# Patient Record
Sex: Female | Born: 1950 | Race: White | Hispanic: No | Marital: Married | Smoking: Former smoker
Health system: Southern US, Community
[De-identification: ages and names within clinical notes are randomized; demographics above are authoritative.]

## PROBLEM LIST (undated history)

## (undated) DIAGNOSIS — I255 Ischemic cardiomyopathy: Secondary | ICD-10-CM

## (undated) DIAGNOSIS — E119 Type 2 diabetes mellitus without complications: Secondary | ICD-10-CM

## (undated) DIAGNOSIS — I10 Essential (primary) hypertension: Secondary | ICD-10-CM

## (undated) DIAGNOSIS — R011 Cardiac murmur, unspecified: Secondary | ICD-10-CM

## (undated) DIAGNOSIS — M2550 Pain in unspecified joint: Secondary | ICD-10-CM

## (undated) DIAGNOSIS — I2510 Atherosclerotic heart disease of native coronary artery without angina pectoris: Secondary | ICD-10-CM

## (undated) DIAGNOSIS — G459 Transient cerebral ischemic attack, unspecified: Secondary | ICD-10-CM

## (undated) DIAGNOSIS — E785 Hyperlipidemia, unspecified: Secondary | ICD-10-CM

## (undated) DIAGNOSIS — I639 Cerebral infarction, unspecified: Secondary | ICD-10-CM

## (undated) DIAGNOSIS — K219 Gastro-esophageal reflux disease without esophagitis: Secondary | ICD-10-CM

## (undated) HISTORY — DX: Essential (primary) hypertension: I10

## (undated) HISTORY — DX: Gastro-esophageal reflux disease without esophagitis: K21.9

## (undated) HISTORY — DX: Ischemic cardiomyopathy: I25.5

## (undated) HISTORY — PX: CARDIAC CATHETERIZATION: SHX172

## (undated) HISTORY — DX: Type 2 diabetes mellitus without complications: E11.9

## (undated) HISTORY — DX: Atherosclerotic heart disease of native coronary artery without angina pectoris: I25.10

## (undated) HISTORY — DX: Cerebral infarction, unspecified: I63.9

## (undated) HISTORY — DX: Pain in unspecified joint: M25.50

## (undated) HISTORY — DX: Hyperlipidemia, unspecified: E78.5

## (undated) HISTORY — DX: Cardiac murmur, unspecified: R01.1

---
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
## 1986-12-12 HISTORY — PX: BREAST BIOPSY: SHX20

## 2006-06-15 ENCOUNTER — Emergency Department: Payer: Self-pay | Admitting: General Practice

## 2006-06-16 ENCOUNTER — Other Ambulatory Visit: Payer: Self-pay

## 2006-12-29 ENCOUNTER — Ambulatory Visit: Payer: Self-pay | Admitting: Internal Medicine

## 2011-01-14 ENCOUNTER — Ambulatory Visit: Payer: Self-pay | Admitting: Gastroenterology

## 2011-01-17 LAB — PATHOLOGY REPORT

## 2014-07-04 ENCOUNTER — Ambulatory Visit: Payer: Self-pay | Admitting: Internal Medicine

## 2014-07-22 LAB — URINALYSIS, COMPLETE
BACTERIA: NONE SEEN
BILIRUBIN, UR: NEGATIVE
Blood: NEGATIVE
Glucose,UR: NEGATIVE mg/dL (ref 0–75)
Ketone: NEGATIVE
NITRITE: NEGATIVE
Ph: 6 (ref 4.5–8.0)
Protein: NEGATIVE
Specific Gravity: 1.004 (ref 1.003–1.030)
Squamous Epithelial: 1

## 2014-07-22 LAB — COMPREHENSIVE METABOLIC PANEL
ALK PHOS: 61 U/L
ALT: 26 U/L
ANION GAP: 10 (ref 7–16)
Albumin: 3.6 g/dL (ref 3.4–5.0)
BILIRUBIN TOTAL: 0.2 mg/dL (ref 0.2–1.0)
BUN: 10 mg/dL (ref 7–18)
CALCIUM: 9.1 mg/dL (ref 8.5–10.1)
CO2: 26 mmol/L (ref 21–32)
Chloride: 107 mmol/L (ref 98–107)
Creatinine: 0.73 mg/dL (ref 0.60–1.30)
EGFR (African American): >60
GLUCOSE: 149 mg/dL — AB (ref 65–99)
OSMOLALITY: 287 (ref 275–301)
Potassium: 4.5 mmol/L (ref 3.5–5.1)
SGOT(AST): 25 U/L (ref 15–37)
SODIUM: 143 mmol/L (ref 136–145)
TOTAL PROTEIN: 6.9 g/dL (ref 6.4–8.2)

## 2014-07-22 LAB — PROTIME-INR
INR: 1
PROTHROMBIN TIME: 12.6 s (ref 11.5–14.7)

## 2014-07-22 LAB — CBC
HCT: 37.3 % (ref 35.0–47.0)
HGB: 12.3 g/dL (ref 12.0–16.0)
MCH: 29.2 pg (ref 26.0–34.0)
MCHC: 33 g/dL (ref 32.0–36.0)
MCV: 89 fL (ref 80–100)
Platelet: 190 10*3/uL (ref 150–440)
RBC: 4.21 10*6/uL (ref 3.80–5.20)
RDW: 13.9 % (ref 11.5–14.5)
WBC: 6.2 10*3/uL (ref 3.6–11.0)

## 2014-07-22 LAB — TSH: Thyroid Stimulating Horm: 4.41 u[IU]/mL

## 2014-07-22 LAB — CK TOTAL AND CKMB (NOT AT ARMC)
CK, TOTAL: 73 U/L
CK-MB: <0.5 ng/mL — ABNORMAL LOW (ref 0.5–3.6)

## 2014-07-22 LAB — D-DIMER(ARMC): D-Dimer: 264 ng/ml

## 2014-07-22 LAB — TROPONIN I

## 2014-07-23 ENCOUNTER — Observation Stay: Payer: Self-pay | Admitting: Internal Medicine

## 2014-07-23 DIAGNOSIS — R55 Syncope and collapse: Secondary | ICD-10-CM

## 2014-07-23 LAB — LIPID PANEL
Cholesterol: 258 mg/dL — ABNORMAL HIGH (ref 0–200)
HDL Cholesterol: 33 mg/dL — ABNORMAL LOW (ref 40–60)
TRIGLYCERIDES: 640 mg/dL — AB (ref 0–200)

## 2014-07-23 LAB — TROPONIN I: Troponin-I: <0.02 ng/mL

## 2014-07-23 LAB — CK-MB
CK-MB: <0.5 ng/mL — ABNORMAL LOW (ref 0.5–3.6)
CK-MB: <0.5 ng/mL — ABNORMAL LOW (ref 0.5–3.6)

## 2014-08-12 DIAGNOSIS — I639 Cerebral infarction, unspecified: Secondary | ICD-10-CM

## 2014-08-12 DIAGNOSIS — G459 Transient cerebral ischemic attack, unspecified: Secondary | ICD-10-CM

## 2014-08-12 HISTORY — DX: Transient cerebral ischemic attack, unspecified: G45.9

## 2014-08-12 HISTORY — DX: Cerebral infarction, unspecified: I63.9

## 2014-10-29 ENCOUNTER — Encounter: Payer: Self-pay | Admitting: General Surgery

## 2014-11-04 ENCOUNTER — Ambulatory Visit (INDEPENDENT_AMBULATORY_CARE_PROVIDER_SITE_OTHER): Payer: PRIVATE HEALTH INSURANCE | Admitting: General Surgery

## 2014-11-04 ENCOUNTER — Encounter: Payer: Self-pay | Admitting: General Surgery

## 2014-11-04 VITALS — BP 144/78 | HR 80 | Resp 12 | Ht 64.0 in | Wt 155.0 lb

## 2014-11-04 DIAGNOSIS — D4861 Neoplasm of uncertain behavior of right breast: Secondary | ICD-10-CM

## 2014-11-04 DIAGNOSIS — R928 Other abnormal and inconclusive findings on diagnostic imaging of breast: Secondary | ICD-10-CM

## 2014-11-04 NOTE — Patient Instructions (Addendum)
Stereotactic Breast Biopsy A stereotactic breast biopsy is a procedure in which mammography is used in the collection of a sample of breast tissue. Mammography is a type of X-ray exam of the breasts that produces an image called a mammogram. The mammogram allows your health care provider to precisely locate the area of the breast from which a tissue sample will be taken. The tissue is then examined under a microscope to see if cancerous cells are present. A breast biopsy is done when:   A lump, abnormality, or mass is seen in the breast on a breast X-ray (mammogram).   Small calcium deposits (calcifications) are seen in the breast.   The shape or appearance of the breasts changes.   The shape or appearance of the nipples changes. You may have unusual or bloody discharge coming from the nipples, or you may have crusting, retraction, or dimpling of the nipples. A breast biopsy can indicate if you need surgery or other treatment.  LET YOUR HEALTH CARE PROVIDER KNOW ABOUT:  Any allergies you have.  All medicines you are taking, including vitamins, herbs, eye drops, creams, and over-the-counter medicines.  Previous problems you or members of your family have had with the use of anesthetics.  Any blood disorders you have.  Previous surgeries you have had.  Medical conditions you have. RISKS AND COMPLICATIONS Generally, stereotactic breast biopsy is a safe procedure. However, as with any procedure, complications can occur. Possible complications include:  Infection at the needle-insertion site.   Bleeding or bruising after surgery.  The breast may become altered or deformed as a result of the procedure.  The needle may go through the chest wall into the lung area.  BEFORE THE PROCEDURE  Wear a supportive bra to the procedure.  You will be asked to remove jewelry, dentures, eyeglasses, metal objects, or clothing that might interfere with the X-ray images. You may want to leave  some of these objects at home.  Arrange for someone to drive you home after the procedure if desired. PROCEDURE  A stereotactic breast biopsy is done while you are awake. During the procedure, relax as much as possible. Let your health care provider know if you are uncomfortable, anxious, or in pain. Usually, the only discomfort felt during the procedure is caused by staying in one position for the length of the procedure. This discomfort can be reduced by carefully placed cushions. Most of the time the biopsy is done using a table with openings on it. You will be asked to lie facedown on the table and place your breasts through the openings. Your breast is compressed between metal plates to get good X-ray images. Your skin will be cleaned, and a numbing medicine (local anesthetic) will be injected. A small cut (incision) will be made in your breast. The tip of the biopsy needle will be directed through the incision. Several small pieces of suspicious tissue will be taken. Then, a final set of X-ray images will be obtained. If they show that the suspicious tissue has been mostly or completely removed, a small clip will be left at the biopsy site. This is done so that the biopsy site can be easily located if the results of the biopsy show that the tissue is cancerous.  After the procedure, the incision will be stitched (sutured) or taped and covered with a bandage (dressing). Your health care provider may apply a pressure dressing and an ice pack to prevent bleeding and swelling in the breast.  A stereotactic   breast biopsy can take 30 minutes or more. AFTER THE PROCEDURE  If you are doing well and have no problems, you will be allowed to go home.  Document Released: 08/27/2003 Document Revised: 12/03/2013 Document Reviewed: 06/27/2013 [facility] Patient Information 2015 [place], [place]. This information is not intended to replace advice given to you by your health care provider. Make sure you discuss any  questions you have with your health care provider.  Patient has been scheduled for a right breast stereotactic biopsy at [facility] for 11/10/14 at 3 pm. She will check-in at the [facility] at 2:30 pm. This patient is aware of date, time, and instructions. Patient verbalizes understanding.

## 2014-11-04 NOTE — Progress Notes (Signed)
Patient ID: Donna Harper, female   DOB: Nov 04, 1951, 63 y.o.   MRN: [id number]  Chief Complaint  Patient presents with  . Breast Problem    abnormal mammogram    HPI Donna Harper is a 63 y.o. female.  who presents for a breast evaluation. The most recent mammogram was done last week at [facility].  Patient does perform regular self breast checks and gets regular mammograms done at [facility].  Denies family history of breast cancer. She does state that last year she injured the right breast/chest on a file cabinet. She does states the right breast has an occasional dull ache for about 2 weeks.  She can feel a little some different in the right breast as well for about 2 weeks.  HPI  Past Medical History  Diagnosis Date  . Stroke Sept 2015    TIA  . Diabetes mellitus without complication   . GERD (gastroesophageal reflux disease)   . Joint pain   . Murmur     Past Surgical History  Procedure Laterality Date  . Cesarean section  1986  . Breast biopsy Bilateral 1988    benign/Dr Evette Cristal    Family History  Problem Relation Age of Onset  . COPD Mother   . COPD Father     Social History History  Substance Use Topics  . Smoking status: Former Games developer  . Smokeless tobacco: Never Used  . Alcohol Use: No    Allergies  Allergen Reactions  . Erythromycin     Stomach Problems'   . Levaquin [Levofloxacin In D5w]   . Penicillins     Heart races  . Tequin [Gatifloxacin]   . Zoloft [Sertraline Hcl]   . Zithromax [Azithromycin] Palpitations    Heart race    Current Outpatient Prescriptions  Medication Sig Dispense Refill  . aspirin 81 MG tablet Take 81 mg by mouth daily.    . cholecalciferol (VITAMIN D) 1000 UNITS tablet Take 1,000 Units by mouth daily.    . colesevelam (WELCHOL) 625 MG tablet Take 312.5 mg by mouth daily with breakfast.    . diphenhydrAMINE (BENADRYL) 25 MG tablet Take 25 mg by mouth at bedtime.    [name] GLIPIZIDE XL 2.5 MG 24 hr tablet Take 2.5 mg by mouth  daily with breakfast.   0  . losartan (COZAAR) 50 MG tablet Take 50 mg by mouth daily.   0  . metFORMIN (GLUCOPHAGE) 1000 MG tablet Take 1,000 mg by mouth 2 (two) times daily with a meal.   0  . NASONEX 50 MCG/ACT nasal spray Place 2 sprays into the nose daily.   0  . nystatin (MYCOSTATIN) 100000 UNIT/ML suspension Take 5 mLs by mouth as needed.     No current facility-administered medications for this visit.    Review of Systems Review of Systems  Constitutional: Negative.   Respiratory: Negative.   Cardiovascular: Negative.     Blood pressure 144/78, pulse 80, resp. rate 12, height 5\' 4"  (1.626 m), weight 155 lb (70.308 kg).  Physical Exam Physical Exam  Constitutional: She is oriented to person, place, and time. She appears well-developed and well-nourished.  Neck: Neck supple.  Cardiovascular: Normal rate and regular rhythm.   Murmur heard.  Systolic murmur is present with a grade of 1/6  Pulmonary/Chest: Effort normal and breath sounds normal. Right breast exhibits no inverted nipple, no mass, no nipple discharge, no skin change and no tenderness. Left breast exhibits no inverted nipple, no mass, no nipple discharge,  no skin change and no tenderness.    Focal thickening at 1 o'clock 6 CFN right breast.  Lymphadenopathy:    She has no cervical adenopathy.    She has no axillary adenopathy.  Neurological: She is alert and oriented to person, place, and time.  Skin: Skin is warm and dry.    Data Reviewed Bilateral mammogram and right breast ultrasound dated 10/28/2014 completed at [facility] were reviewed. Breasts were described as almost entirely fatty replaced. Left breast was unremarkable. Right breast showed focal thickening with multiple oil cyst up to 1 cm in diameter, these were superficially located. There was a 1.3 cm focal asymmetry in the upper medial breast that was not identified sonographically. BI-RADS-4.  Assessment    Likely soft tissue trauma secondary to the  significant fall experienced by the patient, unable to exclude malignancy based on present imaging.    Plan    The patient's exceedingly anxious, and biopsy is far more appropriate than observation. With the Thanksgiving holiday approaching, it will be possible to have this biopsied until next week.    The stereotactic procedure was reviewed with the patient. The potential for bleeding, infection and pain was reviewed. At this time, the benefits outweigh the risk, and the patient is amenable to proceed.  Patient has been scheduled for a right breast stereotactic biopsy at [facility] for 11/10/14 at 3 pm. She will check-in at the [facility] at 2:30 pm. This patient is aware of date, time, and instructions. Patient verbalizes understanding.   PCP:  Corky Downs Ref: Dr. Janann Colonel, Merrily Pew 11/06/2014, 6:39 AM

## 2014-11-06 DIAGNOSIS — R922 Inconclusive mammogram: Secondary | ICD-10-CM | POA: Insufficient documentation

## 2014-11-06 DIAGNOSIS — R928 Other abnormal and inconclusive findings on diagnostic imaging of breast: Secondary | ICD-10-CM | POA: Insufficient documentation

## 2014-11-10 ENCOUNTER — Ambulatory Visit: Payer: Self-pay | Admitting: General Surgery

## 2014-11-11 ENCOUNTER — Telehealth: Payer: Self-pay | Admitting: General Surgery

## 2014-11-11 NOTE — Telephone Encounter (Signed)
Notified pathology benign. Adipose tissue with some stromal prominence. Post biopsy mammograms confirmed area of question sampled.  Will f/u next week with the nurse, Repeat mammogram in six months at [facility].

## 2014-11-12 ENCOUNTER — Encounter: Payer: Self-pay | Admitting: General Surgery

## 2014-11-17 ENCOUNTER — Ambulatory Visit (INDEPENDENT_AMBULATORY_CARE_PROVIDER_SITE_OTHER): Payer: Self-pay | Admitting: *Deleted

## 2014-11-17 DIAGNOSIS — R928 Other abnormal and inconclusive findings on diagnostic imaging of breast: Secondary | ICD-10-CM

## 2014-11-17 NOTE — Progress Notes (Signed)
Patient here today for follow up post right breast biopsy.  Steristrip in place and aware it may come off in one week.  Minimal bruising noted.  The patient is aware that a heating pad may be used for comfort as needed.  Aware of pathology. Follow up as scheduled. 

## 2015-04-04 NOTE — H&P (Signed)
PATIENT NAME:  Donna Harper, Donna Harper MR#:  [id number] DATE OF BIRTH:  January 24, 1951  DATE OF ADMISSION:  07/23/2014   PRIMARY CARE PHYSICIAN:  Corky Downs, MD  REFERRING PHYSICIAN:  Sheryl L. Mindi Junker, MD  CHIEF COMPLAINT: Dizziness, blurred vision.   HISTORY OF PRESENT ILLNESS: Ms. Donna Harper is a 64 year old female with a history of diabetes mellitus, hypertension, and hyperlipidemia, who comes to the Emergency Department after having 2 episodes of blurred vision. The patient was eating her dinner when she started to experience some blurred vision, associated with some nausea. The symptoms quickly resolved in 15 minutes. In the evening, the patient was sitting in the chair and all of a sudden she started to experience blurred vision all around, and was only able to see the things which were in front of her. Again, the episode lasted for some time and resolved by itself. Concerning these symptoms, she came to the Emergency Department. The patient denies having any slurred speech or difficulty swallowing, or any weakness in any part of the body.   Workup in the Emergency Department, CT head without contrast, showed no acute intracranial abnormality. CBC and CMP were completely within normal limits. Cardiac enzymes x 1 set were negative. EKG, 12-lead: Normal sinus rhythm, with no ST-T wave abnormalities.   The patient denied having any previous episodes.   PAST MEDICAL HISTORY:  1.  Hypertension.  2.  Diabetes mellitus.  3.  Hyperlipidemia.   PAST SURGICAL HISTORY:  1.  Appendectomy.  2.  C-sections.  3.  Adenoidectomy and tonsillectomy.   ALLERGIES:  1.  ZOLOFT.  2.  PENICILLIN.  3.  AVANDIA.  4.  LIPITOR.  5.  AZITHROMYCIN.   HOME MEDICATIONS:  1.  Metformin.  2.  Lescol.  3.  Fish oil.  4.   Diovan.  5.  Aspirin.   SOCIAL HISTORY: No history of smoking, drinking alcohol or using illicit drugs. Married; lives with her husband.   FAMILY HISTORY: No obvious health problems run in the  family.   PHYSICAL EXAMINATION:  GENERAL: This is a well-built, well-nourished, age-appropriate female lying down in the bed, not in distress.  VITAL SIGNS: Temperature 98.3, pulse 96, blood pressure 146/81, respiratory rate of 20, oxygen saturation 97% on room air.  HEENT: Head normocephalic, atraumatic. No scleral icterus. Conjunctivae normal. Pupils equal and react to light. Mucous membranes moist. No pharyngeal erythema.  NECK: Supple. No lymphadenopathy. No JVD. No carotid bruit.  CHEST: No focal tenderness.  LUNGS: Bilaterally clear to auscultation.  HEART: S1, S2 regular. No murmurs are heard.  ABDOMEN: Bowel sounds present. Soft, nontender, nondistended.  EXTREMITIES: No pedal edema. Pulses 2+.  NEUROLOGIC: The patient is alert, oriented to place, person, and time. Cranial nerves II through XII intact. Motor 5/5 in the upper and lower extremities.   ASSESSMENT AND PLAN: [name] is a 64 year old female who comes with tunnel vision and blurry vision.  1.  Transient ischemic attack. Will obtain MRA of the brain, echocardiogram, and carotid Dopplers. The patient's risk factors are diabetes mellitus, hypertension and age. Keep the patient on aspirin. Obtain lipid profile. If workup is negative, the patient could be discharged home.  2.  Diabetes mellitus. Hold metformin for now. Continue with sliding scale insulin.  3.  Hypertension. Continue the Diovan. 4.  Keep the patient on deep vein thrombosis prophylaxis with Lovenox.   TIME SPENT: 50 minutes    ____________________________ Susa Griffins, MD pv:MT D: 07/23/2014 02:15:10 ET T: 07/23/2014 05:20:24 ET JOB#: [id number]  cc: Susa Griffins, MD, <Dictator> Corky Downs, MD Susa Griffins MD ELECTRONICALLY SIGNED 07/30/2014 21:32

## 2015-04-04 NOTE — Discharge Summary (Signed)
PATIENT NAME:  Donna Harper, Donna Harper MR#:  [id number] DATE OF BIRTH:  [birth year]  DATE OF ADMISSION:  07/23/2014 DATE OF DISCHARGE:  07/23/2014  PRESENTING COMPLAINT: Blurred vision.   DISCHARGE DIAGNOSES: 1.  Blurred vision, resolved, suspected transient ischemic attack.  2.  Hypertension.  3.  Type 2 diabetes.   CODE STATUS:  Full code.  DISCHARGE MEDICATIONS:   1.  Metformin 1000 mg p.o. daily b.i.d.  2.  Nasonex 50 mcg/inhalations 2 sprays daily at bedtime.  3.  Glipizide XL 2.5 mg extended release daily.  4.  Losartan 50 mg p.o. daily,  5.  Welchol 625 mg 2 tablets 3 times a day.  6.  Aspirin 81 mg daily.   DISCHARGE DIET: Low-sodium, carbohydrate -controlled diet.   FOLLOWUP:   1.  Follow up with Dr. Lavera Guise in 1-2 weeks.  2.  The patient advised to follow up by doctor if her symptoms continue to recur.    LABORATORY AND RADIOLOGY DATA:  Ultrasound carotid Doppler showed mild plaque at both carotid bifurcation, right greater than left. No significant carotid stenosis identified with estimated bilateral ICA stenosis of less than 50%. Troponin x3 negative.   Echo Doppler showed EF of 55% to 60%. Normal left ventricular global systolic function, impaired, relaxation pattern of LV diastolic filling. Normal right ventricular size and systolic function.   Cholesterol is 258, triglycerides 640, LDL is unable to report due to elevated triglycerides.   CT of the head shows no acute intracranial process.   CBC and comprehensive metabolic panel within normal limits.   BRIEF SUMMARY OF HOSPITAL COURSE:  Donna Harper is a 64 year old Caucasian female with history of hypertension and diabetes came into the Emergency Room after she started having some blurred vision. She was admitted with:  1.  Blurred vision, which is likely suspected due to transient ischemic attack. She was started on aspirin.  She is unable to do MRI and MRA of the brain due to claustrophobia. Offered IV Ativan, however,  the patient is not wanting to do it. Echo Doppler and carotid Doppler results as above were noted.  No neuro deficits were noted. The patient advised to keep eye doctor appointment to see the eye doctor if her symptoms recur.  2.  Type 2 diabetes, resumed home medications.  3.  Hypertension. Continue Diovan.   CONDITION:  Hospital stay otherwise remained stable.   DISCHARGE PLAN:  Discussed with the patient and her family members.   TIME SPENT: 40 minutes.   ____________________________ Hart Rochester Posey Pronto, MD sap:DT D: 07/24/2014 13:39:40 ET T: 07/24/2014 15:13:50 ET JOB#: [id number]  cc: [name], MD, <Dictator> Cletis Athens, MD Ilda Basset MD ELECTRONICALLY SIGNED 08/01/2014 14:45

## 2015-04-06 LAB — SURGICAL PATHOLOGY

## 2016-06-29 DIAGNOSIS — E119 Type 2 diabetes mellitus without complications: Secondary | ICD-10-CM | POA: Diagnosis not present

## 2016-06-29 DIAGNOSIS — I10 Essential (primary) hypertension: Secondary | ICD-10-CM | POA: Diagnosis not present

## 2016-06-29 DIAGNOSIS — E784 Other hyperlipidemia: Secondary | ICD-10-CM | POA: Diagnosis not present

## 2016-06-29 DIAGNOSIS — J019 Acute sinusitis, unspecified: Secondary | ICD-10-CM | POA: Diagnosis not present

## 2016-07-14 DIAGNOSIS — H43811 Vitreous degeneration, right eye: Secondary | ICD-10-CM | POA: Diagnosis not present

## 2017-03-01 DIAGNOSIS — J209 Acute bronchitis, unspecified: Secondary | ICD-10-CM | POA: Diagnosis not present

## 2017-03-01 DIAGNOSIS — M720 Palmar fascial fibromatosis [Dupuytren]: Secondary | ICD-10-CM | POA: Diagnosis not present

## 2017-03-01 DIAGNOSIS — Z880 Allergy status to penicillin: Secondary | ICD-10-CM | POA: Diagnosis not present

## 2017-03-01 DIAGNOSIS — M65319 Trigger thumb, unspecified thumb: Secondary | ICD-10-CM | POA: Diagnosis not present

## 2017-03-07 DIAGNOSIS — M65311 Trigger thumb, right thumb: Secondary | ICD-10-CM | POA: Diagnosis not present

## 2017-03-07 DIAGNOSIS — M720 Palmar fascial fibromatosis [Dupuytren]: Secondary | ICD-10-CM | POA: Diagnosis not present

## 2017-03-27 DIAGNOSIS — M65311 Trigger thumb, right thumb: Secondary | ICD-10-CM | POA: Diagnosis not present

## 2017-05-11 DIAGNOSIS — M65311 Trigger thumb, right thumb: Secondary | ICD-10-CM | POA: Diagnosis not present

## 2017-08-21 DIAGNOSIS — M65342 Trigger finger, left ring finger: Secondary | ICD-10-CM | POA: Diagnosis not present

## 2017-09-11 DIAGNOSIS — J209 Acute bronchitis, unspecified: Secondary | ICD-10-CM | POA: Diagnosis not present

## 2017-09-11 DIAGNOSIS — J399 Disease of upper respiratory tract, unspecified: Secondary | ICD-10-CM | POA: Diagnosis not present

## 2017-09-11 DIAGNOSIS — E119 Type 2 diabetes mellitus without complications: Secondary | ICD-10-CM | POA: Diagnosis not present

## 2017-09-11 DIAGNOSIS — J00 Acute nasopharyngitis [common cold]: Secondary | ICD-10-CM | POA: Diagnosis not present

## 2017-10-03 DIAGNOSIS — Z23 Encounter for immunization: Secondary | ICD-10-CM | POA: Diagnosis not present

## 2017-11-21 DIAGNOSIS — M65342 Trigger finger, left ring finger: Secondary | ICD-10-CM | POA: Diagnosis not present

## 2018-01-06 DIAGNOSIS — J0100 Acute maxillary sinusitis, unspecified: Secondary | ICD-10-CM | POA: Diagnosis not present

## 2018-03-20 DIAGNOSIS — M654 Radial styloid tenosynovitis [de Quervain]: Secondary | ICD-10-CM | POA: Diagnosis not present

## 2018-03-20 DIAGNOSIS — M65341 Trigger finger, right ring finger: Secondary | ICD-10-CM | POA: Diagnosis not present

## 2018-05-01 DIAGNOSIS — M65342 Trigger finger, left ring finger: Secondary | ICD-10-CM | POA: Diagnosis not present

## 2018-05-01 DIAGNOSIS — M65341 Trigger finger, right ring finger: Secondary | ICD-10-CM | POA: Diagnosis not present

## 2018-05-14 DIAGNOSIS — H10509 Unspecified blepharoconjunctivitis, unspecified eye: Secondary | ICD-10-CM | POA: Diagnosis not present

## 2018-05-14 DIAGNOSIS — E119 Type 2 diabetes mellitus without complications: Secondary | ICD-10-CM | POA: Diagnosis not present

## 2018-05-14 DIAGNOSIS — J399 Disease of upper respiratory tract, unspecified: Secondary | ICD-10-CM | POA: Diagnosis not present

## 2018-05-14 DIAGNOSIS — J209 Acute bronchitis, unspecified: Secondary | ICD-10-CM | POA: Diagnosis not present

## 2018-06-21 DIAGNOSIS — M65341 Trigger finger, right ring finger: Secondary | ICD-10-CM | POA: Diagnosis not present

## 2018-07-05 DIAGNOSIS — M65342 Trigger finger, left ring finger: Secondary | ICD-10-CM | POA: Diagnosis not present

## 2018-07-24 DIAGNOSIS — M720 Palmar fascial fibromatosis [Dupuytren]: Secondary | ICD-10-CM | POA: Diagnosis not present

## 2018-07-24 DIAGNOSIS — Z0000 Encounter for general adult medical examination without abnormal findings: Secondary | ICD-10-CM | POA: Diagnosis not present

## 2018-07-24 DIAGNOSIS — E119 Type 2 diabetes mellitus without complications: Secondary | ICD-10-CM | POA: Diagnosis not present

## 2018-07-24 DIAGNOSIS — E785 Hyperlipidemia, unspecified: Secondary | ICD-10-CM | POA: Diagnosis not present

## 2018-08-07 DIAGNOSIS — E7849 Other hyperlipidemia: Secondary | ICD-10-CM | POA: Diagnosis not present

## 2018-08-07 DIAGNOSIS — I10 Essential (primary) hypertension: Secondary | ICD-10-CM | POA: Diagnosis not present

## 2018-08-07 DIAGNOSIS — R5381 Other malaise: Secondary | ICD-10-CM | POA: Diagnosis not present

## 2018-08-30 DIAGNOSIS — Z23 Encounter for immunization: Secondary | ICD-10-CM | POA: Diagnosis not present

## 2018-08-30 DIAGNOSIS — E785 Hyperlipidemia, unspecified: Secondary | ICD-10-CM | POA: Diagnosis not present

## 2018-08-30 DIAGNOSIS — I10 Essential (primary) hypertension: Secondary | ICD-10-CM | POA: Diagnosis not present

## 2018-08-30 DIAGNOSIS — E119 Type 2 diabetes mellitus without complications: Secondary | ICD-10-CM | POA: Diagnosis not present

## 2018-08-30 DIAGNOSIS — M720 Palmar fascial fibromatosis [Dupuytren]: Secondary | ICD-10-CM | POA: Diagnosis not present

## 2019-07-29 ENCOUNTER — Other Ambulatory Visit: Payer: Self-pay

## 2019-07-29 ENCOUNTER — Inpatient Hospital Stay
Admission: EM | Admit: 2019-07-29 | Discharge: 2019-07-31 | DRG: 247 | Disposition: A | Discharge status: Home / Self Care | Payer: Medicare Other | Attending: Internal Medicine | Admitting: Internal Medicine

## 2019-07-29 ENCOUNTER — Encounter: Payer: Self-pay | Admitting: Emergency Medicine

## 2019-07-29 ENCOUNTER — Emergency Department: Payer: Medicare Other

## 2019-07-29 DIAGNOSIS — I255 Ischemic cardiomyopathy: Secondary | ICD-10-CM | POA: Diagnosis present

## 2019-07-29 DIAGNOSIS — E785 Hyperlipidemia, unspecified: Secondary | ICD-10-CM | POA: Diagnosis present

## 2019-07-29 DIAGNOSIS — I25110 Atherosclerotic heart disease of native coronary artery with unstable angina pectoris: Secondary | ICD-10-CM | POA: Diagnosis present

## 2019-07-29 DIAGNOSIS — Z881 Allergy status to other antibiotic agents status: Secondary | ICD-10-CM

## 2019-07-29 DIAGNOSIS — Z8249 Family history of ischemic heart disease and other diseases of the circulatory system: Secondary | ICD-10-CM

## 2019-07-29 DIAGNOSIS — I10 Essential (primary) hypertension: Secondary | ICD-10-CM | POA: Diagnosis present

## 2019-07-29 DIAGNOSIS — Z23 Encounter for immunization: Secondary | ICD-10-CM

## 2019-07-29 DIAGNOSIS — Z888 Allergy status to other drugs, medicaments and biological substances status: Secondary | ICD-10-CM

## 2019-07-29 DIAGNOSIS — I214 Non-ST elevation (NSTEMI) myocardial infarction: Secondary | ICD-10-CM | POA: Diagnosis not present

## 2019-07-29 DIAGNOSIS — Z880 Allergy status to penicillin: Secondary | ICD-10-CM

## 2019-07-29 DIAGNOSIS — R079 Chest pain, unspecified: Secondary | ICD-10-CM | POA: Diagnosis present

## 2019-07-29 DIAGNOSIS — E119 Type 2 diabetes mellitus without complications: Secondary | ICD-10-CM | POA: Diagnosis present

## 2019-07-29 DIAGNOSIS — Z87891 Personal history of nicotine dependence: Secondary | ICD-10-CM

## 2019-07-29 DIAGNOSIS — Z7984 Long term (current) use of oral hypoglycemic drugs: Secondary | ICD-10-CM

## 2019-07-29 DIAGNOSIS — Z7982 Long term (current) use of aspirin: Secondary | ICD-10-CM

## 2019-07-29 DIAGNOSIS — I200 Unstable angina: Secondary | ICD-10-CM

## 2019-07-29 DIAGNOSIS — K219 Gastro-esophageal reflux disease without esophagitis: Secondary | ICD-10-CM | POA: Diagnosis present

## 2019-07-29 DIAGNOSIS — Z825 Family history of asthma and other chronic lower respiratory diseases: Secondary | ICD-10-CM

## 2019-07-29 DIAGNOSIS — Z8673 Personal history of transient ischemic attack (TIA), and cerebral infarction without residual deficits: Secondary | ICD-10-CM

## 2019-07-29 DIAGNOSIS — Z20828 Contact with and (suspected) exposure to other viral communicable diseases: Secondary | ICD-10-CM | POA: Diagnosis present

## 2019-07-29 LAB — LIPID PANEL
Cholesterol: 264 mg/dL — ABNORMAL HIGH (ref 0–200)
HDL: 47 mg/dL (ref >40)
LDL Cholesterol: 143 mg/dL — ABNORMAL HIGH (ref 0–99)
Total CHOL/HDL Ratio: 5.6 RATIO
Triglycerides: 372 mg/dL — ABNORMAL HIGH (ref <150)
VLDL: 74 mg/dL — ABNORMAL HIGH (ref 0–40)

## 2019-07-29 LAB — GLUCOSE, CAPILLARY: Glucose-Capillary: 99 mg/dL (ref 70–99)

## 2019-07-29 LAB — CBC
HCT: 35.8 % — ABNORMAL LOW (ref 36.0–46.0)
Hemoglobin: 12.2 g/dL (ref 12.0–15.0)
MCH: 29.9 pg (ref 26.0–34.0)
MCHC: 34.1 g/dL (ref 30.0–36.0)
MCV: 87.7 fL (ref 80.0–100.0)
Platelets: 182 10*3/uL (ref 150–400)
RBC: 4.08 MIL/uL (ref 3.87–5.11)
RDW: 13.2 % (ref 11.5–15.5)
WBC: 6.4 10*3/uL (ref 4.0–10.5)
nRBC: 0 % (ref 0.0–0.2)

## 2019-07-29 LAB — BASIC METABOLIC PANEL
Anion gap: 10 (ref 5–15)
BUN: 17 mg/dL (ref 8–23)
CO2: 21 mmol/L — ABNORMAL LOW (ref 22–32)
Calcium: 10.4 mg/dL — ABNORMAL HIGH (ref 8.9–10.3)
Chloride: 106 mmol/L (ref 98–111)
Creatinine, Ser: 0.73 mg/dL (ref 0.44–1.00)
GFR calc Af Amer: >60 mL/min (ref >60)
GFR calc non Af Amer: >60 mL/min (ref >60)
Glucose, Bld: 94 mg/dL (ref 70–99)
Potassium: 3.9 mmol/L (ref 3.5–5.1)
Sodium: 137 mmol/L (ref 135–145)

## 2019-07-29 LAB — TROPONIN I (HIGH SENSITIVITY)
Troponin I (High Sensitivity): 160 ng/L (ref <18)
Troponin I (High Sensitivity): 193 ng/L (ref <18)
Troponin I (High Sensitivity): 375 ng/L (ref <18)

## 2019-07-29 LAB — PROTIME-INR
INR: 1 (ref 0.8–1.2)
Prothrombin Time: 13.4 seconds (ref 11.4–15.2)

## 2019-07-29 LAB — APTT: aPTT: 26 seconds (ref 24–36)

## 2019-07-29 LAB — SARS CORONAVIRUS 2 BY RT PCR (HOSPITAL ORDER, PERFORMED IN ~~LOC~~ HOSPITAL LAB): SARS Coronavirus 2: NEGATIVE

## 2019-07-29 MED ORDER — INSULIN ASPART 100 UNIT/ML ~~LOC~~ SOLN
0.0000 [IU] | Freq: Three times a day (TID) | SUBCUTANEOUS | Status: DC
  Administered 2019-07-30 – 2019-07-31 (×2): 3 [IU] via SUBCUTANEOUS
  Administered 2019-07-31: 13:00:00 8 [IU] via SUBCUTANEOUS
  Filled 2019-07-29 (×3): qty 1

## 2019-07-29 MED ORDER — ASPIRIN 81 MG PO CHEW
324.0000 mg | CHEWABLE_TABLET | Freq: Once | ORAL | Status: AC
  Administered 2019-07-29: 19:00:00 324 mg via ORAL
  Filled 2019-07-29: qty 4

## 2019-07-29 MED ORDER — METOPROLOL TARTRATE 25 MG PO TABS
25.0000 mg | ORAL_TABLET | Freq: Two times a day (BID) | ORAL | Status: DC
  Administered 2019-07-30 – 2019-07-31 (×2): 25 mg via ORAL
  Filled 2019-07-29 (×2): qty 1

## 2019-07-29 MED ORDER — ONDANSETRON HCL 4 MG/2ML IJ SOLN: 4.0000 mg | Freq: Four times a day (QID) | INTRAMUSCULAR | Status: DC | PRN

## 2019-07-29 MED ORDER — LOSARTAN POTASSIUM 50 MG PO TABS
100.0000 mg | ORAL_TABLET | Freq: Every day | ORAL | Status: DC
  Administered 2019-07-31: 100 mg via ORAL
  Filled 2019-07-29: qty 2

## 2019-07-29 MED ORDER — ASPIRIN EC 81 MG PO TBEC
81.0000 mg | DELAYED_RELEASE_TABLET | Freq: Every day | ORAL | Status: DC
  Administered 2019-07-30 – 2019-07-31 (×2): 81 mg via ORAL
  Filled 2019-07-29 (×2): qty 1

## 2019-07-29 MED ORDER — INSULIN ASPART 100 UNIT/ML ~~LOC~~ SOLN
0.0000 [IU] | Freq: Every day | SUBCUTANEOUS | Status: DC
  Administered 2019-07-30: 2 [IU] via SUBCUTANEOUS
  Filled 2019-07-29: qty 1

## 2019-07-29 MED ORDER — HEPARIN (PORCINE) 25000 UT/250ML-% IV SOLN
950.0000 [IU]/h | INTRAVENOUS | Status: DC
  Administered 2019-07-29: 800 [IU]/h via INTRAVENOUS
  Filled 2019-07-29: qty 250

## 2019-07-29 MED ORDER — ACETAMINOPHEN 325 MG PO TABS: 650.0000 mg | ORAL_TABLET | ORAL | Status: DC | PRN

## 2019-07-29 MED ORDER — NITROGLYCERIN 2 % TD OINT
0.5000 [in_us] | TOPICAL_OINTMENT | Freq: Once | TRANSDERMAL | Status: AC
  Administered 2019-07-29: 0.5 [in_us] via TOPICAL
  Filled 2019-07-29: qty 1

## 2019-07-29 MED ORDER — COLESEVELAM HCL 625 MG PO TABS
312.5000 mg | ORAL_TABLET | Freq: Every day | ORAL | Status: DC
  Administered 2019-07-31: 312.5 mg via ORAL
  Filled 2019-07-29 (×2): qty 0.5

## 2019-07-29 MED ORDER — METFORMIN HCL 500 MG PO TABS: 1000.0000 mg | ORAL_TABLET | Freq: Two times a day (BID) | ORAL | Status: DC

## 2019-07-29 MED ORDER — HEPARIN BOLUS VIA INFUSION
4000.0000 [IU] | Freq: Once | INTRAVENOUS | Status: AC
  Administered 2019-07-29: 4000 [IU] via INTRAVENOUS
  Filled 2019-07-29: qty 4000

## 2019-07-29 MED ORDER — FLUTICASONE PROPIONATE 50 MCG/ACT NA SUSP
1.0000 | Freq: Every day | NASAL | Status: DC
  Filled 2019-07-29: qty 16

## 2019-07-29 MED ORDER — GLIPIZIDE ER 5 MG PO TB24
5.0000 mg | ORAL_TABLET | Freq: Every day | ORAL | Status: DC
  Filled 2019-07-29: qty 1

## 2019-07-29 NOTE — Progress Notes (Signed)
ANTICOAGULATION CONSULT NOTE - Initial Consult  Pharmacy Consult for Heparin  Indication: chest pain/ACS  Allergies  Allergen Reactions  . Erythromycin     Stomach Problems'   . Levaquin [Levofloxacin In D5w]   . Penicillins     Heart races  . Tequin [Gatifloxacin]   . Zoloft [Sertraline Hcl]   . Zithromax [Azithromycin] Palpitations    Heart race    Patient Measurements: Height: 5\' 3"  (160 cm) Weight: 150 lb (68 kg) IBW/kg (Calculated) : 52.4 Heparin Dosing Weight:  66.3 kg   Vital Signs: Temp: 98.1 F (36.7 C) (08/17 1706) Temp Source: Oral (08/17 1706) BP: 157/76 (08/17 1900) Pulse Rate: 93 (08/17 1900)  Labs: Recent Labs    07/29/19 1712 07/29/19 1857  HGB 12.2  --   HCT 35.8*  --   PLT 182  --   CREATININE 0.73  --   TROPONINIHS 160* 193*    Estimated Creatinine Clearance: 62.3 mL/min (by C-G formula based on SCr of 0.73 mg/dL).   Medical History: Past Medical History:  Diagnosis Date  . Diabetes mellitus without complication ([place])   . GERD (gastroesophageal reflux disease)   . Joint pain   . Murmur   . Stroke [facility]) Sept 2015   TIA    Medications:  (Not in a hospital admission)   Assessment: Pharmacy consulted to dose heparin in this 68 year old female admitted with ACS/NSTEMI.  No prior anticoag noted. CrCl = 62.3 ml/min  Goal of Therapy:  Heparin level 0.3-0.7 units/ml Monitor platelets by anticoagulation protocol: Yes   Plan:  Give 4000 units bolus x 1 Start heparin infusion at 800 units/hr Check anti-Xa level in 6 hours and daily while on heparin Continue to monitor H&H and platelets  Romeo Zielinski D 07/29/2019,7:47 PM

## 2019-07-29 NOTE — ED Triage Notes (Signed)
Pt c/o intermittent left side pain that radiates from left jaw to left chest.  Has been present for 3 days.  Also has had SHOB.  Denies nausea.  Has had some diaphoresis.  Pt ambulatory in triage. VSS.

## 2019-07-29 NOTE — Progress Notes (Signed)
Pnt refused metoprolol, educated on reason for medication but pnt is alert and oriented and asked for her vital signs which were given to pnt.   Pnt said if her HR increases she will take it. Will continue to monitor chest pain and HR. Pnt is on telemetry.

## 2019-07-29 NOTE — ED Provider Notes (Signed)
[facility] Emergency Department Provider Note    First MD Initiated Contact with Patient 07/29/19 1853     (approximate)  I have reviewed the triage vital signs and the nursing notes.   HISTORY  Chief Complaint Chest Pain    HPI Donna Harper is a 68 y.o. female presents the ER for evaluation of midsternal chest pain and pressure with burning radiation into her left jaw.  First noted the pain and discomfort when she was mowing the lawn on Thursday.  Since then has had multiple episodes of similar symptoms.  Has had some diaphoresis associated with that.  Did not come to the ER over the weekend because she felt it could wait but he was having persistent symptoms today.  Does have remote history of smoking.  Has a history of hypertension and diabetes.    Past Medical History:  Diagnosis Date   Diabetes mellitus without complication (HCC)    GERD (gastroesophageal reflux disease)    Joint pain    Murmur    Stroke [facility]) Sept 2015   TIA   Family History  Problem Relation Age of Onset   COPD Mother    COPD Father    Past Surgical History:  Procedure Laterality Date   BREAST BIOPSY Bilateral 1988   benign/Dr [place]   Patient Active Problem List   Diagnosis Date Noted   Breast density 11/06/2014   Abnormal mammogram of right breast 11/06/2014      Prior to Admission medications   Medication Sig Start Date End Date Taking? Authorizing Provider  aspirin 81 MG tablet Take 81 mg by mouth daily.    [provider]  cholecalciferol (VITAMIN D) 1000 UNITS tablet Take 1,000 Units by mouth daily.    [provider]  colesevelam (WELCHOL) 625 MG tablet Take 312.5 mg by mouth daily with breakfast.    [provider]  diphenhydrAMINE (BENADRYL) 25 MG tablet Take 25 mg by mouth at bedtime.    [provider]  GLIPIZIDE XL 2.5 MG 24 hr tablet Take 2.5 mg by mouth daily with breakfast.   11/02/14   [provider]  losartan (COZAAR) 50 MG tablet Take 50 mg by mouth daily.  09/25/14   [provider]  metFORMIN (GLUCOPHAGE) 1000 MG tablet Take 1,000 mg by mouth 2 (two) times daily with a meal.  11/02/14   [provider]  NASONEX 50 MCG/ACT nasal spray Place 2 sprays into the nose daily.  10/29/14   [provider]  nystatin (MYCOSTATIN) 100000 UNIT/ML suspension Take 5 mLs by mouth as needed.    [provider]    Allergies Erythromycin, Levaquin [levofloxacin in d5w], Penicillins, Tequin [gatifloxacin], Zoloft [sertraline hcl], and Zithromax [azithromycin]    Social History Social History   Tobacco Use   Smoking status: Former Smoker   Smokeless tobacco: Never Used  Substance Use Topics   Alcohol use: No    Alcohol/week: 0.0 standard drinks   Drug use: No    Review of Systems Patient denies headaches, rhinorrhea, blurry vision, numbness, shortness of breath, chest pain, edema, cough, abdominal pain, nausea, vomiting, diarrhea, dysuria, fevers, rashes or hallucinations unless otherwise stated above in HPI. ____________________________________________   PHYSICAL EXAM:  VITAL SIGNS: Vitals:   07/29/19 1706 07/29/19 1900  BP: (!) 163/81 (!) 157/76  Pulse: 90 93  Resp: 18 (!) 22  Temp: 98.1 F (36.7 C)   SpO2: 96% 95%  Constitutional: Alert and oriented.  Eyes: Conjunctivae are normal.  Head: Atraumatic. Nose: No congestion/rhinnorhea. Mouth/Throat: Mucous membranes are moist.   Neck: No stridor. Painless ROM.  Cardiovascular: Normal rate, regular rhythm. Grossly normal heart sounds.  Good peripheral circulation. Respiratory: Normal respiratory effort.  No retractions. Lungs CTAB. Gastrointestinal: Soft and nontender. No distention. No abdominal bruits. No CVA tenderness. Genitourinary:  Musculoskeletal: No lower extremity tenderness nor edema.  No joint effusions. Neurologic:  Normal speech and  language. No gross focal neurologic deficits are appreciated. No facial droop Skin:  Skin is warm, dry and intact. No rash noted. Psychiatric: Mood and affect are normal. Speech and behavior are normal.  ____________________________________________   LABS (all labs ordered are listed, but only abnormal results are displayed)  Results for orders placed or performed during the hospital encounter of 07/29/19 (from the past 24 hour(s))  Basic metabolic panel     Status: Abnormal   Collection Time: 07/29/19  5:12 PM  Result Value Ref Range   Sodium 137 135 - 145 mmol/L   Potassium 3.9 3.5 - 5.1 mmol/L   Chloride 106 98 - 111 mmol/L   CO2 21 (L) 22 - 32 mmol/L   Glucose, Bld 94 70 - 99 mg/dL   BUN 17 8 - 23 mg/dL   Creatinine, Ser 0.73 0.44 - 1.00 mg/dL   Calcium 10.4 (H) 8.9 - 10.3 mg/dL   GFR calc non Af Amer >60 >60 mL/min   GFR calc Af Amer >60 >60 mL/min   Anion gap 10 5 - 15  CBC     Status: Abnormal   Collection Time: 07/29/19  5:12 PM  Result Value Ref Range   WBC 6.4 4.0 - 10.5 K/uL   RBC 4.08 3.87 - 5.11 MIL/uL   Hemoglobin 12.2 12.0 - 15.0 g/dL   HCT 35.8 (L) 36.0 - 46.0 %   MCV 87.7 80.0 - 100.0 fL   MCH 29.9 26.0 - 34.0 pg   MCHC 34.1 30.0 - 36.0 g/dL   RDW 13.2 11.5 - 15.5 %   Platelets 182 150 - 400 K/uL   nRBC 0.0 0.0 - 0.2 %  Troponin I (High Sensitivity)     Status: Abnormal   Collection Time: 07/29/19  5:12 PM  Result Value Ref Range   Troponin I (High Sensitivity) 160 (HH) <18 ng/L  Troponin I (High Sensitivity)     Status: Abnormal   Collection Time: 07/29/19  6:57 PM  Result Value Ref Range   Troponin I (High Sensitivity) 193 (HH) <18 ng/L   ____________________________________________  EKG My review and personal interpretation at Time: 17:05   Indication: chest pain  Rate: 80  Rhythm: sinus Axis: normal Other: normal interals, no stemi ____________________________________________  RADIOLOGY  I personally reviewed all radiographic images ordered  to evaluate for the above acute complaints and reviewed radiology reports and findings.  These findings were personally discussed with the patient.  Please see medical record for radiology report.  ____________________________________________   PROCEDURES  Procedure(s) performed:  .Critical Care Performed by: Merlyn Lot, MD Authorized by: Merlyn Lot, MD   Critical care provider statement:    Critical care time (minutes):  35   Critical care time was exclusive of:  Separately billable procedures and treating other patients   Critical care was necessary to treat or prevent imminent or life-threatening deterioration of the following conditions:  Cardiac failure   Critical care was time spent personally by me on the following activities:  Development of treatment  plan with patient or surrogate, discussions with consultants, evaluation of patient's response to treatment, examination of patient, obtaining history from patient or surrogate, ordering and performing treatments and interventions, ordering and review of laboratory studies, ordering and review of radiographic studies, pulse oximetry, re-evaluation of patient's condition and review of old charts      Critical Care performed: yes ____________________________________________   INITIAL IMPRESSION / ASSESSMENT AND PLAN / ED COURSE  Pertinent labs & imaging results that were available during my care of the patient were reviewed by me and considered in my medical decision making (see chart for details).   DDX: ACS, pericarditis, esophagitis, boerhaaves, pe, dissection, pna, bronchitis, costochondritis   Anevay P Shiflet is a 68 y.o. who presents to the ED with symptoms as described above.  Patient with history concerning for unstable angina especially with troponin elevation.  Does not seem consistent with dissection or PE.  Abdominal exam soft and benign.  The patient will be placed on continuous pulse oximetry and  telemetry for monitoring.  Laboratory evaluation will be sent to evaluate for the above complaints.     Clinical Course as of Jul 28 2014  Mon Jul 29, 2019  2014 Discussed case with Dr. Curt Bears of cardiology who agrees with plan.  Patient be admitted to hospital for further medical management.   [PR]    Clinical Course User Index [PR] Merlyn Lot, MD    The patient was evaluated in Emergency Department today for the symptoms described in the history of present illness. He/she was evaluated in the context of the global COVID-19 pandemic, which necessitated consideration that the patient might be at risk for infection with the SARS-CoV-2 virus that causes COVID-19. Institutional protocols and algorithms that pertain to the evaluation of patients at risk for COVID-19 are in a state of rapid change based on information released by regulatory bodies including the CDC and federal and state organizations. These policies and algorithms were followed during the patient's care in the ED.  As part of my medical decision making, I reviewed the following data within the [facility] notes reviewed and incorporated, Labs reviewed, notes from prior ED visits and [place] State Controlled Substance Database   ____________________________________________   FINAL CLINICAL IMPRESSION(S) / ED DIAGNOSES  Final diagnoses:  Unstable angina ([place])      NEW MEDICATIONS STARTED DURING THIS VISIT:  New Prescriptions   No medications on file     Note:  This document was prepared using Dragon voice recognition software and may include unintentional dictation errors.    Merlyn Lot, MD 07/29/19 2015

## 2019-07-29 NOTE — H&P (Signed)
[facility] [place]   PATIENT NAME: Donna Harper    MR#:  [id number]  DATE OF BIRTH:  [birth year]  DATE OF ADMISSION:  07/29/2019  PRIMARY CARE PHYSICIAN: Corky Downs, MD   REQUESTING/REFERRING PHYSICIAN: Willy Eddy, MD  CHIEF COMPLAINT:   Chief Complaint  Patient presents with  . Chest Pain    HISTORY OF PRESENT ILLNESS:  Donna Harper  is a 68 y.o. female with a known history of diabetes mellitus, GERD, TIA.  She presented to the emergency room for evaluation of midsternal chest pain radiating to her left shoulder and upper arm as well as her left jaw.  Pain began while she was mowing her lawn on Thursday.  She has experienced multiple episodes since that time usually lasting less than 15 minutes with a pain score 6-8 out of 10.  She notes associated shortness of breath and nausea however no diaphoresis.  She has no known history of MI.  However she has a family history with her father having had coronary artery bypass surgery and valve replacement.  On arrival to the emergency room troponin is 193.  No significant EKG changes are noted.  Cardiology was consulted and patient placed on heparin infusion in the emergency room.  We have admitted her to the hospitalist service for further management.  PAST MEDICAL HISTORY:   Past Medical History:  Diagnosis Date  . Diabetes mellitus without complication (HCC)   . GERD (gastroesophageal reflux disease)   . Joint pain   . Murmur   . Stroke [facility]) Sept 2015   TIA    PAST SURGICAL HISTORY:   Past Surgical History:  Procedure Laterality Date  . BREAST BIOPSY Bilateral 1988   benign/Dr Evette Cristal  . CESAREAN SECTION  1986    SOCIAL HISTORY:   Social History   Tobacco Use  . Smoking status: Former Games developer  . Smokeless tobacco: Never Used  Substance Use Topics  . Alcohol use: No    Alcohol/week: 0.0 standard drinks    FAMILY HISTORY:   Family History  Problem Relation Age of Onset   . COPD Mother   . COPD Father     DRUG ALLERGIES:   Allergies  Allergen Reactions  . Erythromycin     Stomach Problems'   . Levaquin [Levofloxacin In D5w]   . Penicillins     Heart races  . Tequin [Gatifloxacin]   . Zoloft [Sertraline Hcl]   . Zithromax [Azithromycin] Palpitations    Heart race    REVIEW OF SYSTEMS:   Review of Systems  Constitutional: Negative for chills and fever.  HENT: Negative for congestion, sinus pain and sore throat.   Eyes: Negative for blurred vision and double vision.  Respiratory: Positive for shortness of breath. Negative for cough and wheezing.   Cardiovascular: Positive for chest pain. Negative for palpitations and leg swelling.  Gastrointestinal: Positive for nausea. Negative for abdominal pain, blood in stool, constipation, diarrhea, heartburn and vomiting.  Genitourinary: Negative for dysuria, flank pain and hematuria.  Musculoskeletal: Negative for falls and myalgias.  Skin: Negative for itching and rash.  Neurological: Negative for dizziness and headaches.  Psychiatric/Behavioral: Negative for depression.     MEDICATIONS AT HOME:   Prior to Admission medications   Medication Sig Start Date End Date Taking? Authorizing Provider  aspirin 81 MG tablet Take 81 mg by mouth daily.   Yes [provider]  cholecalciferol (VITAMIN D) 1000 UNITS tablet Take 1,000 Units by mouth daily.  Yes [provider]  diphenhydrAMINE (BENADRYL) 25 MG tablet Take 25 mg by mouth at bedtime.   Yes [provider]  glipiZIDE (GLUCOTROL XL) 5 MG 24 hr tablet Take 5 mg by mouth daily. 07/23/19  Yes [provider]  ibuprofen (ADVIL) 400 MG tablet Take 800 mg by mouth every 6 (six) hours as needed. Take two tablets in the morning and two tablets in the evening   Yes [provider]  losartan (COZAAR) 100 MG tablet Take 100 mg by mouth daily. 05/20/19  Yes [provider]  metFORMIN (GLUCOPHAGE) 1000 MG tablet  Take 1,000 mg by mouth 2 (two) times daily with a meal.  11/02/14  Yes [provider]  Multiple Vitamin (MULTIVITAMIN WITH MINERALS) TABS tablet Take 1 tablet by mouth daily.   Yes [provider]  vitamin E 400 UNIT capsule Take 400 Units by mouth daily.   Yes [provider]  colesevelam (WELCHOL) 625 MG tablet Take 312.5 mg by mouth daily with breakfast.    [provider]  NASONEX 50 MCG/ACT nasal spray Place 2 sprays into the nose daily.  10/29/14   [provider]  nystatin (MYCOSTATIN) 100000 UNIT/ML suspension Take 5 mLs by mouth as needed.    [provider]      VITAL SIGNS:  Blood pressure 135/70, pulse 73, temperature 99 F (37.2 C), temperature source Oral, resp. rate 20, height 5\' 3"  (1.6 m), weight 69.4 kg, SpO2 96 %.  PHYSICAL EXAMINATION:  Physical Exam  GENERAL:  68 y.o.-year-old patient lying in the bed with no acute distress.  EYES: Pupils equal, round, reactive to light and accommodation. No scleral icterus. Extraocular muscles intact.  HEENT: Head atraumatic, normocephalic. Oropharynx and nasopharynx clear.  NECK:  Supple, no jugular venous distention. No thyroid enlargement, no tenderness.  LUNGS: Normal breath sounds bilaterally, no wheezing, rales,rhonchi or crepitation. No use of accessory muscles of respiration.  CARDIOVASCULAR: Regular rate and rhythm, S1, S2 normal. No murmurs, rubs, or gallops.  ABDOMEN: Soft, nondistended, nontender. Bowel sounds present. No organomegaly or mass.  EXTREMITIES: No pedal edema, cyanosis, or clubbing.  NEUROLOGIC: Cranial nerves II through XII are intact. Muscle strength 5/5 in all extremities. Sensation intact. Gait not checked.  PSYCHIATRIC: The patient is alert and oriented x 3.  Normal affect and good eye contact. SKIN: No obvious rash, lesion, or ulcer.   LABORATORY PANEL:   CBC Recent Labs  Lab 07/29/19 1712  WBC 6.4  HGB 12.2  HCT 35.8*  PLT 182    ------------------------------------------------------------------------------------------------------------------  Chemistries  Recent Labs  Lab 07/29/19 1712  NA 137  K 3.9  CL 106  CO2 21*  GLUCOSE 94  BUN 17  CREATININE 0.73  CALCIUM 10.4*   ------------------------------------------------------------------------------------------------------------------  Cardiac Enzymes No results for input(s): TROPONINI in the last 168 hours. ------------------------------------------------------------------------------------------------------------------  RADIOLOGY:  Dg Chest 2 View  Result Date: 07/29/2019 CLINICAL DATA:  Intermittent left chest pain radiating to the mandible for the past 3 days. Shortness of breath. Ex-smoker. EXAM: CHEST - 2 VIEW COMPARISON:  07/23/2014. FINDINGS: Normal sized heart. Stable linear scarring at the left lung base. Otherwise, clear lungs with normal vascularity. Minimal peribronchial thickening without significant change. Mild thoracic spine degenerative changes. IMPRESSION: No acute abnormality. Stable minimal chronic bronchitic changes. Electronically Signed   By: Beckie Salts M.D.   On: 07/29/2019 17:26      IMPRESSION AND PLAN:   1.  Chest pain - Continue to trend troponin levels -Cardiology,  Dr. Elberta Fortis consulted for further evaluation recommendations - Echocardiogram -EKG repeated in the a.m. -Lipid panel pending -Aspirin 81 mg -Heparin infusion initiated -Beta-blocker initiated - Statin therapy continued  2.  Elevated troponin-193 high-sensitivity - Orders as previously stated -Telemetry monitoring -Repeat troponin levels every 2 hours x3 -Repeat EKG in the a.m.  3.  Diabetes mellitus - Metformin continued -Moderate sliding scale insulin -Hemoglobin A1c  4.  Hypertension -Cozaar continued  DVT and PPI prophylaxis    All the records are reviewed and case discussed with ED provider. The plan of care was discussed in details  with the patient (and family). I answered all questions. The patient agreed to proceed with the above mentioned plan. Further management will depend upon hospital course.   CODE STATUS: Full code  TOTAL TIME TAKING CARE OF THIS PATIENT: 45 minutes.    Milas Kocher Vallory Oetken CRNP on 07/29/2019 at 11:30 PM  Pager - [phone]  After 6pm go to [email or web address] - Social research officer, government  [facility]  Office  [phone]  CC: Primary care physician; Corky Downs, MD   Note: This dictation was prepared with Dragon dictation along with smaller phrase technology. Any transcriptional errors that result from this process are unintentional.

## 2019-07-29 NOTE — ED Notes (Signed)
Unable to give report due to RN passing medications.

## 2019-07-29 NOTE — ED Notes (Signed)
ED TO INPATIENT HANDOFF REPORT  ED Nurse Name and Phone #: Joelene Millin [phone]  S Name/Age/Gender Donna Harper 68 y.o. female Room/Bed: ED01A/ED01A  Code Status   Code Status: Full Code  Home/SNF/Other Home Patient oriented to: self, place, time and situation Is this baseline? Yes   Triage Complete: Triage complete  Chief Complaint chest pain, sent by provider  Triage Note Pt c/o intermittent left side pain that radiates from left jaw to left chest.  Has been present for 3 days.  Also has had SHOB.  Denies nausea.  Has had some diaphoresis.  Pt ambulatory in triage. VSS.     Allergies Allergies  Allergen Reactions  . Erythromycin     Stomach Problems'   . Levaquin [Levofloxacin In D5w]   . Penicillins     Heart races  . Tequin [Gatifloxacin]   . Zoloft [Sertraline Hcl]   . Zithromax [Azithromycin] Palpitations    Heart race    Level of Care/Admitting Diagnosis ED Disposition    ED Disposition Condition [facility]: [place] [100120]  Level of Care: Med-Surg [16]  Covid Evaluation: Asymptomatic Screening Protocol (No Symptoms)  Diagnosis: Chest pain [976734]  Admitting Physician: Mayer Camel [[id number]]  Attending Physician: Mayer Camel [[id number]]  PT Class (Do Not Modify): Observation [104]  PT Acc Code (Do Not Modify): Observation [10022]       B Medical/Surgery History Past Medical History:  Diagnosis Date  . Diabetes mellitus without complication ([place])   . GERD (gastroesophageal reflux disease)   . Joint pain   . Murmur   . Stroke [facility]) Sept 2015   TIA   Past Surgical History:  Procedure Laterality Date  . BREAST BIOPSY Bilateral 1988   benign/Dr Jamal Collin  . [place]     A IV Location/Drains/Wounds Patient Lines/Drains/Airways Status   Active Line/Drains/Airways    Name:   Placement date:   Placement time:   Site:   Days:   Peripheral IV 07/29/19 Right Antecubital   07/29/19     1858    Antecubital   less than 1          Intake/Output Last 24 hours No intake or output data in the 24 hours ending 07/29/19 2051  Labs/Imaging Results for orders placed or performed during the hospital encounter of 07/29/19 (from the past 48 hour(s))  Basic metabolic panel     Status: Abnormal   Collection Time: 07/29/19  5:12 PM  Result Value Ref Range   Sodium 137 135 - 145 mmol/L   Potassium 3.9 3.5 - 5.1 mmol/L   Chloride 106 98 - 111 mmol/L   CO2 21 (L) 22 - 32 mmol/L   Glucose, Bld 94 70 - 99 mg/dL   BUN 17 8 - 23 mg/dL   Creatinine, Ser 0.73 0.44 - 1.00 mg/dL   Calcium 10.4 (H) 8.9 - 10.3 mg/dL   GFR calc non Af Amer >60 >60 mL/min   GFR calc Af Amer >60 >60 mL/min   Anion gap 10 5 - 15    Comment: Performed at [facility], [address] [place], [place] [address]  CBC     Status: Abnormal   Collection Time: 07/29/19  5:12 PM  Result Value Ref Range   WBC 6.4 4.0 - 10.5 K/uL   RBC 4.08 3.87 - 5.11 MIL/uL   Hemoglobin 12.2 12.0 - 15.0 g/dL   HCT 35.8 (L) 36.0 - 46.0 %  MCV 87.7 80.0 - 100.0 fL   MCH 29.9 26.0 - 34.0 pg   MCHC 34.1 30.0 - 36.0 g/dL   RDW 13.2 11.5 - 15.5 %   Platelets 182 150 - 400 K/uL   nRBC 0.0 0.0 - 0.2 %    Comment: Performed at [facility], [address] [place], [place] [address]  Troponin I (High Sensitivity)     Status: Abnormal   Collection Time: 07/29/19  5:12 PM  Result Value Ref Range   Troponin I (High Sensitivity) 160 (HH) <18 ng/L    Comment: CRITICAL RESULT CALLED TO, READ BACK BY AND VERIFIED WITH STEPHANIE RUDD AT 1804 07/29/2019  TFK (NOTE) Elevated high sensitivity troponin I (hsTnI) values and significant  changes across serial measurements may suggest ACS but many other  chronic and acute conditions are known to elevate hsTnI results.  Refer to the "Links" section for chest pain algorithms and additional  guidance. Performed at [facility], [place], [place]  [address]   Troponin I (High Sensitivity)     Status: Abnormal   Collection Time: 07/29/19  6:57 PM  Result Value Ref Range   Troponin I (High Sensitivity) 193 (HH) <18 ng/L    Comment: READ BACK AND VERIFIED WITH [facility] [place]iding AT 1932 07/29/2019  TFK (NOTE) Elevated high sensitivity troponin I (hsTnI) values and significant  changes across serial measurements may suggest ACS but many other  chronic and acute conditions are known to elevate hsTnI results.  Refer to the "Links" section for chest pain algorithms and additional  guidance. Performed at [facility], [address] [place], [place] [address]   SARS Coronavirus 2 [facility] order, Performed in [facility] hospital lab) Nasopharyngeal Nasopharyngeal Swab     Status: None   Collection Time: 07/29/19  6:57 PM   Specimen: Nasopharyngeal Swab  Result Value Ref Range   SARS Coronavirus 2 NEGATIVE NEGATIVE    Comment: (NOTE) If result is NEGATIVE SARS-CoV-2 target nucleic acids are NOT DETECTED. The SARS-CoV-2 RNA is generally detectable in upper and lower  respiratory specimens during the acute phase of infection. The lowest  concentration of SARS-CoV-2 viral copies this assay can detect is 250  copies / mL. A negative result does not preclude SARS-CoV-2 infection  and should not be used as the sole basis for treatment or other  patient management decisions.  A negative result may occur with  improper specimen collection / handling, submission of specimen other  than nasopharyngeal swab, presence of viral mutation(s) within the  areas targeted by this assay, and inadequate number of viral copies  (<250 copies / mL). A negative result must be combined with clinical  observations, patient history, and epidemiological information. If result is POSITIVE SARS-CoV-2 target nucleic acids are DETECTED. The SARS-CoV-2 RNA is generally detectable in upper and lower  respiratory specimens dur ing the acute phase of  infection.  Positive  results are indicative of active infection with SARS-CoV-2.  Clinical  correlation with patient history and other diagnostic information is  necessary to determine patient infection status.  Positive results do  not rule out bacterial infection or co-infection with other viruses. If result is PRESUMPTIVE POSTIVE SARS-CoV-2 nucleic acids MAY BE PRESENT.   A presumptive positive result was obtained on the submitted specimen  and confirmed on repeat testing.  While 2019 novel coronavirus  (SARS-CoV-2) nucleic acids may be present in the submitted sample  additional confirmatory testing may be necessary for epidemiological  and / or clinical management purposes  to differentiate between  SARS-CoV-2 and other Sarbecovirus currently known to infect humans.  If clinically indicated additional testing with an alternate test  methodology [phone]) is advised. The SARS-CoV-2 RNA is generally  detectable in upper and lower respiratory sp ecimens during the acute  phase of infection. The expected result is Negative. Fact Sheet for Patients:  StrictlyIdeas.no Fact Sheet for Healthcare Providers: BankingDealers.co.za This test is not yet approved or cleared by the [place] FDA and has been authorized for detection and/or diagnosis of SARS-CoV-2 by FDA under an Emergency Use Authorization (EUA).  This EUA will remain in effect (meaning this test can be used) for the duration of the COVID-19 declaration under Section 564(b)(1) of the Act, 21 U.S.C. section 360bbb-3(b)(1), unless the authorization is terminated or revoked sooner. Performed at [facility], [address] [place], [place] [address]   Protime-INR     Status: None   Collection Time: 07/29/19  8:00 PM  Result Value Ref Range   Prothrombin Time 13.4 11.4 - 15.2 seconds   INR 1.0 0.8 - 1.2    Comment: (NOTE) INR goal varies based on device and disease  states. Performed at [facility], [address] [place], [place] [address]   APTT     Status: None   Collection Time: 07/29/19  8:00 PM  Result Value Ref Range   aPTT 26 24 - 36 seconds    Comment: Performed at [facility], [address] [place],  [address]  Glucose, capillary     Status: None   Collection Time: 07/29/19  8:15 PM  Result Value Ref Range   Glucose-Capillary 99 70 - 99 mg/dL   Dg Chest 2 View  Result Date: 07/29/2019 CLINICAL DATA:  Intermittent left chest pain radiating to the mandible for the past 3 days. Shortness of breath. Ex-smoker. EXAM: CHEST - 2 VIEW COMPARISON:  07/23/2014. FINDINGS: Normal sized heart. Stable linear scarring at the left lung base. Otherwise, clear lungs with normal vascularity. Minimal peribronchial thickening without significant change. Mild thoracic spine degenerative changes. IMPRESSION: No acute abnormality. Stable minimal chronic bronchitic changes. Electronically Signed   By: Claudie Revering M.D.   On: 07/29/2019 17:26    Pending Labs Unresulted Labs (From admission, onward)    Start     Ordered   07/30/19 0200  Heparin level (unfractionated)  Once-Timed,   STAT     07/29/19 2005   07/29/19 2042  HIV antibody (Routine Testing)  Once,   STAT     07/29/19 2041   07/29/19 2042  Lipid panel  Once,   STAT     07/29/19 2041          Vitals/Pain Today's Vitals   07/29/19 1704 07/29/19 1705 07/29/19 1706 07/29/19 1900  BP:   (!) 163/81 (!) 157/76  Pulse:   90 93  Resp:   18 (!) 22  Temp:   98.1 F (36.7 C)   TempSrc:   Oral   SpO2:   96% 95%  Weight:  68 kg    Height:  5\' 3"  (1.6 m)    PainSc: 5        Isolation Precautions No active isolations  Medications Medications  heparin ADULT infusion 100 units/mL (25000 units/247mL sodium chloride 0.45%) (800 Units/hr Intravenous New Bag/Given 07/29/19 2004)  aspirin tablet 81 mg (has no administration in time range)  colesevelam [facility]) tablet  312.5 mg (has no administration in time  range)  glipiZIDE (GLUCOTROL XL) 24 hr tablet 5 mg (has no administration in time range)  losartan (COZAAR) tablet 100 mg (has no administration in time range)  metFORMIN (GLUCOPHAGE) tablet 1,000 mg (has no administration in time range)  fluticasone (FLONASE) 50 MCG/ACT nasal spray 1 spray (has no administration in time range)  acetaminophen (TYLENOL) tablet 650 mg (has no administration in time range)  ondansetron (ZOFRAN) injection 4 mg (has no administration in time range)  metoprolol tartrate (LOPRESSOR) tablet 25 mg (has no administration in time range)  aspirin chewable tablet 324 mg (324 mg Oral Given 07/29/19 1917)  nitroGLYCERIN (NITROGLYN) 2 % ointment 0.5 inch (0.5 inches Topical Given 07/29/19 1920)  heparin bolus via infusion 4,000 Units (4,000 Units Intravenous Bolus from Bag 07/29/19 2005)    Mobility walks Low fall risk   Focused Assessments Cardiac Assessment Handoff:  Cardiac Rhythm: Normal sinus rhythm Lab Results  Component Value Date   CKTOTAL 73 07/22/2014   CKMB < 0.5 (L) 07/23/2014   TROPONINI < 0.02 07/23/2014   No results found for: DDIMER Does the Patient currently have chest pain? No     R Recommendations: See Admitting Provider Note  Report given to:   Additional Notes: Heparin running 800 units/hour

## 2019-07-29 NOTE — ED Notes (Signed)
ED Provider at bedside. 

## 2019-07-30 ENCOUNTER — Encounter: Payer: Self-pay | Admitting: *Deleted

## 2019-07-30 ENCOUNTER — Encounter
Admission: EM | Disposition: A | Discharge status: Home / Self Care | Payer: Self-pay | Source: Home / Self Care | Attending: Internal Medicine

## 2019-07-30 ENCOUNTER — Other Ambulatory Visit: Payer: Self-pay

## 2019-07-30 ENCOUNTER — Inpatient Hospital Stay: Admit: 2019-07-30 | Payer: Medicare Other

## 2019-07-30 DIAGNOSIS — R0789 Other chest pain: Secondary | ICD-10-CM | POA: Diagnosis not present

## 2019-07-30 DIAGNOSIS — Z888 Allergy status to other drugs, medicaments and biological substances status: Secondary | ICD-10-CM | POA: Diagnosis not present

## 2019-07-30 DIAGNOSIS — Z7984 Long term (current) use of oral hypoglycemic drugs: Secondary | ICD-10-CM | POA: Diagnosis not present

## 2019-07-30 DIAGNOSIS — I214 Non-ST elevation (NSTEMI) myocardial infarction: Principal | ICD-10-CM | POA: Diagnosis present

## 2019-07-30 DIAGNOSIS — Z8673 Personal history of transient ischemic attack (TIA), and cerebral infarction without residual deficits: Secondary | ICD-10-CM | POA: Diagnosis not present

## 2019-07-30 DIAGNOSIS — Z8249 Family history of ischemic heart disease and other diseases of the circulatory system: Secondary | ICD-10-CM | POA: Diagnosis not present

## 2019-07-30 DIAGNOSIS — R079 Chest pain, unspecified: Secondary | ICD-10-CM | POA: Diagnosis present

## 2019-07-30 DIAGNOSIS — E785 Hyperlipidemia, unspecified: Secondary | ICD-10-CM | POA: Diagnosis present

## 2019-07-30 DIAGNOSIS — Z880 Allergy status to penicillin: Secondary | ICD-10-CM | POA: Diagnosis not present

## 2019-07-30 DIAGNOSIS — Z20828 Contact with and (suspected) exposure to other viral communicable diseases: Secondary | ICD-10-CM | POA: Diagnosis present

## 2019-07-30 DIAGNOSIS — Z825 Family history of asthma and other chronic lower respiratory diseases: Secondary | ICD-10-CM | POA: Diagnosis not present

## 2019-07-30 DIAGNOSIS — K219 Gastro-esophageal reflux disease without esophagitis: Secondary | ICD-10-CM | POA: Diagnosis present

## 2019-07-30 DIAGNOSIS — I10 Essential (primary) hypertension: Secondary | ICD-10-CM | POA: Diagnosis present

## 2019-07-30 DIAGNOSIS — Z7982 Long term (current) use of aspirin: Secondary | ICD-10-CM | POA: Diagnosis not present

## 2019-07-30 DIAGNOSIS — E119 Type 2 diabetes mellitus without complications: Secondary | ICD-10-CM | POA: Diagnosis present

## 2019-07-30 DIAGNOSIS — Z23 Encounter for immunization: Secondary | ICD-10-CM | POA: Diagnosis present

## 2019-07-30 DIAGNOSIS — I255 Ischemic cardiomyopathy: Secondary | ICD-10-CM | POA: Diagnosis present

## 2019-07-30 DIAGNOSIS — Z881 Allergy status to other antibiotic agents status: Secondary | ICD-10-CM | POA: Diagnosis not present

## 2019-07-30 DIAGNOSIS — I2510 Atherosclerotic heart disease of native coronary artery without angina pectoris: Secondary | ICD-10-CM

## 2019-07-30 DIAGNOSIS — Z87891 Personal history of nicotine dependence: Secondary | ICD-10-CM | POA: Diagnosis not present

## 2019-07-30 DIAGNOSIS — I25110 Atherosclerotic heart disease of native coronary artery with unstable angina pectoris: Secondary | ICD-10-CM | POA: Diagnosis present

## 2019-07-30 HISTORY — PX: CORONARY STENT INTERVENTION: CATH118234

## 2019-07-30 HISTORY — PX: LEFT HEART CATH AND CORONARY ANGIOGRAPHY: CATH118249

## 2019-07-30 LAB — GLUCOSE, CAPILLARY
Glucose-Capillary: 168 mg/dL — ABNORMAL HIGH (ref 70–99)
Glucose-Capillary: 149 mg/dL — ABNORMAL HIGH (ref 70–99)
Glucose-Capillary: 140 mg/dL — ABNORMAL HIGH (ref 70–99)
Glucose-Capillary: 221 mg/dL — ABNORMAL HIGH (ref 70–99)

## 2019-07-30 LAB — CBC
HCT: 33.2 % — ABNORMAL LOW (ref 36.0–46.0)
Hemoglobin: 11.4 g/dL — ABNORMAL LOW (ref 12.0–15.0)
MCH: 29.8 pg (ref 26.0–34.0)
MCHC: 34.3 g/dL (ref 30.0–36.0)
MCV: 86.7 fL (ref 80.0–100.0)
Platelets: 175 10*3/uL (ref 150–400)
RBC: 3.83 MIL/uL — ABNORMAL LOW (ref 3.87–5.11)
RDW: 13.1 % (ref 11.5–15.5)
WBC: 6.6 10*3/uL (ref 4.0–10.5)
nRBC: 0 % (ref 0.0–0.2)

## 2019-07-30 LAB — POCT ACTIVATED CLOTTING TIME
Activated Clotting Time: 224 seconds
Activated Clotting Time: 257 seconds

## 2019-07-30 LAB — HEMOGLOBIN A1C
Hgb A1c MFr Bld: 6.5 % — ABNORMAL HIGH (ref 4.8–5.6)
Mean Plasma Glucose: 139.85 mg/dL

## 2019-07-30 LAB — TROPONIN I (HIGH SENSITIVITY): Troponin I (High Sensitivity): 593 ng/L (ref <18)

## 2019-07-30 LAB — HEPARIN LEVEL (UNFRACTIONATED): Heparin Unfractionated: 0.23 IU/mL — ABNORMAL LOW (ref 0.30–0.70)

## 2019-07-30 SURGERY — LEFT HEART CATH AND CORONARY ANGIOGRAPHY
Anesthesia: Moderate Sedation

## 2019-07-30 MED ORDER — SODIUM CHLORIDE 0.9 % WEIGHT BASED INFUSION
3.0000 mL/kg/h | INTRAVENOUS | Status: DC
  Administered 2019-07-30: 3 mL/kg/h via INTRAVENOUS

## 2019-07-30 MED ORDER — HEPARIN SODIUM (PORCINE) 1000 UNIT/ML IJ SOLN
INTRAMUSCULAR | Status: AC
  Filled 2019-07-30: qty 1

## 2019-07-30 MED ORDER — HEPARIN (PORCINE) IN NACL 1000-0.9 UT/500ML-% IV SOLN
INTRAVENOUS | Status: AC
  Filled 2019-07-30: qty 1000

## 2019-07-30 MED ORDER — SODIUM CHLORIDE 0.9% FLUSH: 3.0000 mL | INTRAVENOUS | Status: DC | PRN

## 2019-07-30 MED ORDER — CANGRELOR BOLUS VIA INFUSION
INTRAVENOUS | Status: DC | PRN
  Administered 2019-07-30: 12:00:00 2058 ug via INTRAVENOUS

## 2019-07-30 MED ORDER — TICAGRELOR 90 MG PO TABS
ORAL_TABLET | ORAL | Status: AC
  Filled 2019-07-30: qty 2

## 2019-07-30 MED ORDER — PNEUMOCOCCAL VAC POLYVALENT 25 MCG/0.5ML IJ INJ
0.5000 mL | INJECTION | INTRAMUSCULAR | Status: AC
  Administered 2019-07-31: 12:00:00 0.5 mL via INTRAMUSCULAR
  Filled 2019-07-30: qty 0.5

## 2019-07-30 MED ORDER — TRAMADOL HCL 50 MG PO TABS: 50.0000 mg | ORAL_TABLET | Freq: Four times a day (QID) | ORAL | Status: DC | PRN

## 2019-07-30 MED ORDER — CANGRELOR TETRASODIUM 50 MG IV SOLR
INTRAVENOUS | Status: AC
  Filled 2019-07-30: qty 50

## 2019-07-30 MED ORDER — HYDRALAZINE HCL 20 MG/ML IJ SOLN: 10.0000 mg | INTRAMUSCULAR | Status: AC | PRN

## 2019-07-30 MED ORDER — NITROGLYCERIN 1 MG/10 ML FOR IR/CATH LAB
INTRA_ARTERIAL | Status: DC | PRN
  Administered 2019-07-30 (×2): 100 ug via INTRACORONARY

## 2019-07-30 MED ORDER — NITROGLYCERIN 0.4 MG SL SUBL: 0.4000 mg | SUBLINGUAL_TABLET | SUBLINGUAL | Status: DC | PRN

## 2019-07-30 MED ORDER — ENOXAPARIN SODIUM 40 MG/0.4ML ~~LOC~~ SOLN
40.0000 mg | SUBCUTANEOUS | Status: DC
  Administered 2019-07-31: 40 mg via SUBCUTANEOUS
  Filled 2019-07-30: qty 0.4

## 2019-07-30 MED ORDER — SODIUM CHLORIDE 0.9 % IV SOLN
INTRAVENOUS | Status: AC
  Administered 2019-07-30: 18:00:00 via INTRAVENOUS

## 2019-07-30 MED ORDER — TICAGRELOR 90 MG PO TABS
90.0000 mg | ORAL_TABLET | Freq: Two times a day (BID) | ORAL | Status: DC
  Administered 2019-07-30 – 2019-07-31 (×2): 90 mg via ORAL
  Filled 2019-07-30 (×2): qty 1

## 2019-07-30 MED ORDER — HEPARIN BOLUS VIA INFUSION
1000.0000 [IU] | Freq: Once | INTRAVENOUS | Status: AC
  Administered 2019-07-30: 1000 [IU] via INTRAVENOUS
  Filled 2019-07-30: qty 1000

## 2019-07-30 MED ORDER — LABETALOL HCL 5 MG/ML IV SOLN: 10.0000 mg | INTRAVENOUS | Status: AC | PRN

## 2019-07-30 MED ORDER — HEPARIN SODIUM (PORCINE) 1000 UNIT/ML IJ SOLN
INTRAMUSCULAR | Status: DC | PRN
  Administered 2019-07-30: 2000 [IU] via INTRAVENOUS
  Administered 2019-07-30 (×2): 3500 [IU] via INTRAVENOUS
  Administered 2019-07-30: 2000 [IU] via INTRAVENOUS

## 2019-07-30 MED ORDER — SODIUM CHLORIDE 0.9 % IV SOLN
INTRAVENOUS | Status: AC | PRN
  Administered 2019-07-30: 12:00:00 4 ug/kg/min via INTRAVENOUS

## 2019-07-30 MED ORDER — MIDAZOLAM HCL 2 MG/2ML IJ SOLN
INTRAMUSCULAR | Status: AC
  Filled 2019-07-30: qty 2

## 2019-07-30 MED ORDER — DIPHENHYDRAMINE HCL 25 MG PO CAPS
25.0000 mg | ORAL_CAPSULE | Freq: Every evening | ORAL | Status: DC | PRN
  Administered 2019-07-30: 22:00:00 25 mg via ORAL
  Filled 2019-07-30: qty 1

## 2019-07-30 MED ORDER — SODIUM CHLORIDE 0.9 % WEIGHT BASED INFUSION: 1.0000 mL/kg/h | INTRAVENOUS | Status: DC

## 2019-07-30 MED ORDER — FENTANYL CITRATE (PF) 100 MCG/2ML IJ SOLN
INTRAMUSCULAR | Status: DC | PRN
  Administered 2019-07-30: 25 ug via INTRAVENOUS
  Administered 2019-07-30: 50 ug via INTRAVENOUS
  Administered 2019-07-30 (×2): 25 ug via INTRAVENOUS

## 2019-07-30 MED ORDER — IOHEXOL 300 MG/ML  SOLN
INTRAMUSCULAR | Status: DC | PRN
  Administered 2019-07-30: 13:00:00 145 mL via INTRA_ARTERIAL

## 2019-07-30 MED ORDER — VERAPAMIL HCL 2.5 MG/ML IV SOLN
INTRAVENOUS | Status: AC
  Filled 2019-07-30: qty 2

## 2019-07-30 MED ORDER — SODIUM CHLORIDE 0.9 % IV SOLN: 250.0000 mL | INTRAVENOUS | Status: DC | PRN

## 2019-07-30 MED ORDER — SODIUM CHLORIDE 0.9% FLUSH: 3.0000 mL | Freq: Two times a day (BID) | INTRAVENOUS | Status: DC

## 2019-07-30 MED ORDER — DIPHENHYDRAMINE HCL 25 MG PO CAPS
25.0000 mg | ORAL_CAPSULE | ORAL | Status: AC
  Administered 2019-07-30: 01:00:00 25 mg via ORAL
  Filled 2019-07-30: qty 1

## 2019-07-30 MED ORDER — MORPHINE SULFATE (PF) 2 MG/ML IV SOLN
1.0000 mg | INTRAVENOUS | Status: AC
  Filled 2019-07-30: qty 1

## 2019-07-30 MED ORDER — FENTANYL CITRATE (PF) 100 MCG/2ML IJ SOLN
INTRAMUSCULAR | Status: AC
  Filled 2019-07-30: qty 2

## 2019-07-30 MED ORDER — ASPIRIN 81 MG PO CHEW: 81.0000 mg | CHEWABLE_TABLET | ORAL | Status: DC

## 2019-07-30 MED ORDER — MIDAZOLAM HCL 2 MG/2ML IJ SOLN
INTRAMUSCULAR | Status: DC | PRN
  Administered 2019-07-30 (×3): 1 mg via INTRAVENOUS

## 2019-07-30 MED ORDER — SODIUM CHLORIDE 0.9 % IV SOLN
4.0000 ug/kg/min | INTRAVENOUS | Status: AC
  Filled 2019-07-30: qty 50

## 2019-07-30 MED ORDER — VERAPAMIL HCL 2.5 MG/ML IV SOLN
INTRAVENOUS | Status: DC | PRN
  Administered 2019-07-30 (×2): 2.5 mg via INTRA_ARTERIAL

## 2019-07-30 MED ORDER — TICAGRELOR 90 MG PO TABS
ORAL_TABLET | ORAL | Status: DC | PRN
  Administered 2019-07-30: 180 mg via ORAL

## 2019-07-30 MED ORDER — HEPARIN (PORCINE) IN NACL 1000-0.9 UT/500ML-% IV SOLN
INTRAVENOUS | Status: DC | PRN
  Administered 2019-07-30 (×2): 500 mL

## 2019-07-30 SURGICAL SUPPLY — 17 items
BALLN TREK RX 2.25X8 (BALLOONS) ×3
BALLN ~~LOC~~ TREK RX 3.5X8 (BALLOONS) ×3
BALLOON TREK RX 2.25X8 (BALLOONS) ×1 IMPLANT
BALLOON ~~LOC~~ TREK RX 3.5X8 (BALLOONS) ×1 IMPLANT
CATH 5F 110X4 TIG (CATHETERS) ×3 IMPLANT
CATH GUIDE ADROIT 6FR AL.75 (CATHETERS) ×6 IMPLANT
CATH INFINITI 5FR ANG PIGTAIL (CATHETERS) ×3 IMPLANT
DEVICE INFLAT 30 PLUS (MISCELLANEOUS) ×3 IMPLANT
DEVICE RAD COMP TR BAND LRG (VASCULAR PRODUCTS) ×3 IMPLANT
DEVICE RAD TR BAND REGULAR (VASCULAR PRODUCTS) ×3 IMPLANT
GLIDESHEATH SLEND SS 6F .021 (SHEATH) ×3 IMPLANT
KIT MANI 3VAL PERCEP (MISCELLANEOUS) ×3 IMPLANT
PACK CARDIAC CATH (CUSTOM PROCEDURE TRAY) ×3 IMPLANT
STENT RESOLUTE ONYX 3.0X12 (Permanent Stent) ×3 IMPLANT
WIRE HITORQ VERSACORE ST 145CM (WIRE) ×3 IMPLANT
WIRE ROSEN-J .035X260CM (WIRE) ×3 IMPLANT
WIRE RUNTHROUGH .014X180CM (WIRE) ×3 IMPLANT

## 2019-07-30 NOTE — Progress Notes (Signed)
2 cc air removed from Pt right wrist at 1615. Pt assisted to bedside commode for bowel movement. Once back in bed site assessed. Small ooze inside TR band. 2 cc air placed in TR band. Small bruised resolved hematoma from Cath Lab appears darker and is more tender. Pressure held for about 5 minutes and site softened. Dr. Saunders Revel paged to bedside. MD assessed site and held slight pressure to hematoma. TR band deflated by MD, only 5 cc air found in TR band. TR band repositioned by MD covering part of bruising in upper wrist. 7 cc air placed per MD. No bleeding at this time. Will start deflating again at 1720 per Dr. Saunders Revel.

## 2019-07-30 NOTE — Interval H&P Note (Signed)
History and Physical Interval Note:  07/30/2019 11:37 AM  Donna Harper  has presented today for cardiac catheterization, with the diagnosis of NSTEMI.  The various methods of treatment have been discussed with the patient and family. After consideration of risks, benefits and other options for treatment, the patient has consented to  Procedure(s): LEFT HEART CATH AND CORONARY ANGIOGRAPHY (N/A) as a surgical intervention.  The patient's history has been reviewed, patient examined, no change in status, stable for surgery.  I have reviewed the patient's chart and labs.  Questions were answered to the patient's satisfaction.    Cath Lab Visit (complete for each Cath Lab visit)  Clinical Evaluation Leading to the Procedure:   ACS: Yes.    Non-ACS:  N/A  Jon[place]an Heberle

## 2019-07-30 NOTE — Progress Notes (Addendum)
Pt with bleeding found at 1450. 65ml air replaced per Caryl Pina RN from cath Lab, bleeding stopped. Pt states breathlessness has stopped since cangrelor  gtt turned off. VSS. Dr. Saunders Revel at beside updated on Patient status. Will monitor closely.

## 2019-07-30 NOTE — Brief Op Note (Signed)
BRIEF CARDIAC CATHETERIZATION NOTE  07/30/2019  1:03 PM  PATIENT:  Donna Harper  68 y.o. female  PRE-OPERATIVE DIAGNOSIS:  NSTEMI  POST-OPERATIVE DIAGNOSIS:  NSTEMI  PROCEDURE:  Procedure(s) with comments: LEFT HEART CATH AND CORONARY ANGIOGRAPHY (N/A) CORONARY STENT INTERVENTION (N/A) - RCA  SURGEON:  Surgeon(s) and Role:    * Relena Ivancic, Harrell Gave, MD - Primary  FINDINGS: 1. Significant 2-vessel CAD with multifocal mid LAD disease of up to 60-70% and 95% mid RCA stenosis. 2. Basal inferior hypokinesis with otherwise preserved LVEF. 3. Normal LVEDP. 4. Successful PCI to mid RCA using Resolute Onyx 3.0 x 12 mm DES with 0% residual stenosis and TIMI-3 flow.  RECOMMENDATIONS: 1. Complete 2 hours of cangrelor infusion. 2. DAPT with ASA and ticagrelor for at least 12 months. 3. Aggressive secondary prevention.  Nelva Bush, MD [facility] Pager: [phone]

## 2019-07-30 NOTE — Progress Notes (Signed)
Pt denies pain but complains of breathlessness. Pt states she has had this feeling before usually accompanied with chest pain. Dr. Saunders Revel notified via text page. Per MD to give NTG SL. Pt refusing this for now, states NTG causes severe head ache. VSS at this time, not in respiratory distress.  Md updated. Will monitor closely.

## 2019-07-30 NOTE — Progress Notes (Signed)
ANTICOAGULATION CONSULT NOTE - follow up  Pharmacy Consult for Heparin  Indication: chest pain/ACS  Allergies  Allergen Reactions  . Erythromycin     Stomach Problems'   . Levaquin [Levofloxacin In D5w]   . Penicillins     Heart races  . Tequin [Gatifloxacin]   . Zoloft [Sertraline Hcl]   . Zithromax [Azithromycin] Palpitations    Heart race   Patient Measurements: Height: 5\' 3"  (160 cm) Weight: 151 lb 3.2 oz (68.6 kg) IBW/kg (Calculated) : 52.4 Heparin Dosing Weight:  66.3 kg   Vital Signs: Temp: 99 F (37.2 C) (08/17 2218) Temp Source: Oral (08/17 2218) BP: 135/70 (08/17 2218) Pulse Rate: 73 (08/17 2218)  Labs: Recent Labs    07/29/19 1712 07/29/19 1857 07/29/19 2000 07/29/19 2248 07/30/19 0033 07/30/19 0222  HGB 12.2  --   --   --   --   --   HCT 35.8*  --   --   --   --   --   PLT 182  --   --   --   --   --   APTT  --   --  26  --   --   --   LABPROT  --   --  13.4  --   --   --   INR  --   --  1.0  --   --   --   HEPARINUNFRC  --   --   --   --   --  0.23*  CREATININE 0.73  --   --   --   --   --   TROPONINIHS 160* 193*  --  375* 593*  --    Estimated Creatinine Clearance: 62.6 mL/min (by C-G formula based on SCr of 0.73 mg/dL).  Medical History: Past Medical History:  Diagnosis Date  . Diabetes mellitus without complication ([place])   . GERD (gastroesophageal reflux disease)   . Joint pain   . Murmur   . Stroke [facility]) Sept 2015   TIA   Medications:  Medications Prior to Admission  Medication Sig Dispense Refill Last Dose  . aspirin 81 MG tablet Take 81 mg by mouth daily.   07/29/2019 at 1300  . cholecalciferol (VITAMIN D) 1000 UNITS tablet Take 1,000 Units by mouth daily.   07/29/2019 at 1300  . diphenhydrAMINE (BENADRYL) 25 MG tablet Take 25 mg by mouth at bedtime.   07/28/2019 at 2100  . glipiZIDE (GLUCOTROL XL) 5 MG 24 hr tablet Take 5 mg by mouth daily.   07/29/2019 at 0830  . ibuprofen (ADVIL) 400 MG tablet Take 800 mg by mouth every 6 (six)  hours as needed. Take two tablets in the morning and two tablets in the evening   07/29/2019 at 0830  . losartan (COZAAR) 100 MG tablet Take 100 mg by mouth daily.   07/29/2019 at 0830  . metFORMIN (GLUCOPHAGE) 1000 MG tablet Take 1,000 mg by mouth 2 (two) times daily with a meal.   0 07/29/2019 at 0830  . Multiple Vitamin (MULTIVITAMIN WITH MINERALS) TABS tablet Take 1 tablet by mouth daily.   07/29/2019 at 1300  . vitamin E 400 UNIT capsule Take 400 Units by mouth daily.   prn at prn  . colesevelam (WELCHOL) 625 MG tablet Take 312.5 mg by mouth daily with breakfast.   Not Taking at Unknown time  . NASONEX 50 MCG/ACT nasal spray Place 2 sprays into the nose daily.   0 prn at  prn  . nystatin (MYCOSTATIN) 100000 UNIT/ML suspension Take 5 mLs by mouth as needed.   prn at prn   Assessment: Pharmacy consulted to dose heparin in this 68 year old female admitted with ACS/NSTEMI.  No prior anticoag noted.  CrCl = 62.3 ml/min  0818 @ 0222 HL = 0.23, subtherapeutic  Goal of Therapy:  Heparin level 0.3-0.7 units/ml Monitor platelets by anticoagulation protocol: Yes   Plan:  Rebolus with Heparin 1000 units x 1 Increase Heparin infusion to 950 units/hr Recheck HL 6 hours after rate change Monitor CBC, HL, s/sx of bleeding complications  Hart Robinsons A 07/30/2019,3:12 AM

## 2019-07-30 NOTE — H&P (View-Only) (Signed)
Cardiology Consultation:   Patient ID: Donna Harper MRN: [id number]; DOB: [birth year]  Admit date: 07/29/2019 Date of Consult: 07/30/2019  Primary Care Provider: Corky Downs, MD Primary Cardiologist:[facility], Dr. Okey Harper rounding Primary Electrophysiologist:  None    Patient Profile:   Donna Harper is a 68 y.o. female with a hx of HLD, HTN, DM2 (A1C 6.5), GERD, TIA, remote history of tobacco abuse, and who is being seen today for the evaluation of chest pain at the request of Donna Merl, NP.  History of Present Illness:   Donna Harper is a 68 year old female with PMH as above and no current history of tobacco, alcohol, or drug abuse.  Per review of EMR, she does have a remote history of smoking.  She is followed by Donna Harper, PCP.  Family history includes a father with CABG and valve replacement and a sister, deceased at approximately age 16 of aortic dissection.  She reportedly lives at home with her husband, who suffers from muscular dystrophy; therefore, she does the majority of the household work.  She denies any past history of cardiac disease other than HTN, HLD, and the rare palpitation while watching television or sitting quietly.  Recently, she has noticed that she becomes short of breath when ambulating short distances.  She recently moved into a smaller and more handicapped friendly home for her husband, and reported that the distance from the great room to kitchen is quite short; however, she notices that she becomes short of breath when even going to the kitchen.    Last Wednesday, 07/24/2019, she noticed that she was more fatigued than usual.  She thought that it was likely due to the heat and humidity, however.  On Thursdays, she normally mows the lawn.  This past Thursday, 07/25/2019 started with the riding mower and transition to the push mower at which time she felt chest pain and shortness of breath.  The chest pain was mainly located in her anterior/substernal chest at that  point, and it was described as a chest pressure and burning sensation.  She reported that she had never experienced chest pain like this before, including the previous week when she had mowed the lawn.  She also stated that she felt as if she was going to pass out; therefore, she came in to have a glass of water.  She reported activity/ambulation made the chest pain worse and rest made the chest pain better.  From that point forward, she noted chest pain that waxed and waned over the next few days.  Each episode of chest pain lasted approximately 15 minutes and was rated 6/10 in severity.  She denied any associated nausea or emesis.  Associated symptoms included severe fatigue and shortness of breath (worse with ambulation).  On Friday, 07/26/2019, her chest pain moved into her left jaw and down her left arm, stopping at the level of the elbow.  She decided not to come into the emergency department, however, as it was the weekend.  Then, on Monday, 07/29/2019, she reported to [facility] emergency department.  In the emergency department, vitals were significant for BP 168/81, HR 90 bpm, T98.56F, SpO2 96%.  Initial COVID-19.  Labs significant for sodium 137, potassium 3.9, creatinine 0.73, calcium 10.4, WBC 6.4, Hgb 12.2, RBC 4.08, HS Tn 160  193  375  593, HDL 47, LDL 143, Tg 372, total cholesterol 264, Hgb A1C 6.5. CXR showed no acute abnormality with stable minimal bronchitic changes. EKG showed normal sinus rhythm, 82 bpm, poor  conduction through lead III and aVL, nonspecific ST abnormality noted in the precordial leads (V3-V6).  During evaluation today, she denies current CP, SOB, palpitations, feeling of racing HR. She continues to note fatigue, which she stated may be due to a poor night's sleep. She is scheduled for LHC later today 8/18. The procedure with Risks/Benefits/Alternatives and Indications was reviewed with the patient.  All questions were answered.  Risks / Complications include, but not limited to:  Death, MI, CVA/TIA, VF/VT (with defibrillation), Bradycardia (need for temporary pacer placement), contrast induced nephropathy, bleeding / bruising / hematoma / pseudoaneurysm, radiation-related injury in the case of prolonged fluoroscopy use, vascular or coronary injury (with possible emergent CT or Vascular Surgery), adverse medication reactions, infection.  The patient understands the risks of serious complication is 1-2 in 1000 with diagnostic cardiac cath and 1-2% or less with angioplasty/stenting. The patient voices understanding and agree to proceed.     Heart Pathway Score:     Past Medical History:  Diagnosis Date   Diabetes mellitus without complication (HCC)    GERD (gastroesophageal reflux disease)    Joint pain    Murmur    Stroke [facility]) Sept 2015   TIA    Past Surgical History:  Procedure Laterality Date   BREAST BIOPSY Bilateral 1988   benign/Dr Sankar   CESAREAN SECTION  1986     Home Medications:  Prior to Admission medications   Medication Sig Start Date End Date Taking? Authorizing Provider  aspirin 81 MG tablet Take 81 mg by mouth daily.   Yes [provider]  cholecalciferol (VITAMIN D) 1000 UNITS tablet Take 1,000 Units by mouth daily.   Yes [provider]  diphenhydrAMINE (BENADRYL) 25 MG tablet Take 25 mg by mouth at bedtime.   Yes [provider]  glipiZIDE (GLUCOTROL XL) 5 MG 24 hr tablet Take 5 mg by mouth daily. 07/23/19  Yes [provider]  ibuprofen (ADVIL) 400 MG tablet Take 800 mg by mouth every 6 (six) hours as needed. Take two tablets in the morning and two tablets in the evening   Yes [provider]  losartan (COZAAR) 100 MG tablet Take 100 mg by mouth daily. 05/20/19  Yes [provider]  metFORMIN (GLUCOPHAGE) 1000 MG tablet Take 1,000 mg by mouth 2 (two) times daily with a meal.  11/02/14  Yes [provider]  Multiple Vitamin (MULTIVITAMIN WITH MINERALS) TABS tablet Take 1  tablet by mouth daily.   Yes [provider]  vitamin E 400 UNIT capsule Take 400 Units by mouth daily.   Yes [provider]  colesevelam (WELCHOL) 625 MG tablet Take 312.5 mg by mouth daily with breakfast.    [provider]  NASONEX 50 MCG/ACT nasal spray Place 2 sprays into the nose daily.  10/29/14   [provider]  nystatin (MYCOSTATIN) 100000 UNIT/ML suspension Take 5 mLs by mouth as needed.    [provider]    Inpatient Medications: Scheduled Meds:  aspirin EC  81 mg Oral Daily   colesevelam  312.5 mg Oral Q breakfast   fluticasone  1 spray Each Nare Daily   glipiZIDE  5 mg Oral Daily   insulin aspart  0-15 Units Subcutaneous TID WC   insulin aspart  0-5 Units Subcutaneous QHS   losartan  100 mg Oral Daily   metFORMIN  1,000 mg Oral BID WC   metoprolol tartrate  25 mg Oral BID    morphine injection  1 mg  Intravenous NOW   [START ON 07/31/2019] pneumococcal 23 valent vaccine  0.5 mL Intramuscular Tomorrow-1000   Continuous Infusions:  heparin 950 Units/hr (07/30/19 0323)   PRN Meds: acetaminophen, ondansetron (ZOFRAN) IV, traMADol  Allergies:    Allergies  Allergen Reactions   Erythromycin     Stomach Problems'    Levaquin [Levofloxacin In D5w]    Penicillins     Heart races   Tequin [Gatifloxacin]    Zoloft [Sertraline Hcl]    Zithromax [Azithromycin] Palpitations    Heart race    Social History:   Social History   Socioeconomic History   Marital status: Married    Spouse name: Not on file   Number of children: Not on file   Years of education: Not on file   Highest education level: Not on file  Occupational History   Not on file  Social Needs   Financial resource strain: Not on file   Food insecurity    Worry: Not on file    Inability: Not on file   Transportation needs    Medical: Not on file    Non-medical: Not on file  Tobacco Use   Smoking status: Former Smoker    Smokeless tobacco: Never Used  Substance and Sexual Activity   Alcohol use: No    Alcohol/week: 0.0 standard drinks   Drug use: No   Sexual activity: Not on file  Lifestyle   Physical activity    Days per week: Not on file    Minutes per session: Not on file   Stress: Not on file  Relationships   Social connections    Talks on phone: Not on file    Gets together: Not on file    Attends religious service: Not on file    Active member of club or organization: Not on file    Attends meetings of clubs or organizations: Not on file    Relationship status: Not on file   Intimate partner violence    Fear of current or ex partner: Not on file    Emotionally abused: Not on file    Physically abused: Not on file    Forced sexual activity: Not on file  Other Topics Concern   Not on file  Social History Narrative   Not on file    Family History:   Sister with aortic dissection ~58yo  Family History  Problem Relation Age of Onset   COPD Mother    COPD Father      ROS:  Please see the history of present illness.  Review of Systems  Constitutional: Positive for malaise/fatigue. Negative for chills, diaphoresis and fever.  Respiratory: Positive for shortness of breath.        No current SOB  Cardiovascular: Positive for chest pain and palpitations. Negative for leg swelling.       No current CP. Started as substernal anterior chest pain 8/13 then radiated to the left jaw and down the left arm to the level of the elbow. No current palpitations (rare).  Gastrointestinal: Negative for nausea and vomiting.  Musculoskeletal: Negative for falls and myalgias.  Neurological: Positive for loss of consciousness and weakness. Negative for dizziness.  All other systems reviewed and are negative.   All other ROS reviewed and negative.     Physical Exam/Data:   Vitals:   07/29/19 2155 07/29/19 2218 07/30/19 0253 07/30/19 0313  BP:  135/70  (!) 104/54  Pulse: 83 73  79  Resp:  (!) 24 20  20  Temp:  99 F (37.2 C)  98.4 F (36.9 C)  TempSrc:  Oral  Oral  SpO2: 94% 96%  94%  Weight:  69.4 kg 68.6 kg   Height:  5\' 3"  (1.6 m)      Intake/Output Summary (Last 24 hours) at 07/30/2019 0826 Last data filed at 07/30/2019 0208 Gross per 24 hour  Intake 67.97 ml  Output 301 ml  Net -233.03 ml   Last 3 Weights 07/30/2019 07/29/2019 07/29/2019  Weight (lbs) 151 lb 3.2 oz 152 lb 14.4 oz 150 lb  Weight (kg) 68.584 kg 69.355 kg 68.04 kg     Body mass index is 26.78 kg/m.  General:  Well nourished, well developed, in no acute distress HEENT: normal Neck: no JVD Vascular: No carotid bruits; radial pulses 2+ bilaterally Cardiac:  normal S1, S2; RRR; no murmur Lungs:  clear to auscultation bilaterally, no wheezing, rhonchi or rales  Abd: soft, nontender, no hepatomegaly  Ext: no b/l lower extremity edema Musculoskeletal:  No deformities, BUE and BLE strength normal and equal Skin: warm and dry  Neuro:  No focal abnormalities noted Psych:  Normal affect   EKG:  The EKG was personally reviewed and demonstrates:  EKG showed normal sinus rhythm, 82 bpm, poor conduction through lead III and aVL, nonspecific ST abnormality noted in the precordial leads (V3-V6) Telemetry:  Telemetry was personally reviewed and demonstrates:  NSR, 70s  Relevant CV Studies: Echo ordered yesterday 8/17 Pending cath today 8/18  Previous carotid artery [place]  07/2014 Mild plaque at both carotid bifurcations, right greater than left.  No significant carotid stenosis is identified with estimated  bilateral ICA stenoses of less than 50%.   Laboratory Data:  High Sensitivity Troponin:   Recent Labs  Lab 07/29/19 1712 07/29/19 1857 07/29/19 2248 07/30/19 0033  TROPONINIHS 160* 193* 375* 593*     Cardiac EnzymesNo results for input(s): TROPONINI in the last 168 hours. No results for input(s): TROPIPOC in the last 168 hours.  Chemistry Recent Labs  Lab 07/29/19 1712  NA 137  K 3.9  CL  106  CO2 21*  GLUCOSE 94  BUN 17  CREATININE 0.73  CALCIUM 10.4*  GFRNONAA >60  GFRAA >60  ANIONGAP 10    No results for input(s): PROT, ALBUMIN, AST, ALT, ALKPHOS, BILITOT in the last 168 hours. Hematology Recent Labs  Lab 07/29/19 1712  WBC 6.4  RBC 4.08  HGB 12.2  HCT 35.8*  MCV 87.7  MCH 29.9  MCHC 34.1  RDW 13.2  PLT 182   BNPNo results for input(s): BNP, PROBNP in the last 168 hours.  DDimer No results for input(s): DDIMER in the last 168 hours.   Radiology/Studies:  Dg Chest 2 View  Result Date: 07/29/2019 CLINICAL DATA:  Intermittent left chest pain radiating to the mandible for the past 3 days. Shortness of breath. Ex-smoker. EXAM: CHEST - 2 VIEW COMPARISON:  07/23/2014. FINDINGS: Normal sized heart. Stable linear scarring at the left lung base. Otherwise, clear lungs with normal vascularity. Minimal peribronchial thickening without significant change. Mild thoracic spine degenerative changes. IMPRESSION: No acute abnormality. Stable minimal chronic bronchitic changes. Electronically Signed   By: Beckie Salts M.D.   On: 07/29/2019 17:26    Assessment and Plan:   NSTEMI --No current CP. Risk factors for cardiac etiology include HTN, HLD, DM2, remote history of smoking. CP story as in HPI typical for that of cardiac etiology. --HS Tn 160  193  375  593. Continue to  cycle until peaked and down-trending. EKG with non-specific changes: normal sinus rhythm, 82 bpm, poor conduction through lead III and aVL, nonspecific ST abnormality noted in the precordial leads (V3-V6).  --Renal function stable with Cr 0.73. Hgb 12.2. ---Plan for cardiac catheterization today 8/18 given the above. The procedure with Risks/ Benefits/ Alternatives and Indications was reviewed with the patient.  All questions were answered. The patient voices understanding and agree to proceed.   --NPO in preparation for cardiac catheterization. Continue heparin. Continue PTA ASA 81mg , lopressor 25mg  BID,  and PTA losartan 100mg  daily. Recommend initiation of statin medication before discharge as below and after check of LFTs. Continue SL nitro / nitro patch as needed for CP. Further recommendations s/p LHC today.  HTN --Continue losartan and BB and titrate as needed for BP control. --Recommend consolidation of BB before discharge.   HLD --LDL elevated to 143.  --On PTA Welchol 312.5mg  daily (bile acid binding resin).   --Recommend instead and for more optimal control of LDL the initiation of a statin before discharge with follow-up labs. Will plan to discuss with patient (no allergy to statin listed) and also will need to check LFTs before start of statin. Recommend recheck LFTs and lipids in 6-8 weeks after start of statin.  DM2 --Hgb A1C sub-optimal at 6.5.  --On PTA metformin. Hold for 48h s/p LHC to prevent damage to renal function s/p contrast of cath.  --SSI, Per IM.  For questions or updates, please contact [facility] Please consult [email or web address] for contact info under     Signed, Lennon Alstrom, PA-C  07/30/2019 8:26 AM

## 2019-07-30 NOTE — Progress Notes (Signed)
[facility] [place] at [facility]   PATIENT NAME: Donna Harper    MR#:  [id number]  DATE OF BIRTH:  02/01/1951  SUBJECTIVE: Status post cardiac cath, patient had significant two-vessel disease status post 2 stents.  Seen in the cardiac Cath Lab.  Currently chest pain-free.  CHIEF COMPLAINT:   Chief Complaint  Patient presents with  . Chest Pain    REVIEW OF SYSTEMS:   ROS CONSTITUTIONAL: No fever, fatigue or weakness.  EYES: No blurred or double vision.  EARS, NOSE, AND THROAT: No tinnitus or ear pain.  RESPIRATORY: No cough, shortness of breath, wheezing or hemoptysis.  CARDIOVASCULAR: No chest pain, orthopnea, edema.  GASTROINTESTINAL: No nausea, vomiting, diar normal bowel purpura, yellow rhea or abdominal pain.  GENITOURINARY: No dysuria, hematuria.  ENDOCRINE: No polyuria, nocturia,  HEMATOLOGY: No anemia, easy bruising or bleeding SKIN: No rash or lesion. MUSCULOSKELETAL: No joint pain or arthritis.   NEUROLOGIC: No tingling, numbness, weakness.  PSYCHIATRY: No anxiety or depression.    DRUG ALLERGIES:   Allergies  Allergen Reactions  . Erythromycin     Stomach Problems'   . Levaquin [Levofloxacin In D5w]   . Penicillins     Heart races  . Tequin [Gatifloxacin]   . Zoloft [Sertraline Hcl]   . Zithromax [Azithromycin] Palpitations    Heart race    VITALS:  Blood pressure 135/79, pulse 76, temperature 98.3 F (36.8 C), temperature source Oral, resp. rate 20, height 5\' 3"  (1.6 m), weight 68.6 kg, SpO2 96 %.  PHYSICAL EXAMINATION:  GENERAL:  68 y.o.-year-old patient lying in the bed with no acute distress.  EYES: Pupils equal, round, reactive to light and accommodation. No scleral icterus. Extraocular muscles intact.  HEENT: Head atraumatic, normocephalic. Oropharynx and nasopharynx clear.  NECK:  Supple, no jugular venous distention. No thyroid enlargement, no tenderness.  LUNGS: Normal breath sounds bilaterally, no wheezing,  rales,rhonchi or crepitation. No use of accessory muscles of respiration.  CARDIOVASCULAR: S1, S2 normal. No murmurs, rubs, or gallops.  ABDOMEN: Soft, nontender, nondistended. Bowel sounds present. No organomegaly or mass.  EXTREMITIES: No pedal edema, cyanosis, or clubbing.  NEUROLOGIC: Cranial nerves II through XII are intact. Muscle strength 5/5 in all extremities. Sensation intact. Gait not checked.  PSYCHIATRIC: The patient is alert and oriented x 3.  SKIN: No obvious rash, lesion, or ulcer.    LABORATORY PANEL:   CBC Recent Labs  Lab 07/29/19 1712  WBC 6.4  HGB 12.2  HCT 35.8*  PLT 182   ------------------------------------------------------------------------------------------------------------------  Chemistries  Recent Labs  Lab 07/29/19 1712  NA 137  K 3.9  CL 106  CO2 21*  GLUCOSE 94  BUN 17  CREATININE 0.73  CALCIUM 10.4*   ------------------------------------------------------------------------------------------------------------------  Cardiac Enzymes No results for input(s): TROPONINI in the last 168 hours. ------------------------------------------------------------------------------------------------------------------  RADIOLOGY:  Dg Chest 2 View  Result Date: 07/29/2019 CLINICAL DATA:  Intermittent left chest pain radiating to the mandible for the past 3 days. Shortness of breath. Ex-smoker. EXAM: CHEST - 2 VIEW COMPARISON:  07/23/2014. FINDINGS: Normal sized heart. Stable linear scarring at the left lung base. Otherwise, clear lungs with normal vascularity. Minimal peribronchial thickening without significant change. Mild thoracic spine degenerative changes. IMPRESSION: No acute abnormality. Stable minimal chronic bronchitic changes. Electronically Signed   By: Beckie Salts M.D.   On: 07/29/2019 17:26    EKG:   Orders placed or performed during the hospital encounter of 07/29/19  . EKG 12-Lead  . EKG 12-Lead  .  ED EKG  . ED EKG  . EKG 12-Lead  (recurrent chest pain)  . EKG 12-Lead (at 6am)  . EKG 12-Lead (at 6am)    ASSESSMENT AND PLAN:   #1 acute chest pain secondary to non-ST elevation MI status post cardiac cath showing significant two-vessel disease with multifocal mid LAD disease up to 60 to 70%, 95% mid RCA stenosis.  Patient had successful PCI to mid RCA with drug-eluting stent.  Continue dual antiplatelet therapy with aspirin, Brilinta for at least 12 months. #2 .hyperlipidemia, on high intensity statins, #3. diabetes mellitus type 2, continue sliding scale insulin, hold metformin for 48 hours due to contrast received for cardiac cath.  Restart glipizide tomorrow.   All the records are reviewed and case discussed with Care Management/Social Workerr. Management plans discussed with the patient, family and they are in agreement.  CODE STATUS: full code  TOTAL TIME TAKING CARE OF THIS PATIENT: 35 minutes.   POSSIBLE D/C IN 1-2 DAYS, DEPENDING ON CLINICAL CONDITION.   Katha Hamming M.D on 07/30/2019 at 1:31 PM  Between 7am to 6pm - Pager - [phone]  After 6pm go to [email or web address] - password [email or web address] [facility]  [place] [facility]  Office  [phone]  CC: Primary care physician; Corky Downs, MD   Note: This dictation was prepared with Dragon dictation along with smaller phrase technology. Any transcriptional errors that result from this process are unintentional.

## 2019-07-30 NOTE — Consult Note (Signed)
Cardiology Consultation:   Patient ID: Donna Harper MRN: [id number]; DOB: [birth year]  Admit date: 07/29/2019 Date of Consult: 07/30/2019  Primary Care Provider: Corky Downs, MD Primary Cardiologist:[facility], Dr. Okey Dupre rounding Primary Electrophysiologist:  None    Patient Profile:   Donna Harper is a 68 y.o. female with a hx of HLD, HTN, DM2 (A1C 6.5), GERD, TIA, remote history of tobacco abuse, and who is being seen today for the evaluation of chest pain at the request of Janeann Merl, NP.  History of Present Illness:   Donna Harper is a 68 year old female with PMH as above and no current history of tobacco, alcohol, or drug abuse.  Per review of EMR, she does have a remote history of smoking.  She is followed by Dr. Harl Bowie, PCP.  Family history includes a father with CABG and valve replacement and a sister, deceased at approximately age 16 of aortic dissection.  She reportedly lives at home with her husband, who suffers from muscular dystrophy; therefore, she does the majority of the household work.  She denies any past history of cardiac disease other than HTN, HLD, and the rare palpitation while watching television or sitting quietly.  Recently, she has noticed that she becomes short of breath when ambulating short distances.  She recently moved into a smaller and more handicapped friendly home for her husband, and reported that the distance from the great room to kitchen is quite short; however, she notices that she becomes short of breath when even going to the kitchen.    Last Wednesday, 07/24/2019, she noticed that she was more fatigued than usual.  She thought that it was likely due to the heat and humidity, however.  On Thursdays, she normally mows the lawn.  This past Thursday, 07/25/2019 started with the riding mower and transition to the push mower at which time she felt chest pain and shortness of breath.  The chest pain was mainly located in her anterior/substernal chest at that  point, and it was described as a chest pressure and burning sensation.  She reported that she had never experienced chest pain like this before, including the previous week when she had mowed the lawn.  She also stated that she felt as if she was going to pass out; therefore, she came in to have a glass of water.  She reported activity/ambulation made the chest pain worse and rest made the chest pain better.  From that point forward, she noted chest pain that waxed and waned over the next few days.  Each episode of chest pain lasted approximately 15 minutes and was rated 6/10 in severity.  She denied any associated nausea or emesis.  Associated symptoms included severe fatigue and shortness of breath (worse with ambulation).  On Friday, 07/26/2019, her chest pain moved into her left jaw and down her left arm, stopping at the level of the elbow.  She decided not to come into the emergency department, however, as it was the weekend.  Then, on Monday, 07/29/2019, she reported to [facility] emergency department.  In the emergency department, vitals were significant for BP 168/81, HR 90 bpm, T98.56F, SpO2 96%.  Initial COVID-19.  Labs significant for sodium 137, potassium 3.9, creatinine 0.73, calcium 10.4, WBC 6.4, Hgb 12.2, RBC 4.08, HS Tn 160  193  375  593, HDL 47, LDL 143, Tg 372, total cholesterol 264, Hgb A1C 6.5. CXR showed no acute abnormality with stable minimal bronchitic changes. EKG showed normal sinus rhythm, 82 bpm, poor  conduction through lead III and aVL, nonspecific ST abnormality noted in the precordial leads (V3-V6).  During evaluation today, she denies current CP, SOB, palpitations, feeling of racing HR. She continues to note fatigue, which she stated may be due to a poor night's sleep. She is scheduled for LHC later today 8/18. The procedure with Risks/Benefits/Alternatives and Indications was reviewed with the patient.  All questions were answered.  Risks / Complications include, but not limited to:  Death, MI, CVA/TIA, VF/VT (with defibrillation), Bradycardia (need for temporary pacer placement), contrast induced nephropathy, bleeding / bruising / hematoma / pseudoaneurysm, radiation-related injury in the case of prolonged fluoroscopy use, vascular or coronary injury (with possible emergent CT or Vascular Surgery), adverse medication reactions, infection.  The patient understands the risks of serious complication is 1-2 in 1000 with diagnostic cardiac cath and 1-2% or less with angioplasty/stenting. The patient voices understanding and agree to proceed.     Heart Pathway Score:     Past Medical History:  Diagnosis Date   Diabetes mellitus without complication (HCC)    GERD (gastroesophageal reflux disease)    Joint pain    Murmur    Stroke [facility]) Sept 2015   TIA    Past Surgical History:  Procedure Laterality Date   BREAST BIOPSY Bilateral 1988   benign/Dr Sankar   CESAREAN SECTION  1986     Home Medications:  Prior to Admission medications   Medication Sig Start Date End Date Taking? Authorizing Provider  aspirin 81 MG tablet Take 81 mg by mouth daily.   Yes [provider]  cholecalciferol (VITAMIN D) 1000 UNITS tablet Take 1,000 Units by mouth daily.   Yes [provider]  diphenhydrAMINE (BENADRYL) 25 MG tablet Take 25 mg by mouth at bedtime.   Yes [provider]  glipiZIDE (GLUCOTROL XL) 5 MG 24 hr tablet Take 5 mg by mouth daily. 07/23/19  Yes [provider]  ibuprofen (ADVIL) 400 MG tablet Take 800 mg by mouth every 6 (six) hours as needed. Take two tablets in the morning and two tablets in the evening   Yes [provider]  losartan (COZAAR) 100 MG tablet Take 100 mg by mouth daily. 05/20/19  Yes [provider]  metFORMIN (GLUCOPHAGE) 1000 MG tablet Take 1,000 mg by mouth 2 (two) times daily with a meal.  11/02/14  Yes [provider]  Multiple Vitamin (MULTIVITAMIN WITH MINERALS) TABS tablet Take 1  tablet by mouth daily.   Yes [provider]  vitamin E 400 UNIT capsule Take 400 Units by mouth daily.   Yes [provider]  colesevelam (WELCHOL) 625 MG tablet Take 312.5 mg by mouth daily with breakfast.    [provider]  NASONEX 50 MCG/ACT nasal spray Place 2 sprays into the nose daily.  10/29/14   [provider]  nystatin (MYCOSTATIN) 100000 UNIT/ML suspension Take 5 mLs by mouth as needed.    [provider]    Inpatient Medications: Scheduled Meds:  aspirin EC  81 mg Oral Daily   colesevelam  312.5 mg Oral Q breakfast   fluticasone  1 spray Each Nare Daily   glipiZIDE  5 mg Oral Daily   insulin aspart  0-15 Units Subcutaneous TID WC   insulin aspart  0-5 Units Subcutaneous QHS   losartan  100 mg Oral Daily   metFORMIN  1,000 mg Oral BID WC   metoprolol tartrate  25 mg Oral BID    morphine injection  1 mg  Intravenous NOW   [START ON 07/31/2019] pneumococcal 23 valent vaccine  0.5 mL Intramuscular Tomorrow-1000   Continuous Infusions:  heparin 950 Units/hr (07/30/19 0323)   PRN Meds: acetaminophen, ondansetron (ZOFRAN) IV, traMADol  Allergies:    Allergies  Allergen Reactions   Erythromycin     Stomach Problems'    Levaquin [Levofloxacin In D5w]    Penicillins     Heart races   Tequin [Gatifloxacin]    Zoloft [Sertraline Hcl]    Zithromax [Azithromycin] Palpitations    Heart race    Social History:   Social History   Socioeconomic History   Marital status: Married    Spouse name: Not on file   Number of children: Not on file   Years of education: Not on file   Highest education level: Not on file  Occupational History   Not on file  Social Needs   Financial resource strain: Not on file   Food insecurity    Worry: Not on file    Inability: Not on file   Transportation needs    Medical: Not on file    Non-medical: Not on file  Tobacco Use   Smoking status: Former Smoker    Smokeless tobacco: Never Used  Substance and Sexual Activity   Alcohol use: No    Alcohol/week: 0.0 standard drinks   Drug use: No   Sexual activity: Not on file  Lifestyle   Physical activity    Days per week: Not on file    Minutes per session: Not on file   Stress: Not on file  Relationships   Social connections    Talks on phone: Not on file    Gets together: Not on file    Attends religious service: Not on file    Active member of club or organization: Not on file    Attends meetings of clubs or organizations: Not on file    Relationship status: Not on file   Intimate partner violence    Fear of current or ex partner: Not on file    Emotionally abused: Not on file    Physically abused: Not on file    Forced sexual activity: Not on file  Other Topics Concern   Not on file  Social History Narrative   Not on file    Family History:   Sister with aortic dissection ~58yo  Family History  Problem Relation Age of Onset   COPD Mother    COPD Father      ROS:  Please see the history of present illness.  Review of Systems  Constitutional: Positive for malaise/fatigue. Negative for chills, diaphoresis and fever.  Respiratory: Positive for shortness of breath.        No current SOB  Cardiovascular: Positive for chest pain and palpitations. Negative for leg swelling.       No current CP. Started as substernal anterior chest pain 8/13 then radiated to the left jaw and down the left arm to the level of the elbow. No current palpitations (rare).  Gastrointestinal: Negative for nausea and vomiting.  Musculoskeletal: Negative for falls and myalgias.  Neurological: Positive for loss of consciousness and weakness. Negative for dizziness.  All other systems reviewed and are negative.   All other ROS reviewed and negative.     Physical Exam/Data:   Vitals:   07/29/19 2155 07/29/19 2218 07/30/19 0253 07/30/19 0313  BP:  135/70  (!) 104/54  Pulse: 83 73  79  Resp:  (!) 24 20  20  Temp:  99 F (37.2 C)  98.4 F (36.9 C)  TempSrc:  Oral  Oral  SpO2: 94% 96%  94%  Weight:  69.4 kg 68.6 kg   Height:  5\' 3"  (1.6 m)      Intake/Output Summary (Last 24 hours) at 07/30/2019 0826 Last data filed at 07/30/2019 0208 Gross per 24 hour  Intake 67.97 ml  Output 301 ml  Net -233.03 ml   Last 3 Weights 07/30/2019 07/29/2019 07/29/2019  Weight (lbs) 151 lb 3.2 oz 152 lb 14.4 oz 150 lb  Weight (kg) 68.584 kg 69.355 kg 68.04 kg     Body mass index is 26.78 kg/m.  General:  Well nourished, well developed, in no acute distress HEENT: normal Neck: no JVD Vascular: No carotid bruits; radial pulses 2+ bilaterally Cardiac:  normal S1, S2; RRR; no murmur Lungs:  clear to auscultation bilaterally, no wheezing, rhonchi or rales  Abd: soft, nontender, no hepatomegaly  Ext: no b/l lower extremity edema Musculoskeletal:  No deformities, BUE and BLE strength normal and equal Skin: warm and dry  Neuro:  No focal abnormalities noted Psych:  Normal affect   EKG:  The EKG was personally reviewed and demonstrates:  EKG showed normal sinus rhythm, 82 bpm, poor conduction through lead III and aVL, nonspecific ST abnormality noted in the precordial leads (V3-V6) Telemetry:  Telemetry was personally reviewed and demonstrates:  NSR, 70s  Relevant CV Studies: Echo ordered yesterday 8/17 Pending cath today 8/18  Previous carotid artery [place]  07/2014 Mild plaque at both carotid bifurcations, right greater than left.  No significant carotid stenosis is identified with estimated  bilateral ICA stenoses of less than 50%.   Laboratory Data:  High Sensitivity Troponin:   Recent Labs  Lab 07/29/19 1712 07/29/19 1857 07/29/19 2248 07/30/19 0033  TROPONINIHS 160* 193* 375* 593*     Cardiac EnzymesNo results for input(s): TROPONINI in the last 168 hours. No results for input(s): TROPIPOC in the last 168 hours.  Chemistry Recent Labs  Lab 07/29/19 1712  NA 137  K 3.9  CL  106  CO2 21*  GLUCOSE 94  BUN 17  CREATININE 0.73  CALCIUM 10.4*  GFRNONAA >60  GFRAA >60  ANIONGAP 10    No results for input(s): PROT, ALBUMIN, AST, ALT, ALKPHOS, BILITOT in the last 168 hours. Hematology Recent Labs  Lab 07/29/19 1712  WBC 6.4  RBC 4.08  HGB 12.2  HCT 35.8*  MCV 87.7  MCH 29.9  MCHC 34.1  RDW 13.2  PLT 182   BNPNo results for input(s): BNP, PROBNP in the last 168 hours.  DDimer No results for input(s): DDIMER in the last 168 hours.   Radiology/Studies:  Dg Chest 2 View  Result Date: 07/29/2019 CLINICAL DATA:  Intermittent left chest pain radiating to the mandible for the past 3 days. Shortness of breath. Ex-smoker. EXAM: CHEST - 2 VIEW COMPARISON:  07/23/2014. FINDINGS: Normal sized heart. Stable linear scarring at the left lung base. Otherwise, clear lungs with normal vascularity. Minimal peribronchial thickening without significant change. Mild thoracic spine degenerative changes. IMPRESSION: No acute abnormality. Stable minimal chronic bronchitic changes. Electronically Signed   By: Beckie Salts M.D.   On: 07/29/2019 17:26    Assessment and Plan:   NSTEMI --No current CP. Risk factors for cardiac etiology include HTN, HLD, DM2, remote history of smoking. CP story as in HPI typical for that of cardiac etiology. --HS Tn 160  193  375  593. Continue to  cycle until peaked and down-trending. EKG with non-specific changes: normal sinus rhythm, 82 bpm, poor conduction through lead III and aVL, nonspecific ST abnormality noted in the precordial leads (V3-V6).  --Renal function stable with Cr 0.73. Hgb 12.2. ---Plan for cardiac catheterization today 8/18 given the above. The procedure with Risks/ Benefits/ Alternatives and Indications was reviewed with the patient.  All questions were answered. The patient voices understanding and agree to proceed.   --NPO in preparation for cardiac catheterization. Continue heparin. Continue PTA ASA 81mg , lopressor 25mg  BID,  and PTA losartan 100mg  daily. Recommend initiation of statin medication before discharge as below and after check of LFTs. Continue SL nitro / nitro patch as needed for CP. Further recommendations s/p LHC today.  HTN --Continue losartan and BB and titrate as needed for BP control. --Recommend consolidation of BB before discharge.   HLD --LDL elevated to 143.  --On PTA Welchol 312.5mg  daily (bile acid binding resin).   --Recommend instead and for more optimal control of LDL the initiation of a statin before discharge with follow-up labs. Will plan to discuss with patient (no allergy to statin listed) and also will need to check LFTs before start of statin. Recommend recheck LFTs and lipids in 6-8 weeks after start of statin.  DM2 --Hgb A1C sub-optimal at 6.5.  --On PTA metformin. Hold for 48h s/p LHC to prevent damage to renal function s/p contrast of cath.  --SSI, Per IM.  For questions or updates, please contact [facility] Please consult [email or web address] for contact info under     Signed, Lennon Alstrom, PA-C  07/30/2019 8:26 AM

## 2019-07-30 NOTE — Progress Notes (Signed)
Pressure held to site for about 25 minutes per Dr. Saunders Revel. Gauze tegaderm dressing placed. Per MD to monitor for 20 minutes and send to telemetry. Report called to [facility] on 2A

## 2019-07-30 NOTE — Progress Notes (Signed)
Patient returned from cath lab via bed.  Report given to this RN by Nigel Mormon. Patient with R radial cath site.  Bruising noted around site, area soft.  +CMST noted. Patient verbalizes understanding to call if bleeding noted.

## 2019-07-30 NOTE — Progress Notes (Signed)
Pt remains with oozing at right wrist. Dr. Saunders Revel paged and updated at 1800. MD to bedside. TR band removed and MD holding manual pressure to site at this time. Will monitor.

## 2019-07-30 NOTE — Plan of Care (Signed)

## 2019-07-31 ENCOUNTER — Telehealth: Payer: Self-pay | Admitting: Internal Medicine

## 2019-07-31 ENCOUNTER — Encounter: Payer: Self-pay | Admitting: Internal Medicine

## 2019-07-31 ENCOUNTER — Inpatient Hospital Stay (HOSPITAL_COMMUNITY)
Admit: 2019-07-31 | Discharge: 2019-07-31 | Disposition: A | Discharge status: Home / Self Care | Payer: Medicare Other | Attending: Internal Medicine | Admitting: Internal Medicine

## 2019-07-31 DIAGNOSIS — I255 Ischemic cardiomyopathy: Secondary | ICD-10-CM

## 2019-07-31 DIAGNOSIS — E785 Hyperlipidemia, unspecified: Secondary | ICD-10-CM

## 2019-07-31 DIAGNOSIS — R0789 Other chest pain: Secondary | ICD-10-CM

## 2019-07-31 DIAGNOSIS — I10 Essential (primary) hypertension: Secondary | ICD-10-CM

## 2019-07-31 LAB — ECHOCARDIOGRAM COMPLETE
Height: 63 in
Weight: 2401.6 oz

## 2019-07-31 LAB — CBC
HCT: 32.5 % — ABNORMAL LOW (ref 36.0–46.0)
Hemoglobin: 11.2 g/dL — ABNORMAL LOW (ref 12.0–15.0)
MCH: 30.2 pg (ref 26.0–34.0)
MCHC: 34.5 g/dL (ref 30.0–36.0)
MCV: 87.6 fL (ref 80.0–100.0)
Platelets: 166 10*3/uL (ref 150–400)
RBC: 3.71 MIL/uL — ABNORMAL LOW (ref 3.87–5.11)
RDW: 13.2 % (ref 11.5–15.5)
WBC: 5.9 10*3/uL (ref 4.0–10.5)
nRBC: 0 % (ref 0.0–0.2)

## 2019-07-31 LAB — HIV ANTIBODY (ROUTINE TESTING W REFLEX): HIV Screen 4th Generation wRfx: NONREACTIVE

## 2019-07-31 LAB — BASIC METABOLIC PANEL
Anion gap: 8 (ref 5–15)
BUN: 15 mg/dL (ref 8–23)
CO2: 26 mmol/L (ref 22–32)
Calcium: 9.5 mg/dL (ref 8.9–10.3)
Chloride: 106 mmol/L (ref 98–111)
Creatinine, Ser: 0.71 mg/dL (ref 0.44–1.00)
GFR calc Af Amer: >60 mL/min (ref >60)
GFR calc non Af Amer: >60 mL/min (ref >60)
Glucose, Bld: 159 mg/dL — ABNORMAL HIGH (ref 70–99)
Potassium: 3.4 mmol/L — ABNORMAL LOW (ref 3.5–5.1)
Sodium: 140 mmol/L (ref 135–145)

## 2019-07-31 LAB — GLUCOSE, CAPILLARY
Glucose-Capillary: 173 mg/dL — ABNORMAL HIGH (ref 70–99)
Glucose-Capillary: 262 mg/dL — ABNORMAL HIGH (ref 70–99)

## 2019-07-31 MED ORDER — ROSUVASTATIN CALCIUM 5 MG PO TABS
5.0000 mg | ORAL_TABLET | ORAL | Status: DC
  Administered 2019-07-31: 5 mg via ORAL
  Filled 2019-07-31: qty 1

## 2019-07-31 MED ORDER — GLIPIZIDE ER 5 MG PO TB24
5.0000 mg | ORAL_TABLET | Freq: Every day | ORAL | Status: DC
  Administered 2019-07-31: 5 mg via ORAL
  Filled 2019-07-31: qty 1

## 2019-07-31 MED ORDER — METOPROLOL TARTRATE 25 MG PO TABS: 25.0000 mg | ORAL_TABLET | Freq: Two times a day (BID) | ORAL | 0 refills | Status: DC

## 2019-07-31 MED ORDER — TICAGRELOR 90 MG PO TABS: 90.0000 mg | ORAL_TABLET | Freq: Two times a day (BID) | ORAL | 0 refills | Status: DC

## 2019-07-31 MED ORDER — ROSUVASTATIN CALCIUM 5 MG PO TABS: 5.0000 mg | ORAL_TABLET | ORAL | 0 refills | Status: DC

## 2019-07-31 NOTE — Progress Notes (Signed)
Cardiovascular and Pulmonary Nurse Navigator Note:   68 year old female with hx of GERD, TIA, and DM, who presented to the ER with chest pain.  Troponin peaked at 593.  Patient underwent Cardiac Cath on Tuesday, July 30, 2019.    End, Harrell Gave, MD (Primary)  End, Harrell Gave, MD  Procedures  CORONARY STENT INTERVENTION  LEFT HEART CATH AND CORONARY ANGIOGRAPHY   Conclusions: 1. Significant two-vessel coronary artery disease with multifocal mid LAD disease of up to 60-70% and 95% mid RCA stenosis. 2. Basal inferior hypokinesis with otherwise preserved left ventricular systolic function; LVEF 56-43%. 3. Normal left ventricular filling pressure. 4. Successful PCI to mid RCA using Resolute Onyx 3.0 x 12 mm DES with 0% residual stenosis and TIMI-3 flow.  Recommendations: 1. Complete 2 hours of cangrelor infusion. 2. Dual antiplatelet therapy with aspirin and ticagrelor for at least 12 months. 3. Aggressive secondary prevention.  Consider trial of PCSK9 inhibitor as an outpatient, given history of statin intolerance.  Nelva Bush, MD [facility] Pager: [phone]   EDUCATION:   Rounded on patient.  Patient sitting up on side of bed.   "Heart Attack Bouncing Back" booklet given and reviewed with patient. Discussed the definition of CAD. Reviewed the location of CAD and where his / her stent was placed. Informed patient she will be given a stent card. Explained the purpose of the stent card. Instructed patient to keep stent card in her wallet.  Discussed modifiable risk factors including controlling blood pressure, cholesterol, and blood sugar; following heart healthy diet; maintaining healthy weight; exercise; and smoking cessation, if applicable.  Discussed cardiac medications including rationale for taking, mechanisms of action, and side effects. Stressed the importance of taking medications as prescribed.  Patient currently on the following cardiac medications:  ASA,  Brilinta, Losartan, Metoprolol, and Crestor.  Patient has hx of statin intolerance. Cardiologist started patient on Crestor low dose.    Discussed emergency plan for heart attack symptoms. Patient verbalized understanding of need to call 911 and not to drive herself to ER if having chest pain.    Diet of low sodium, low fat, low cholesterol heart healthy / carb modified diet discussed. Information on diet provided.   Smoking Cessation - Patient is a FORMER smoker.     Exercise - Benefits of exercised discussed. 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  Informed patient her cardiologist has referred her to outpatient Cardiac Rehab. An overview of the program was provided. Brochure, informational letter, and CPT billing codes given to patient. Patient is interested in participating. Patient plans to check with her insurance company to see what her out-of-pocket expenses will be. Best phone number to call is her cell:  [phone].  Patient agreeable to being called by phone by the Cardiac Rehab dept within one to two weeks of discharge to schedule her first appointment.  Patient understands she will need to wear a mask due to the COVID-19 pandemic.  Patient asked if she would need to be re-tested for COVID prior to starting Cardiac Rehab.  This RN informed her that she would not need to be tested.  Patient to follow-up with Cardiologist next week.    Patient appreciative of the above information.   Roanna Epley, RN, BSN, [facility]  Cardiovascular & Pulmonary Nurse Navigator  Direct Line: [phone]  Department Phone #:  [phone] Fax: [phone]  Email Address: [email or web address]

## 2019-07-31 NOTE — Progress Notes (Signed)
Progress Note  Patient Name: Donna Harper Date of Encounter: 07/31/2019  Primary Cardiologist: New - Kal Chait  Subjective   Patient feels well this morning.  No chest pain, shortness of breath, or palpitations.  No pain at right radial arteriotomy site, the right forearm is still somewhat tender to palpation.  Inpatient Medications    Scheduled Meds: . aspirin EC  81 mg Oral Daily  . colesevelam  312.5 mg Oral Q breakfast  . enoxaparin (LOVENOX) injection  40 mg Subcutaneous Q24H  . fluticasone  1 spray Each Nare Daily  . insulin aspart  0-15 Units Subcutaneous TID WC  . insulin aspart  0-5 Units Subcutaneous QHS  . losartan  100 mg Oral Daily  . metoprolol tartrate  25 mg Oral BID  . pneumococcal 23 valent vaccine  0.5 mL Intramuscular Tomorrow-1000  . rosuvastatin  5 mg Oral Q M,W,F  . sodium chloride flush  3 mL Intravenous Q12H  . ticagrelor  90 mg Oral BID   Continuous Infusions: . sodium chloride     PRN Meds: sodium chloride, acetaminophen, diphenhydrAMINE, nitroGLYCERIN, ondansetron (ZOFRAN) IV, sodium chloride flush, traMADol   Vital Signs    Vitals:   07/30/19 1830 07/30/19 1912 07/31/19 0326 07/31/19 0753  BP: 134/72 138/72 132/70 129/79  Pulse: 84 90 78 78  Resp: 14 17    Temp:  99 F (37.2 C) 98.5 F (36.9 C) 98.2 F (36.8 C)  TempSrc:  Oral Oral Oral  SpO2: 95% 97% 93% 93%  Weight:   68.1 kg   Height:       No intake or output data in the 24 hours ending 07/31/19 1050 Last 3 Weights 07/31/2019 07/30/2019 07/30/2019  Weight (lbs) 150 lb 1.6 oz 151 lb 3.8 oz 151 lb 3.2 oz  Weight (kg) 68.085 kg 68.6 kg 68.584 kg      Telemetry    Sinus rhythm. - Personally Reviewed  ECG    Normal sinus rhythm with nonspecific ST/T changes. - Personally Reviewed  Physical Exam   GEN: No acute distress.   Neck: No JVD Cardiac: RRR, no murmurs, rubs, or gallops.  Respiratory: Clear to auscultation bilaterally. GI: Soft, nontender, non-distended  MS: No  edema; No deformity.  Radial arteriotomy site covered with clean dressing.  Scant bruising noted.  No hematoma.  2+ right radial pulse. Neuro:  Nonfocal  Psych: Normal affect   Labs    High Sensitivity Troponin:   Recent Labs  Lab 07/29/19 1712 07/29/19 1857 07/29/19 2248 07/30/19 0033  TROPONINIHS 160* 193* 375* 593*      Cardiac EnzymesNo results for input(s): TROPONINI in the last 168 hours. No results for input(s): TROPIPOC in the last 168 hours.   Chemistry Recent Labs  Lab 07/29/19 1712 07/31/19 0526  NA 137 140  K 3.9 3.4*  CL 106 106  CO2 21* 26  GLUCOSE 94 159*  BUN 17 15  CREATININE 0.73 0.71  CALCIUM 10.4* 9.5  GFRNONAA >60 >60  GFRAA >60 >60  ANIONGAP 10 8     Hematology Recent Labs  Lab 07/29/19 1712 07/30/19 1910 07/31/19 0526  WBC 6.4 6.6 5.9  RBC 4.08 3.83* 3.71*  HGB 12.2 11.4* 11.2*  HCT 35.8* 33.2* 32.5*  MCV 87.7 86.7 87.6  MCH 29.9 29.8 30.2  MCHC 34.1 34.3 34.5  RDW 13.2 13.1 13.2  PLT 182 175 166    BNPNo results for input(s): BNP, PROBNP in the last 168 hours.   DDimer No results  for input(s): DDIMER in the last 168 hours.   Radiology    Dg Chest 2 View  Result Date: 07/29/2019 CLINICAL DATA:  Intermittent left chest pain radiating to the mandible for the past 3 days. Shortness of breath. Ex-smoker. EXAM: CHEST - 2 VIEW COMPARISON:  07/23/2014. FINDINGS: Normal sized heart. Stable linear scarring at the left lung base. Otherwise, clear lungs with normal vascularity. Minimal peribronchial thickening without significant change. Mild thoracic spine degenerative changes. IMPRESSION: No acute abnormality. Stable minimal chronic bronchitic changes. Electronically Signed   By: Claudie Revering M.D.   On: 07/29/2019 17:26    Cardiac Studies   Echo (07/31/2019):  1. Severe hypokinesis of the left ventricular, basal inferior segment.  2. The left ventricle has normal systolic function, with an ejection fraction of 55-60%. The cavity size  was normal. There is mildly increased left ventricular wall thickness. Left ventricular diastolic Doppler parameters are consistent with impaired  relaxation. Elevated mean left atrial pressure.  3. The right ventricle has normal systolic function. The cavity was normal. There is no increase in right ventricular wall thickness. Right ventricular systolic pressure could not be assessed.  4. The aortic valve has an indeterminate number of cusps. Mild thickening of the aortic valve.  5. The aorta is normal unless otherwise noted.  6. The interatrial septum was not well visualized.  LHC/PCI (07/30/2019): 1. Significant two-vessel coronary artery disease with multifocal mid LAD disease of up to 60-70% and 95% mid RCA stenosis. 2. Basal inferior hypokinesis with otherwise preserved left ventricular systolic function; LVEF 31-54%. 3. Normal left ventricular filling pressure. 4. Successful PCI to mid RCA using Resolute Onyx 3.0 x 12 mm DES with 0% residual stenosis and TIMI-3 flow.  Patient Profile     68 y.o. female with history of lipidemia, type 2 diabetes mellitus, and TIA, admitted with NSTEMI.  Assessment & Plan    NSTEMI: Patient chest pain-free following PCI to 95% mid RCA stenosis.  Moderate, noncritical LAD disease also noted but not intervened upon at the time of catheterization.  Continue dual antiplatelet therapy with aspirin and ticagrelor for 12 months.  If patient has recurrent significant dyspnea while on ticagrelor, transition to clopidogrel could be considered in the future.  Aggressive secondary prevention.  Favor medical management of LAD disease unless patient has refractory angina and objective evidence of ischemia.  Outpatient cardiac rehab.  Ischemic cardiomyopathy: Patient appears euvolemic and well compensated.  LVEF normal by echo this morning with basal inferior hypokinesis.  Continue current doses of metoprolol tartrate and losartan.  Hyperlipidemia: LDL poorly  controlled with history of statin intolerance.  We will start rosuvastatin 5 mg on Mondays, Wednesdays, and Fridays.  If patient is intolerant of this or does not achieve adequate lipid control (goal LDL less than 70), we will need to consider initiation of a PCSK9 inhibitor as an outpatient.  Hypertension: Blood pressure adequately controlled.  Continue current doses of losartan and metoprolol.  Disposition: Patient is stable for discharge home today.  She should follow-up with [place] in 1 to 2 weeks in the office.  For questions or updates, please contact [place] Please consult [email or web address] for contact info under [facility].     Signed, Nelva Bush, MD  07/31/2019, 10:50 AM

## 2019-07-31 NOTE — Telephone Encounter (Signed)
TCM....  Patient is being discharged 07/31/19   They saw End  They are scheduled to see Murray Hodgkins on 9/2 at 10 am   They were seen for NSTEMI  They need to be seen within 1-2 weeks  Pt not placed on wait list   Please call

## 2019-07-31 NOTE — Progress Notes (Signed)
*  PRELIMINARY RESULTS* Echocardiogram 2D Echocardiogram has been performed.  Donna Harper 07/31/2019, 9:01 AM

## 2019-07-31 NOTE — Telephone Encounter (Signed)
-----   Message from Nelva Bush, MD sent at 07/31/2019 10:56 AM EDT ----- Regarding: TCM Hello,  Ms. Wallis was admitted with NSTEMI and will be discharged later today.  Can you help arrange for a TCM follow-up with me or an APP in 1-2 weeks?  Thanks.  Gerald Stabs

## 2019-07-31 NOTE — Clinical Social Work Note (Signed)
CSW was informed that patient will need a Brilenta coupon.  CSW provided patient with Brilenta coupon, and informed her to talk to her cardiologist about other coupons or assistance at her follow up appointment.  CSW signing off please reconsult if social work needs arise.  Jones Broom. Takeesha Isley, MSW, [place]  07/31/2019 4:11 PM

## 2019-07-31 NOTE — Progress Notes (Signed)
Patient is stable for discharge, patient is to continue on dual antiplatelet therapy for drug-eluting stent that was placed in mid RCA, aggressive secondary prevention, outpatient cardiac rehab, can continue metoprolol, losartan, Crestor on alternate day, can start metformin after 2 days due to contrast today for cardiac cath, continue glipizide.  Discussed with patient.  Stable for discharge.

## 2019-08-01 NOTE — Telephone Encounter (Signed)
TCM- 1st attempt  Attempted to call the patient at her home #. No answer & no voice mail. Attempted to call her cell #. No answer- I left a message to please call back.

## 2019-08-02 NOTE — Telephone Encounter (Signed)
Patient contacted regarding discharge from [facility] on 07/31/19.  Patient understands to follow up with provider Sharolyn Douglas on 08/14/19 at 10 am at [facility]. Patient understands discharge instructions? yes Patient understands medications and regiment?    No, patient had questions about metformin and welchol. Found in Dr Governor Specking progress note on 07/31/19 that ok for patient to resume metformin in 2 days after the cath which would be today. Regarding Welchol, it is on her current med list; however, she did not receive a prescription for it. She has the Crestor prescription. Advised I will route to Dr End for further advice on the [facility]. She was appreciative.    Patient understands to bring all medications to this visit? yes

## 2019-08-02 NOTE — Telephone Encounter (Signed)
No need to take Welchol.  She should remain on rosuvastatin; we will f/u her lipids in a couple of months.  Thanks.  Nelva Bush, MD [facility] Pager: [phone]

## 2019-08-02 NOTE — Telephone Encounter (Signed)
Patient returning call Needs to clarify medications Please call to discuss

## 2019-08-02 NOTE — Discharge Summary (Signed)
Donna Harper, is a 68 y.o. female  DOB [birth year]  MRN [id number].  Admission date:  07/29/2019  Admitting Physician  Jude Enid Baas, MD  Discharge Date:  07/31/2019   Primary MD  Corky Downs, MD  Recommendations for primary care physician for things to follow:   Follow-up with PCP in 1 week Follow-up with Dr. Okey Dupre in 1 week.   Admission Diagnosis  Unstable angina (HCC) [I20.0]   Discharge Diagnosis  Unstable angina (HCC) [I20.0]    Active Problems:   Chest pain   Non-ST elevation (NSTEMI) myocardial infarction [facility])   NSTEMI (non-ST elevated myocardial infarction) [facility])      Past Medical History:  Diagnosis Date  . Diabetes mellitus without complication (HCC)   . GERD (gastroesophageal reflux disease)   . Joint pain   . Murmur   . Stroke [facility]) Sept 2015   TIA    Past Surgical History:  Procedure Laterality Date  . BREAST BIOPSY Bilateral 1988   benign/Dr Evette Cristal  . CESAREAN SECTION  1986  . CORONARY STENT INTERVENTION N/A 07/30/2019   Procedure: CORONARY STENT INTERVENTION;  Surgeon: [name], MD;  Location: [facility] INVASIVE CV LAB;  Service: Cardiovascular;  Laterality: N/A;  RCA  . LEFT HEART CATH AND CORONARY ANGIOGRAPHY N/A 07/30/2019   Procedure: LEFT HEART CATH AND CORONARY ANGIOGRAPHY;  Surgeon: [name], MD;  Location: [facility] INVASIVE CV LAB;  Service: Cardiovascular;  Laterality: N/A;       History of present illness and  Hospital Course:     Kindly see H&P for history of present illness and admission details, please review complete Labs, Consult reports and Test reports for all details in brief  HPI  from the history and physical done on the day of admission    Hospital Course  Chest pain secondary to non-ST elevation MI, patient had emergency cardiac cath which showed 95% RCA  stenosis status post PCI with drug-eluting stent after the cardiac cath patient remained asymptomatic ,without any further chest pain.  Patient needs dual antiplatelet therapy with aspirin, Brilinta for 12 months.  Cardiology is planning to transition to Plavix in future if possible. Patient already is arranged to have outpatient cardiac rehab.  Aggressive secondary prevention with statins, patient is on Crestor but on alternate day as patient has intolerance to statins. 2.  Ischemic cardiomyopathy secondary to first problem, patient ejection fraction is 45 to 50% well compensated.  Continue metoprolol tartrate, losartan #3 diabetes mellitus type 2, continue glipizide, patient is advised to stop metformin for 48 hours secondary to contrast that she received for cardiac cath can resume after 2 days.     Discharge Condition: Stable   Follow UP  Follow-up Information    [facility] Follow up.   Specialty: Cardiac Rehabilitation Why: Your cardiologist has referred you to outpatient Cardiac Rehab at [facility].  The Cardiac Rehab dept will contact you within one to two weeks of discharge to schedule your first appointment.   Contact information: [address] [id number] [address] [place] [address] [phone]       Creig Hines, NP. Schedule an appointment as soon as possible for a visit on 08/14/2019.   Specialties: Nurse Practitioner, Cardiology, Radiology Why: @ 10am Contact information: [address] [place] [address] [phone]        Corky Downs, MD. Schedule an appointment as soon as possible for a visit in 1 week.   Specialty: Cardiology Why: Patient will  need to call and make a follow up appointment. Contact information: [address] [place] [address] [phone]             Discharge Instructions  and  Discharge Medications     Discharge Instructions    AMB Referral to Cardiac  Rehabilitation - Phase II   Complete by: As directed    Diagnosis:  Coronary Stents NSTEMI     After initial evaluation and assessments completed: Virtual Based Care may be provided alone or in conjunction with Phase 2 Cardiac Rehab based on patient barriers.: Yes     Allergies as of 07/31/2019      Reactions   Erythromycin    Stomach Problems'   Levaquin [levofloxacin In D5w]    Penicillins    Heart races   Tequin [gatifloxacin]    Zoloft [sertraline Hcl]    Zithromax [azithromycin] Palpitations   Heart race      Medication List    STOP taking these medications   ibuprofen 400 MG tablet Commonly known as: ADVIL   metFORMIN 1000 MG tablet Commonly known as: GLUCOPHAGE     TAKE these medications   aspirin 81 MG tablet Take 81 mg by mouth daily.   cholecalciferol 1000 units tablet Commonly known as: VITAMIN D Take 1,000 Units by mouth daily.   diphenhydrAMINE 25 MG tablet Commonly known as: BENADRYL Take 25 mg by mouth at bedtime.   glipiZIDE 5 MG 24 hr tablet Commonly known as: GLUCOTROL XL Take 5 mg by mouth daily.   losartan 100 MG tablet Commonly known as: COZAAR Take 100 mg by mouth daily.   metoprolol tartrate 25 MG tablet Commonly known as: LOPRESSOR Take 1 tablet (25 mg total) by mouth 2 (two) times daily.   multivitamin with minerals Tabs tablet Take 1 tablet by mouth daily.   Nasonex 50 MCG/ACT nasal spray Generic drug: mometasone Place 2 sprays into the nose daily.   nystatin 100000 UNIT/ML suspension Commonly known as: MYCOSTATIN Take 5 mLs by mouth as needed.   rosuvastatin 5 MG tablet Commonly known as: CRESTOR Take 1 tablet (5 mg total) by mouth every Monday, Wednesday, and Friday.   ticagrelor 90 MG Tabs tablet Commonly known as: BRILINTA Take 1 tablet (90 mg total) by mouth 2 (two) times daily.   vitamin E 400 UNIT capsule Take 400 Units by mouth daily.         Diet and Activity recommendation: See Discharge Instructions  above   Consults obtained -cardiology   Major procedures and Radiology Reports - PLEASE review detailed and final reports for all details, in brief -      Dg Chest 2 View  Result Date: 07/29/2019 CLINICAL DATA:  Intermittent left chest pain radiating to the mandible for the past 3 days. Shortness of breath. Ex-smoker. EXAM: CHEST - 2 VIEW COMPARISON:  07/23/2014. FINDINGS: Normal sized heart. Stable linear scarring at the left lung base. Otherwise, clear lungs with normal vascularity. Minimal peribronchial thickening without significant change. Mild thoracic spine degenerative changes. IMPRESSION: No acute abnormality. Stable minimal chronic bronchitic changes. Electronically Signed   By: Beckie Salts M.D.   On: 07/29/2019 17:26    Micro Results     Recent Results (from the past 240 hour(s))  SARS Coronavirus 2 [facility] order, Performed in [facility] hospital lab) Nasopharyngeal Nasopharyngeal Swab     Status: None   Collection Time: 07/29/19  6:57 PM   Specimen: Nasopharyngeal Swab  Result Value Ref Range Status  SARS Coronavirus 2 NEGATIVE NEGATIVE Final    Comment: (NOTE) If result is NEGATIVE SARS-CoV-2 target nucleic acids are NOT DETECTED. The SARS-CoV-2 RNA is generally detectable in upper and lower  respiratory specimens during the acute phase of infection. The lowest  concentration of SARS-CoV-2 viral copies this assay can detect is 250  copies / mL. A negative result does not preclude SARS-CoV-2 infection  and should not be used as the sole basis for treatment or other  patient management decisions.  A negative result may occur with  improper specimen collection / handling, submission of specimen other  than nasopharyngeal swab, presence of viral mutation(s) within the  areas targeted by this assay, and inadequate number of viral copies  (<250 copies / mL). A negative result must be combined with clinical  observations, patient history, and epidemiological  information. If result is POSITIVE SARS-CoV-2 target nucleic acids are DETECTED. The SARS-CoV-2 RNA is generally detectable in upper and lower  respiratory specimens dur ing the acute phase of infection.  Positive  results are indicative of active infection with SARS-CoV-2.  Clinical  correlation with patient history and other diagnostic information is  necessary to determine patient infection status.  Positive results do  not rule out bacterial infection or co-infection with other viruses. If result is PRESUMPTIVE POSTIVE SARS-CoV-2 nucleic acids MAY BE PRESENT.   A presumptive positive result was obtained on the submitted specimen  and confirmed on repeat testing.  While 2019 novel coronavirus  (SARS-CoV-2) nucleic acids may be present in the submitted sample  additional confirmatory testing may be necessary for epidemiological  and / or clinical management purposes  to differentiate between  SARS-CoV-2 and other Sarbecovirus currently known to infect humans.  If clinically indicated additional testing with an alternate test  methodology [phone]) is advised. The SARS-CoV-2 RNA is generally  detectable in upper and lower respiratory sp ecimens during the acute  phase of infection. The expected result is Negative. Fact Sheet for Patients:  BoilerBrush.com.cy Fact Sheet for Healthcare Providers: https://pope.com/ This test is not yet approved or cleared by the [place] FDA and has been authorized for detection and/or diagnosis of SARS-CoV-2 by FDA under an Emergency Use Authorization (EUA).  This EUA will remain in effect (meaning this test can be used) for the duration of the COVID-19 declaration under Section 564(b)(1) of the Act, 21 U.S.C. section 360bbb-3(b)(1), unless the authorization is terminated or revoked sooner. Performed at [facility], [address] [place], [place] [address]        Today    Subjective:   Donna Harper today has no headache,no chest abdominal pain,no new weakness tingling or numbness, feels much better wants to go home today.   Objective:   Blood pressure 129/79, pulse 78, temperature 98.2 F (36.8 C), temperature source Oral, resp. rate 17, height 5\' 3"  (1.6 m), weight 68.1 kg, SpO2 93 %.  No intake or output data in the 24 hours ending 08/02/19 1601  Exam Awake Alert, Oriented x 3, No new F.N deficits, Normal affect [place].AT,PERRAL Supple Neck,No JVD, No cervical lymphadenopathy appriciated.  Symmetrical Chest wall movement, Good air movement bilaterally, CTAB RRR,No Gallops,Rubs or new Murmurs, No Parasternal Heave +ve B.Sounds, Abd Soft, Non tender, No organomegaly appriciated, No rebound -guarding or rigidity. No Cyanosis, Clubbing or edema, No new Rash or bruise  Data Review   CBC w Diff:  Lab Results  Component Value Date   WBC 5.9 07/31/2019   HGB 11.2 (L) 07/31/2019  HGB 12.3 07/22/2014   HCT 32.5 (L) 07/31/2019   HCT 37.3 07/22/2014   PLT 166 07/31/2019   PLT 190 07/22/2014    CMP:  Lab Results  Component Value Date   NA 140 07/31/2019   NA 143 07/22/2014   K 3.4 (L) 07/31/2019   K 4.5 07/22/2014   CL 106 07/31/2019   CL 107 07/22/2014   CO2 26 07/31/2019   CO2 26 07/22/2014   BUN 15 07/31/2019   BUN 10 07/22/2014   CREATININE 0.71 07/31/2019   CREATININE 0.73 07/22/2014   PROT 6.9 07/22/2014   ALBUMIN 3.6 07/22/2014   BILITOT 0.2 07/22/2014   ALKPHOS 61 07/22/2014   AST 25 07/22/2014   ALT 26 07/22/2014  .   Total Time in preparing paper work, data evaluation and todays exam - 35 minutes  Katha Hamming M.D on 07/31/2019 at 4:01 PM    Note: This dictation was prepared with Dragon dictation along with smaller phrase technology. Any transcriptional errors that result from this process are unintentional.

## 2019-08-02 NOTE — Telephone Encounter (Signed)
Home number says "Call cannot be completed at this time."  No answer on Mobile number. Left detail message with recommendations that she does not need to take Welchol but remain on the rosuvastatin, ok per DPR, and to call back if any questions.  Attempted to try patient one more time. She answered and verbalized understanding of recommendations.  Welchol removed from med list.

## 2019-08-06 ENCOUNTER — Telehealth: Payer: Self-pay | Admitting: Internal Medicine

## 2019-08-06 DIAGNOSIS — E7800 Pure hypercholesterolemia, unspecified: Secondary | ICD-10-CM

## 2019-08-06 NOTE — Telephone Encounter (Signed)
Pt c/o medication issue:  1. Name of Medication: Crestor  2. How are you currently taking this medication (dosage and times per day)? 5 mg daily Monday, Wednesday, Friday   3. Are you having a reaction (difficulty breathing--STAT)? Leg cramping, Barely walk 4. What is your medication issue? Not tolerating well

## 2019-08-06 NOTE — Telephone Encounter (Signed)
Called patient and she verbalized understanding to stop the rosuvastatin. She is agreeable to the referral to the Lipid clinic and is aware someone will call her for an appointment. Referral entered.

## 2019-08-06 NOTE — Telephone Encounter (Signed)
Given intolerance of multiple statins, including low-dose rosuvastatin, please have her stop rosuvastatin and refer her to the lipid clinic to discuss PCSK9 inhibitor vs bempedoic acid therapy.  Thanks.  Nelva Bush, MD [facility] Pager: [phone]

## 2019-08-06 NOTE — Telephone Encounter (Signed)
Received incoming call from patient.  Post cath was started on Crestor 5 mg M-W-F.  Started on 07/31/19 and since then has experienced horrible leg cramping and pain. The pain is keeping her up at night and she cannot sleep. States this has happened in the past with other statins through her PCP. She is going to PCP today and I advised her to get the names and dates of the statins she has tried as this may help with prior authorizations for other options that may be prescribed. Patient has f/u with Ignacia Bayley, NP next week on 9/2. Advised I will make Dr End aware and let her know about any further advice.

## 2019-08-14 ENCOUNTER — Ambulatory Visit (INDEPENDENT_AMBULATORY_CARE_PROVIDER_SITE_OTHER): Payer: Medicare Other | Admitting: Nurse Practitioner

## 2019-08-14 ENCOUNTER — Encounter: Payer: Self-pay | Admitting: Nurse Practitioner

## 2019-08-14 ENCOUNTER — Other Ambulatory Visit: Payer: Self-pay

## 2019-08-14 ENCOUNTER — Telehealth: Payer: Self-pay

## 2019-08-14 VITALS — BP 124/62 | HR 72 | Ht 63.0 in | Wt 149.0 lb

## 2019-08-14 DIAGNOSIS — E785 Hyperlipidemia, unspecified: Secondary | ICD-10-CM

## 2019-08-14 DIAGNOSIS — I214 Non-ST elevation (NSTEMI) myocardial infarction: Secondary | ICD-10-CM

## 2019-08-14 DIAGNOSIS — I10 Essential (primary) hypertension: Secondary | ICD-10-CM | POA: Diagnosis not present

## 2019-08-14 DIAGNOSIS — I200 Unstable angina: Secondary | ICD-10-CM | POA: Diagnosis not present

## 2019-08-14 DIAGNOSIS — I2510 Atherosclerotic heart disease of native coronary artery without angina pectoris: Secondary | ICD-10-CM

## 2019-08-14 DIAGNOSIS — E119 Type 2 diabetes mellitus without complications: Secondary | ICD-10-CM

## 2019-08-14 MED ORDER — TICAGRELOR 90 MG PO TABS: 90.0000 mg | ORAL_TABLET | Freq: Two times a day (BID) | ORAL | 11 refills | Status: DC

## 2019-08-14 MED ORDER — REPATHA SURECLICK 140 MG/ML ~~LOC~~ SOAJ: 1.0000 "application " | SUBCUTANEOUS | 11 refills | Status: DC

## 2019-08-14 MED ORDER — METOPROLOL TARTRATE 25 MG PO TABS: 25.0000 mg | ORAL_TABLET | Freq: Two times a day (BID) | ORAL | 11 refills | Status: DC

## 2019-08-14 MED ORDER — NITROGLYCERIN 0.4 MG SL SUBL: 0.4000 mg | SUBLINGUAL_TABLET | SUBLINGUAL | 3 refills | Status: AC | PRN

## 2019-08-14 NOTE — Telephone Encounter (Signed)
Please place an order for a new Rx for Praluent with correct dose and instructions since the Repatha is not the covered formulary for the patients insurance plan.

## 2019-08-14 NOTE — Patient Instructions (Signed)
Medication Instructions:  1- START Repatha Inject 1 application into the skin every 14 (fourteen) days. 2- As needed Take NTG Place 1 tablet (0.4 mg total) under the tongue every 5 (five) minutes as needed for chest pain. Max 3 tablets. If chest pain is not relieved after 3 tablets seek urgent medical attention.   If you need a refill on your cardiac medications before your next appointment, please call your pharmacy.   Lab work: None ordered  If you have labs (blood work) drawn today and your tests are completely normal, you will receive your results only by: [name] MyChart Message (if you have MyChart) OR . A paper copy in the mail If you have any lab test that is abnormal or we need to change your treatment, we will call you to review the results.  Testing/Procedures: None ordered   Follow-Up: At [facility], you and your health needs are our priority.  As part of our continuing mission to provide you with exceptional heart care, we have created designated Provider Care Teams.  These Care Teams include your primary Cardiologist (physician) and Advanced Practice Providers (APPs -  Physician Assistants and Nurse Practitioners) who all work together to provide you with the care you need, when you need it. You will need a follow up appointment in 1 months.  You may see Nelva Bush, MD or Murray Hodgkins, NP.

## 2019-08-14 NOTE — Telephone Encounter (Signed)
Acknowledge: Repatha is not covered by the plan. In order for Repatha to be covered, you are required to have tried and failed all alternatives or confirm that you believe the alternatives would have serious adverse side effects and/or be ineffective for the patient. Praluent is a covered formulary alternative that is Transport planner (FDA) labeled for similar indications as Repatha and must be considered prior to obtaining an exception. By checking the "Yes" box, you are acknowledging the requirement process to obtain an approval exception.  Per Donna Bayley, NP the patient can try the Praluent first. A new Rx will be sent to the patients pharmacy.

## 2019-08-14 NOTE — Progress Notes (Signed)
Office Visit    Patient Name: Donna Harper Date of Encounter: 08/14/2019  Primary Care Provider:  Cletis Athens, MD Primary Cardiologist:  Nelva Bush, MD  Chief Complaint    68 year old female with a history of diabetes, hyperlipidemia, TIA, and GERD, who was recently admitted with chest pain and ruled in for non-STEMI and is now status post PCI and drug-eluting stent placement to the RCA.  Past Medical History    Past Medical History:  Diagnosis Date   CAD (coronary artery disease)    a. 07/2019 NSTEMI/PCI: LM 15d, LAD 40p, 50/16m, RI nl, LCX nl, OM1 small, RCA large, 30p, 48m (3.0x12 Resolute Onyx DES), RPDA fills via L->R collats (OM1), EF 45-50%.   GERD (gastroesophageal reflux disease)    Hyperlipidemia LDL goal <70    Ischemic cardiomyopathy    a. 07/30/2019 LV gram: EF 45-50%, basal inf HK; b. 07/31/2019 Echo: EF 55-60%, impaired relaxation, sev basal inf HK. Nl RV fxn.    Joint pain    Murmur    a. 07/2019 Echo: no significant valvular abnormalities. Mild AoV thickening.   TIA (transient ischemic attack) 08/2014   Type II diabetes mellitus ([place])    Past Surgical History:  Procedure Laterality Date   BREAST BIOPSY Bilateral 1988   benign/Dr [place]   CORONARY STENT INTERVENTION N/A 07/30/2019   Procedure: CORONARY STENT INTERVENTION;  Surgeon: Nelva Bush, MD;  Location: [place] [facility];  Service: Cardiovascular;  Laterality: N/A;  RCA   LEFT HEART CATH AND CORONARY ANGIOGRAPHY N/A 07/30/2019   Procedure: LEFT HEART CATH AND CORONARY ANGIOGRAPHY;  Surgeon: Nelva Bush, MD;  Location: [place] [facility];  Service: Cardiovascular;  Laterality: N/A;    Allergies  Allergies  Allergen Reactions   Atorvastatin     Myalgias   Erythromycin     Stomach Problems'    Levaquin [Levofloxacin In D5w]    Penicillins     Heart races   Rosuvastatin     Myalgias   Simvastatin     Myalgias   Tequin  [Gatifloxacin]    Zoloft [Sertraline Hcl]    Zithromax [Azithromycin] Palpitations    Heart race    History of Present Illness    68 year old female with the above past medical history including GERD, hyperlipidemia with multiple statin intolerances, diabetes, and TIA.  She was recently admitted to [facility] with a one-week history of exertional chest pressure and radiation to her left arm.  She ruled in for non-STEMI with a peak high-sensitivity troponin of 593.  She underwent diagnostic catheterization which showed moderate nonobstructive LAD disease and a 95% stenosis in the mid right coronary artery.  The RCA was successfully treated with a drug-eluting stent.  EF by ventriculography was 45 to 50% however, echocardiogram performed the following day showed an EF of 55 to 60% with impaired relaxation and severe basal inferior hypokinesis.  She did well post procedure and was maintained on aspirin, low-dose rosuvastatin (prescribed Monday, Wednesday, Friday), beta-blocker, ARB, and ticagrelor therapy.  Since discharge she has done well.  Unfortunately, she did develop significant bilateral lower extremity myalgias simvastatin and she discontinued this.  She had a lot of bruising involving her right forearm, but this has improved significantly.  She denies chest pain, palpitations, pnd, orthopnea, n, v, dizziness, syncope, edema, weight gain, or early satiety.  She has noted occasional sigh breathing, which she attributes to brilinta.  Her husband has MS and she is  responsible for most of the chores around the house.  She is eager to get back to those.  She has been  Contacted by cardiac rehab and is looking forward to enrolling.  Home Medications    Prior to Admission medications   Medication Sig Start Date End Date Taking? Authorizing Provider  aspirin 81 MG tablet Take 81 mg by mouth daily.    [provider]  cholecalciferol (VITAMIN D) 1000 UNITS tablet Take 1,000 Units by  mouth daily.    [provider]  diphenhydrAMINE (BENADRYL) 25 MG tablet Take 25 mg by mouth at bedtime.    [provider]  glipiZIDE (GLUCOTROL XL) 5 MG 24 hr tablet Take 5 mg by mouth daily. 07/23/19   [provider]  losartan (COZAAR) 100 MG tablet Take 100 mg by mouth daily. 05/20/19   [provider]  metoprolol tartrate (LOPRESSOR) 25 MG tablet Take 1 tablet (25 mg total) by mouth 2 (two) times daily. 07/31/19   Epifanio Lesches, MD  Multiple Vitamin (MULTIVITAMIN WITH MINERALS) TABS tablet Take 1 tablet by mouth daily.    [provider]  NASONEX 50 MCG/ACT nasal spray Place 2 sprays into the nose daily.  10/29/14   [provider]  nystatin (MYCOSTATIN) 100000 UNIT/ML suspension Take 5 mLs by mouth as needed.    [provider]  ticagrelor (BRILINTA) 90 MG TABS tablet Take 1 tablet (90 mg total) by mouth 2 (two) times daily. 07/31/19   Epifanio Lesches, MD  vitamin E 400 UNIT capsule Take 400 Units by mouth daily.    [provider]    Review of Systems    Occasional vague episodes of dyspnea/sigh breathing (similar to what is commonly reported w/ brilinta).  Bruising to right wrist/forearm, which is improving.  She denies chest pain, palpitations, pnd, orthopnea, n, v, dizziness, syncope, edema, weight gain, or early satiety. All other systems reviewed and are otherwise negative except as noted above.  Physical Exam    VS:  BP 124/62 (BP Location: Left Arm, Patient Position: Sitting, Cuff Size: Normal)    Pulse 72    Ht 5\' 3"  (1.6 m)    Wt 149 lb (67.6 kg)    SpO2 98%    BMI 26.39 kg/m  , BMI Body mass index is 26.39 kg/m. GEN: Well nourished, well developed, in no acute distress. HEENT: normal. Neck: Supple, no JVD, carotid bruits, or masses. Cardiac: RRR, 2/6 syst ejection murmur @ the RUSB, no rubs, or gallops. No clubbing, cyanosis, edema.  Radials/DP/PT 2+ and equal bilaterally.  R wrist/forearm, mild,  resolving ecchymosis.  No bruits/bleeding. Respiratory:  Respirations regular and unlabored, clear to auscultation bilaterally. GI: Soft, nontender, nondistended, BS + x 4. MS: no deformity or atrophy. Skin: warm and dry, no rash. Neuro:  Strength and sensation are intact. Psych: Normal affect.  Accessory Clinical Findings    ECG personally reviewed by me today - RSR, 71, inf TWI  - no acute changes.  Lab Results  Component Value Date   WBC 5.9 07/31/2019   HGB 11.2 (L) 07/31/2019   HCT 32.5 (L) 07/31/2019   MCV 87.6 07/31/2019   PLT 166 07/31/2019   Lab Results  Component Value Date   CREATININE 0.71 07/31/2019   BUN 15 07/31/2019   NA 140 07/31/2019   K 3.4 (L) 07/31/2019   CL 106 07/31/2019   CO2 26 07/31/2019   Lab Results  Component Value Date   ALT 26 07/22/2014  AST 25 07/22/2014   ALKPHOS 61 07/22/2014   BILITOT 0.2 07/22/2014   Lab Results  Component Value Date   CHOL 264 (H) 07/29/2019   HDL 47 07/29/2019   LDLCALC 143 (H) 07/29/2019   TRIG 372 (H) 07/29/2019   CHOLHDL 5.6 07/29/2019     Assessment & Plan    1.  NSTEMI, subsequent episode of care/CAD:  S/p recent admission w/ chest pain and NSTEMI.  Cath w/ severe mid RCA dzs, which was successfully treated w/ DES.  She has residual 65% stenosis in the mLAD. She has not been having chest pain and we discussed the role of medical therapy for nonobs CAD today.  She chest pain.  She has noted an occasional headache and fleeting episodes of dyspnea and sigh breathing which is likely secondary to Brilinta.  She has not had any dyspnea on exertion.  Brilinta symptoms are tolerable at this time and she notes that they have improved over the past 2 weeks.  She remains on aspirin, Brilinta, beta-blocker, and ARB.  Unfortunately, she was not able to tolerate low-dose rosuvastatin and has previously been intolerant to simvastatin and atorvastatin, with all statins thus far causing myalgias.  Her LDL was 143.  We  discussed potentially using Zetia, which she does not think she is tried before, however this would not change a goal LDL of less than 70.  In that setting, I am going to prescribe Repatha 140 mg subcu every 2 weeks.  Provided that she can get started on this, we will plan to follow-up lipids and about 6 weeks.  She also has hypertriglyceridemia, would have a low threshold to add vascepa.  She has been contacted by cardiac rehab and plans to participate.  2.  Essential hypertension: Stable on beta-blocker and ARB therapy.  3.  Hyperlipidemia/hypertriglyceridemia: See #1.  Intolerant to multiple statins in the setting of myalgias, now including low-dose rosuvastatin.  We considered adding Zetia however, this will not achieve an LDL goal of less than 70 and she is starting at 143.  I have prescribed Repatha 140 mg subcutaneously every 2 weeks and we can look to consider Vascepa in the future if triglycerides remain greater than 150.  4.  Type 2 diabetes mellitus: A1c was 6.5 during her hospitalization.  This is managed by primary care she remains on metformin, glipizide, and ARB.  Intolerant to statins as outlined above.  5.  Ischemic cardiomyopathy: EF 45 to 50% on ventriculography and 55 to 60% by echo w/ severe basal inf HK.  Euvolemic on exam.  Cont  blocker and ARB.  6.  Dispo: f/u in 1 month.  Plan to f/u lipids about 6 wks after starting Repatha.   Murray Hodgkins, NP 08/14/2019, 11:31 AM

## 2019-08-14 NOTE — Telephone Encounter (Signed)
Prior Authorization sent for [employer]  (Key: [id number]) Rx #: [id number] Repatha SureClick 140MG /ML auto-injectors

## 2019-08-15 ENCOUNTER — Encounter: Payer: Self-pay | Admitting: Internal Medicine

## 2019-08-15 ENCOUNTER — Telehealth: Payer: Self-pay | Admitting: Internal Medicine

## 2019-08-15 ENCOUNTER — Telehealth: Payer: Self-pay

## 2019-08-15 ENCOUNTER — Telehealth (INDEPENDENT_AMBULATORY_CARE_PROVIDER_SITE_OTHER): Payer: Medicare Other | Admitting: Internal Medicine

## 2019-08-15 DIAGNOSIS — E782 Mixed hyperlipidemia: Secondary | ICD-10-CM | POA: Diagnosis not present

## 2019-08-15 DIAGNOSIS — I200 Unstable angina: Secondary | ICD-10-CM

## 2019-08-15 DIAGNOSIS — I214 Non-ST elevation (NSTEMI) myocardial infarction: Secondary | ICD-10-CM | POA: Diagnosis not present

## 2019-08-15 DIAGNOSIS — Z789 Other specified health status: Secondary | ICD-10-CM

## 2019-08-15 MED ORDER — PRALUENT 150 MG/ML ~~LOC~~ SOAJ: 150.0000 mg | SUBCUTANEOUS | 11 refills | Status: DC

## 2019-08-15 NOTE — Telephone Encounter (Signed)
Patient aware Praluent is approved. [facility] is presently out of stock but should call her when available and they will run on her insurance. She is to call if copay not affordable and look into the healthwellfoundation patient assistance

## 2019-08-15 NOTE — Progress Notes (Signed)
Virtual Visit via Telephone Note   This visit type was conducted due to national recommendations for restrictions regarding the COVID-19 Pandemic (e.g. social distancing) in an effort to limit this patient's exposure and mitigate transmission in our community.  Due to her co-morbid illnesses, this patient is at least at moderate risk for complications without adequate follow up.  This format is felt to be most appropriate for this patient at this time.  The patient did not have access to video technology/had technical difficulties with video requiring transitioning to audio format only (telephone).  All issues noted in this document were discussed and addressed.  No physical exam could be performed with this format.  Please refer to the patient's chart for her  consent to telehealth for [facility].   Evaluation Performed: Telephone visit  Date:  08/15/2019   ID:  Donna Harper, DOB 1951/08/08, MRN [id number]  Patient Location:  [address] [name] [facility] [address]  Provider location:   [address] [place], [place] [address]  PCP:  Corky Downs, MD  Cardiologist:  [name], MD Electrophysiologist:  None   Chief Complaint: Manage dyslipidemia  History of Present Illness:    Donna Harper is a 68 y.o. female who presents via audio/video conferencing for a telehealth visit today.  This is a pleasant 68 year old female kindly referred by Dr. and for management of dyslipidemia.  She was found to have coronary artery disease during recent cardiac catheterization which showed an EF of 45 to 50% by LV gram however was 55 to 60% by echo.  She was found to have multivessel coronary disease and received a stent to the mid RCA.  Aggressive medical therapy was recommended.  Recently labs show total cholesterol 264, triglycerides 372, HDL 47 and LDL 143.  Unfortunately she has a history of statin intolerance causing joint pain and myalgias.  She has been intolerant to  atorvastatin, rosuvastatin, pravastatin and others.  She was seen in follow-up yesterday by Christain Sacramento, NP, who recommended starting Repatha.  This was submitted however she was notified by pharmacy that the cost would be about $500/month.  Subsequently it was noted that Praluent is the preferred agent for her insurance and a new prescription was sent in today.  We have discussed options with her if the cost remains elevated including options for patient assistance was we provided her for.  She will need to fill out those applications and submit it.  Cost may also be affected by the insurance plan, deductible, whether she is in the donut hole, etc.  She may wish to consider an alternative healthcare plan during open enrollment this fall.  If she were to be unable to take a PCSK9 inhibitor, there are other options which we can consider and I would like to continue to follow her lipid clinic for this.  The patient does not have symptoms concerning for COVID-19 infection (fever, chills, cough, or new SHORTNESS OF BREATH).    Prior CV studies:   The following studies were reviewed today:  Chart reviewed Lab work  PMHx:  Past Medical History:  Diagnosis Date   CAD (coronary artery disease)    a. 07/2019 NSTEMI/PCI: LM 15d, LAD 40p, 50/61m, RI nl, LCX nl, OM1 small, RCA large, 30p, 85m (3.0x12 Resolute Onyx DES), RPDA fills via L->R collats (OM1), EF 45-50%.   GERD (gastroesophageal reflux disease)    Hyperlipidemia LDL goal <70    Ischemic cardiomyopathy    a. 07/30/2019 LV gram:  EF 45-50%, basal inf HK; b. 07/31/2019 Echo: EF 55-60%, impaired relaxation, sev basal inf HK. Nl RV fxn.    Joint pain    Murmur    a. 07/2019 Echo: no significant valvular abnormalities. Mild AoV thickening.   TIA (transient ischemic attack) 08/2014   Type II diabetes mellitus ([facility])     Past Surgical History:  Procedure Laterality Date   BREAST BIOPSY Bilateral 1988   benign/Dr [place]   CESAREAN SECTION   1986   CORONARY STENT INTERVENTION N/A 07/30/2019   Procedure: CORONARY STENT INTERVENTION;  Surgeon: [name], MD;  Location: [facility];  Service: Cardiovascular;  Laterality: N/A;  RCA   LEFT HEART CATH AND CORONARY ANGIOGRAPHY N/A 07/30/2019   Procedure: LEFT HEART CATH AND CORONARY ANGIOGRAPHY;  Surgeon: [name], MD;  Location: [facility];  Service: Cardiovascular;  Laterality: N/A;    FAMHx:  Family History  Problem Relation Age of Onset   COPD Mother    COPD Father     SOCHx:   reports that she has quit smoking. She has never used smokeless tobacco. She reports that she does not drink alcohol or use drugs.  ALLERGIES:  Allergies  Allergen Reactions   Atorvastatin     Myalgias   Erythromycin     Stomach Problems'    Levaquin [Levofloxacin In D5w]    Penicillins     Heart races   Rosuvastatin     Myalgias   Simvastatin     Myalgias   Tequin [Gatifloxacin]    Zoloft [Sertraline Hcl]    Zithromax [Azithromycin] Palpitations    Heart race    MEDS:  Current Meds  Medication Sig   Alirocumab (PRALUENT) 150 MG/ML SOAJ Inject 150 mg into the skin every 14 (fourteen) days.   aspirin 81 MG tablet Take 81 mg by mouth daily.   cholecalciferol (VITAMIN D) 1000 UNITS tablet Take 1,000 Units by mouth daily.   diphenhydrAMINE (BENADRYL) 25 MG tablet Take 25 mg by mouth at bedtime.   glipiZIDE (GLUCOTROL XL) 5 MG 24 hr tablet Take 5 mg by mouth daily.   losartan (COZAAR) 100 MG tablet Take 100 mg by mouth daily.   metoprolol tartrate (LOPRESSOR) 25 MG tablet Take 1 tablet (25 mg total) by mouth 2 (two) times daily.   Multiple Vitamin (MULTIVITAMIN WITH MINERALS) TABS tablet Take 1 tablet by mouth daily.   NASONEX 50 MCG/ACT nasal spray Place 2 sprays into the nose daily.    nitroGLYCERIN (NITROSTAT) 0.4 MG SL tablet Place 1 tablet (0.4 mg total) under the tongue every 5 (five) minutes as needed for chest pain.    nystatin (MYCOSTATIN) 100000 UNIT/ML suspension Take 5 mLs by mouth as needed.   ticagrelor (BRILINTA) 90 MG TABS tablet Take 1 tablet (90 mg total) by mouth 2 (two) times daily.   vitamin E 400 UNIT capsule Take 400 Units by mouth daily.     ROS: Pertinent items noted in HPI and remainder of comprehensive ROS otherwise negative.  Labs/Other Tests and Data Reviewed:    Recent Labs: 07/31/2019: BUN 15; Creatinine, Ser 0.71; Hemoglobin 11.2; Platelets 166; Potassium 3.4; Sodium 140   Recent Lipid Panel Lab Results  Component Value Date/Time   CHOL 264 (H) 07/29/2019 10:48 PM   CHOL 258 (H) 07/23/2014 04:54 AM   TRIG 372 (H) 07/29/2019 10:48 PM   TRIG 640 (H) 07/23/2014 04:54 AM   HDL 47 07/29/2019 10:48 PM   HDL 33 (L) 07/23/2014 04:54  AM   CHOLHDL 5.6 07/29/2019 10:48 PM   LDLCALC 143 (H) 07/29/2019 10:48 PM   LDLCALC SEE COMMENT 07/23/2014 04:54 AM    Wt Readings from Last 3 Encounters:  08/15/19 149 lb (67.6 kg)  08/14/19 149 lb (67.6 kg)  07/31/19 150 lb 1.6 oz (68.1 kg)     Exam:    Vital Signs:  BP 124/62    Pulse 72    Ht 5\' 3"  (1.6 m)    Wt 149 lb (67.6 kg)    BMI 26.39 kg/m    Exam not performed due to telephone visit  ASSESSMENT & PLAN:    1. Mixed dyslipidemia 2. Multivessel coronary artery disease with recent PCI (07/2019) 3. Statin intolerance  Ms. Harbor is a good candidate for PCSK9 inhibitor therapy.  Praluent is preferred with her insurance however may still be cost prohibitive.  We have advised her on medication assistance options and will follow-up on her case in the lipid clinic.  I can provide her with alternatives if she is not able to afford that which may include non-statin medications and certainly we could consider Vascepa as she is a good candidate for that given her elevated triglycerides.  I would like to get her LDL to target less than 70 if possible first.  Thanks again for the kind referral.  Follow-up with me in 3 to 4 months with a lipid  profile.  COVID-19 Education: The signs and symptoms of COVID-19 were discussed with the patient and how to seek care for testing (follow up with PCP or arrange E-visit).  The importance of social distancing was discussed today.  Patient Risk:   After full review of this patients clinical status, I feel that they are at least moderate risk at this time.  Time:   Today, I have spent 25 minutes with the patient with telehealth technology discussing dyslipidemia, PCSK9 inhibitors, coronary artery disease, statin intolerance.     Medication Adjustments/Labs and Tests Ordered: Current medicines are reviewed at length with the patient today.  Concerns regarding medicines are outlined above.   Tests Ordered: No orders of the defined types were placed in this encounter.   Medication Changes: No orders of the defined types were placed in this encounter.   Disposition:  in 3 month(s)  Chrystie Nose, MD, [facility], FACP  [place]   [facility]  Medical Director of the Advanced Lipid Disorders &  Cardiovascular Risk Reduction Clinic Diplomate of the American Board of Clinical Lipidology Attending Cardiologist  Direct Dial: [phone]   Fax: [phone]  Website:  [email or web address]  Chrystie Nose, MD  08/15/2019 11:05 AM

## 2019-08-15 NOTE — Telephone Encounter (Signed)
Patient states her copay for Brilinta is >$300. Unsure if prior authorization is needed. Advised again on [place] for assistance

## 2019-08-15 NOTE — Telephone Encounter (Signed)
PRALUENT Soln Auto-inj Type of coverage approved: Prior Authorization This approval authorizes your coverage from 05/17/2019 - 08/14/2020

## 2019-08-15 NOTE — Telephone Encounter (Signed)
Spoke with patient during phone call prior to lipid clinic telemedicine visit  Provided her with phone # for [employer] - Brilinta patient assistance

## 2019-08-15 NOTE — Addendum Note (Signed)
Addended by: Rogelia Mire on: 08/15/2019 07:52 AM   Modules accepted: Orders

## 2019-08-15 NOTE — Patient Instructions (Signed)
Medication Instructions:  START Praluent 150mg /mL - self-administered once every 14 days -- this will need prior authorization with your insurance -- [email or web address] >> disease funds >> hypercholesterolemia  If you need a refill on your cardiac medications before your next appointment, please call your pharmacy.   Lab work: FASTING lab work to be completed prior to next visit with MD - lab order will be mailed If you have labs (blood work) drawn today and your tests are completely normal, you will receive your results only by: [name] MyChart Message (if you have MyChart) OR . A paper copy in the mail If you have any lab test that is abnormal or we need to change your treatment, we will call you to review the results.  Follow-Up: Dr. Debara Pickett recommends that you schedule a follow up visit with him the in the [place] in 3-4 months. Please have fasting blood work about 1 week prior to this visit and he will review the blood work results with you at your appointment.

## 2019-08-15 NOTE — Telephone Encounter (Signed)
PA for Praluent 150mg /mL q2 weeks submitted via [email or web address] ([employer]ark)  [id number] [id number] PCN: [id number] RxGrp: [id number]

## 2019-08-15 NOTE — Telephone Encounter (Signed)
Patient is concerned about having to pay $300.00 a month  for Brilinta. She would like to know if there is another medication she can take that is cheaper. Please contact patient and advise.

## 2019-08-15 NOTE — Telephone Encounter (Signed)
Spoke with patient about Praluent. She is OK with change from Repatha to this PCSK9 d/t insurance coverage. Provided her with info on the [email or web address] co-pay assistance. She is aware she has to apply online for this. She reports her co-pay was >$500. Advised this may have been so, as the pharmacy did not yet have an approval on file for the medication and her co-pay should be less once covered by insurance. She would like a call back once the PA for Praluent has been approved so she can determine if she can afford.

## 2019-08-15 NOTE — Telephone Encounter (Signed)
Left a message on voice mail to contact our office regarding a change from [place] to Praluent due to insurance drug coverage.

## 2019-08-16 NOTE — Telephone Encounter (Signed)
We should look to see if prior authorization is needed or if medication assistance is an option. If not, we can switch her to clopidogrel. If she is about to run out of her current supply of ticagrelor, she should let us know so that we can come up with a plan to make sure that she doesn't miss any doses.

## 2019-08-20 NOTE — Telephone Encounter (Signed)
Patient is returning your call regarding copay for Brilinta, is $38.61. please call to discuss.

## 2019-08-20 NOTE — Telephone Encounter (Signed)
Called patient and she said Brilinta will cost $36.41 after the deductible. Patient said this was reasonable for her. She also asked what she should do regarding the Cardiac Rehab. She had received a call a few days ago but was not ready to start at that time. Advised patient to call rehab back to start the process. She verbalized understanding.

## 2019-08-20 NOTE — Telephone Encounter (Signed)
Spoke with Pharmacy to gather more information. Pharmacist said patient would need to pay $347 for her Brilinta this time. She has to meet the $365 deductible to see what the price would be after meeting the deductible. Patient needs to check with insurance to get price.  Called patient to discuss options. 1- Call insurance to check on price after she meets deductible. 2- Call Brilinta Patient Assistance to see if she qualifies for the program.  She verbalized understanding of these instructions. She will call these places and call me back with an update. She has 4-5 days of Brilinta left at this time.

## 2019-08-26 ENCOUNTER — Encounter: Payer: Medicare Other | Attending: Internal Medicine | Admitting: *Deleted

## 2019-08-26 ENCOUNTER — Other Ambulatory Visit: Payer: Self-pay

## 2019-08-26 ENCOUNTER — Encounter: Payer: Self-pay | Admitting: *Deleted

## 2019-08-26 DIAGNOSIS — E119 Type 2 diabetes mellitus without complications: Secondary | ICD-10-CM | POA: Insufficient documentation

## 2019-08-26 DIAGNOSIS — Z8673 Personal history of transient ischemic attack (TIA), and cerebral infarction without residual deficits: Secondary | ICD-10-CM | POA: Insufficient documentation

## 2019-08-26 DIAGNOSIS — I2510 Atherosclerotic heart disease of native coronary artery without angina pectoris: Secondary | ICD-10-CM | POA: Insufficient documentation

## 2019-08-26 DIAGNOSIS — I214 Non-ST elevation (NSTEMI) myocardial infarction: Secondary | ICD-10-CM

## 2019-08-26 DIAGNOSIS — Z87891 Personal history of nicotine dependence: Secondary | ICD-10-CM | POA: Insufficient documentation

## 2019-08-26 DIAGNOSIS — Z7982 Long term (current) use of aspirin: Secondary | ICD-10-CM | POA: Insufficient documentation

## 2019-08-26 DIAGNOSIS — I255 Ischemic cardiomyopathy: Secondary | ICD-10-CM | POA: Insufficient documentation

## 2019-08-26 DIAGNOSIS — Z955 Presence of coronary angioplasty implant and graft: Secondary | ICD-10-CM

## 2019-08-26 DIAGNOSIS — Z7984 Long term (current) use of oral hypoglycemic drugs: Secondary | ICD-10-CM | POA: Insufficient documentation

## 2019-08-26 DIAGNOSIS — E785 Hyperlipidemia, unspecified: Secondary | ICD-10-CM | POA: Insufficient documentation

## 2019-08-26 DIAGNOSIS — Z79899 Other long term (current) drug therapy: Secondary | ICD-10-CM | POA: Insufficient documentation

## 2019-08-26 NOTE — Progress Notes (Signed)
Virtual Orientation completed today. Has appt tomorrow with RP/RD for evals  and for gym orientation. Documentation of diagnosis can be found in [facility] 8/17.

## 2019-08-27 VITALS — Ht 63.25 in | Wt 151.7 lb

## 2019-08-27 DIAGNOSIS — E119 Type 2 diabetes mellitus without complications: Secondary | ICD-10-CM | POA: Diagnosis not present

## 2019-08-27 DIAGNOSIS — Z87891 Personal history of nicotine dependence: Secondary | ICD-10-CM | POA: Diagnosis not present

## 2019-08-27 DIAGNOSIS — Z7982 Long term (current) use of aspirin: Secondary | ICD-10-CM | POA: Diagnosis not present

## 2019-08-27 DIAGNOSIS — Z7984 Long term (current) use of oral hypoglycemic drugs: Secondary | ICD-10-CM | POA: Diagnosis not present

## 2019-08-27 DIAGNOSIS — Z79899 Other long term (current) drug therapy: Secondary | ICD-10-CM | POA: Diagnosis not present

## 2019-08-27 DIAGNOSIS — I2510 Atherosclerotic heart disease of native coronary artery without angina pectoris: Secondary | ICD-10-CM | POA: Diagnosis not present

## 2019-08-27 DIAGNOSIS — Z955 Presence of coronary angioplasty implant and graft: Secondary | ICD-10-CM | POA: Diagnosis not present

## 2019-08-27 DIAGNOSIS — I255 Ischemic cardiomyopathy: Secondary | ICD-10-CM | POA: Diagnosis not present

## 2019-08-27 DIAGNOSIS — I214 Non-ST elevation (NSTEMI) myocardial infarction: Secondary | ICD-10-CM

## 2019-08-27 DIAGNOSIS — E785 Hyperlipidemia, unspecified: Secondary | ICD-10-CM | POA: Diagnosis not present

## 2019-08-27 DIAGNOSIS — Z8673 Personal history of transient ischemic attack (TIA), and cerebral infarction without residual deficits: Secondary | ICD-10-CM | POA: Diagnosis not present

## 2019-08-27 NOTE — Progress Notes (Signed)
Cardiac Individual Treatment Plan  Patient Details  Name: Donna Harper MRN: [id number] Date of Birth: November 26, 1951 Referring Provider:     Cardiac Rehab from 08/27/2019 in [facility] Cardiac and Pulmonary Rehab  Referring Provider  End      Initial Encounter Date:    Cardiac Rehab from 08/27/2019 in [facility] Cardiac and Pulmonary Rehab  Date  08/27/19      Visit Diagnosis: NSTEMI (non-ST elevation myocardial infarction) [facility])  Status post coronary artery stent placement  Patient's Home Medications on Admission:  Current Outpatient Medications:  .  Alirocumab (PRALUENT) 150 MG/ML SOAJ, Inject 150 mg into the skin every 14 (fourteen) days., Disp: 2 pen, Rfl: 11 .  aspirin 81 MG tablet, Take 81 mg by mouth daily., Disp: , Rfl:  .  cholecalciferol (VITAMIN D) 1000 UNITS tablet, Take 1,000 Units by mouth daily., Disp: , Rfl:  .  diphenhydrAMINE (BENADRYL) 25 MG tablet, Take 25 mg by mouth at bedtime., Disp: , Rfl:  .  glipiZIDE (GLUCOTROL XL) 5 MG 24 hr tablet, Take 5 mg by mouth daily., Disp: , Rfl:  .  losartan (COZAAR) 100 MG tablet, Take 100 mg by mouth daily., Disp: , Rfl:  .  metFORMIN (GLUCOPHAGE) 1000 MG tablet, Take 1,000 mg by mouth 2 (two) times daily with a meal., Disp: , Rfl:  .  metoprolol tartrate (LOPRESSOR) 25 MG tablet, Take 1 tablet (25 mg total) by mouth 2 (two) times daily., Disp: 60 tablet, Rfl: 11 .  Multiple Vitamin (MULTIVITAMIN WITH MINERALS) TABS tablet, Take 1 tablet by mouth daily., Disp: , Rfl:  .  NASONEX 50 MCG/ACT nasal spray, Place 2 sprays into the nose daily. , Disp: , Rfl: 0 .  nitroGLYCERIN (NITROSTAT) 0.4 MG SL tablet, Place 1 tablet (0.4 mg total) under the tongue every 5 (five) minutes as needed for chest pain., Disp: 90 tablet, Rfl: 3 .  nystatin (MYCOSTATIN) 100000 UNIT/ML suspension, Take 5 mLs by mouth as needed., Disp: , Rfl:  .  ticagrelor (BRILINTA) 90 MG TABS tablet, Take 1 tablet (90 mg total) by mouth 2 (two) times daily., Disp: 60 tablet, Rfl:  11 .  vitamin E 400 UNIT capsule, Take 400 Units by mouth daily., Disp: , Rfl:   Past Medical History: Past Medical History:  Diagnosis Date  . CAD (coronary artery disease)    a. 07/2019 NSTEMI/PCI: LM 15d, LAD 40p, 50/17m, RI nl, LCX nl, OM1 small, RCA large, 30p, 59m (3.0x12 Resolute Onyx DES), RPDA fills via L->R collats (OM1), EF 45-50%.  [name] GERD (gastroesophageal reflux disease)   . Hyperlipidemia LDL goal <70   . Ischemic cardiomyopathy    a. 07/30/2019 LV gram: EF 45-50%, basal inf HK; b. 07/31/2019 Echo: EF 55-60%, impaired relaxation, sev basal inf HK. Nl RV fxn.   . Joint pain   . Murmur    a. 07/2019 Echo: no significant valvular abnormalities. Mild AoV thickening.  [name] TIA (transient ischemic attack) 08/2014  . Type II diabetes mellitus (HCC)     Tobacco Use: Social History   Tobacco Use  Smoking Status Former Smoker  Smokeless Tobacco Never Used  Tobacco Comment   Quit 30 years ago    Labs: Recent Airline pilot for ITP Cardiac and Pulmonary Rehab Latest Ref Rng & Units 07/23/2014 07/29/2019 07/30/2019   Cholestrol 0 - 200 mg/dL 782(N) 562(Z) -   LDLCALC 0 - 99 mg/dL SEE COMMENT 308(M) -   HDL >40 mg/dL 57(Q) 47 -  Trlycerides <150 mg/dL 643(P) 295(J) -   Hemoglobin A1c 4.8 - 5.6 % - - 6.5(H)       Exercise Target Goals: Exercise Program Goal: Individual exercise prescription set using results from initial 6 min walk test and THRR while considering  patient's activity barriers and safety.   Exercise Prescription Goal: Initial exercise prescription builds to 30-45 minutes a day of aerobic activity, 2-3 days per week.  Home exercise guidelines will be given to patient during program as part of exercise prescription that the participant will acknowledge.  Activity Barriers & Risk Stratification: Activity Barriers & Cardiac Risk Stratification - 08/26/19 1412      Activity Barriers & Cardiac Risk Stratification   Activity Barriers  None   Still  some SOB, ? if Brilinta MD aware   Cardiac Risk Stratification  Moderate       6 Minute Walk: 6 Minute Walk    Row Name 08/27/19 1529         6 Minute Walk   Phase  Initial     Distance  1365 feet     Walk Time  6 minutes     # of Rest Breaks  0     MPH  2.6     METS  3.5     RPE  9     Perceived Dyspnea   0     VO2 Peak  12.24     Symptoms  No     Resting HR  83 bpm     Resting BP  120/62     Resting Oxygen Saturation   97 %     Exercise Oxygen Saturation  during 6 min walk  98 %     Max Ex. HR  116 bpm     Max Ex. BP  160/64     2 Minute Post BP  124/64        Oxygen Initial Assessment:   Oxygen Re-Evaluation:   Oxygen Discharge (Final Oxygen Re-Evaluation):   Initial Exercise Prescription: Initial Exercise Prescription - 08/27/19 1500      Date of Initial Exercise RX and Referring Provider   Date  08/27/19    Referring Provider  End      Recumbant Bike   Level  3    RPM  60    Watts  33    Minutes  15    METs  3.5      NuStep   Level  3    SPM  80    Minutes  15    METs  3.5      Arm Ergometer   Level  1    RPM  25    Minutes  15    METs  3.5      Recumbant Elliptical   Level  2    RPM  50    Minutes  15    METs  3.5      REL-XR   Level  3    Speed  50    Minutes  15    METs  3.5      Prescription Details   Frequency (times per week)  3    Duration  Progress to 30 minutes of continuous aerobic without signs/symptoms of physical distress      Intensity   THRR 40-80% of Max Heartrate  110-138    Ratings of Perceived Exertion  11-13    Perceived Dyspnea  0-4  Resistance Training   Training Prescription  Yes    Weight  3 lb    Reps  10-15       Perform Capillary Blood Glucose checks as needed.  Exercise Prescription Changes: Exercise Prescription Changes    Row Name 08/27/19 1500             Response to Exercise   Blood Pressure (Admit)  120/62       Blood Pressure (Exercise)  160/64       Blood Pressure  (Exit)  124/64       Heart Rate (Admit)  83 bpm       Heart Rate (Exercise)  116 bpm       Heart Rate (Exit)  86 bpm       Oxygen Saturation (Admit)  97 %       Oxygen Saturation (Exit)  98 %       Rating of Perceived Exertion (Exercise)  9       Perceived Dyspnea (Exercise)  0       Symptoms  none          Exercise Comments:   Exercise Goals and Review: Exercise Goals    Row Name 08/27/19 1536             Exercise Goals   Increase Physical Activity  Yes       Intervention  Provide advice, education, support and counseling about physical activity/exercise needs.;Develop an individualized exercise prescription for aerobic and resistive training based on initial evaluation findings, risk stratification, comorbidities and participant's personal goals.       Expected Outcomes  Short Term: Attend rehab on a regular basis to increase amount of physical activity.;Long Term: Add in home exercise to make exercise part of routine and to increase amount of physical activity.;Long Term: Exercising regularly at least 3-5 days a week.       Increase Strength and Stamina  Yes       Intervention  Provide advice, education, support and counseling about physical activity/exercise needs.;Develop an individualized exercise prescription for aerobic and resistive training based on initial evaluation findings, risk stratification, comorbidities and participant's personal goals.       Expected Outcomes  Short Term: Increase workloads from initial exercise prescription for resistance, speed, and METs.;Short Term: Perform resistance training exercises routinely during rehab and add in resistance training at home;Long Term: Improve cardiorespiratory fitness, muscular endurance and strength as measured by increased METs and functional capacity ( )       Able to understand and use rate of perceived exertion (RPE) scale  Yes       Intervention  Provide education and explanation on how to use RPE scale        Expected Outcomes  Short Term: Able to use RPE daily in rehab to express subjective intensity level;Long Term:  Able to use RPE to guide intensity level when exercising independently       Able to understand and use Dyspnea scale  Yes       Intervention  Provide education and explanation on how to use Dyspnea scale       Expected Outcomes  Short Term: Able to use Dyspnea scale daily in rehab to express subjective sense of shortness of breath during exertion;Long Term: Able to use Dyspnea scale to guide intensity level when exercising independently       Knowledge and understanding of Target Heart Rate Range (THRR)  Yes       Intervention  Provide education and explanation of THRR including how the numbers were predicted and where they are located for reference       Expected Outcomes  Short Term: Able to state/look up THRR;Short Term: Able to use daily as guideline for intensity in rehab;Long Term: Able to use THRR to govern intensity when exercising independently       Able to check pulse independently  Yes       Intervention  Provide education and demonstration on how to check pulse in carotid and radial arteries.;Review the importance of being able to check your own pulse for safety during independent exercise       Expected Outcomes  Short Term: Able to explain why pulse checking is important during independent exercise;Long Term: Able to check pulse independently and accurately       Understanding of Exercise Prescription  Yes          Exercise Goals Re-Evaluation :   Discharge Exercise Prescription (Final Exercise Prescription Changes): Exercise Prescription Changes - 08/27/19 1500      Response to Exercise   Blood Pressure (Admit)  120/62    Blood Pressure (Exercise)  160/64    Blood Pressure (Exit)  124/64    Heart Rate (Admit)  83 bpm    Heart Rate (Exercise)  116 bpm    Heart Rate (Exit)  86 bpm    Oxygen Saturation (Admit)  97 %    Oxygen Saturation (Exit)  98 %    Rating of  Perceived Exertion (Exercise)  9    Perceived Dyspnea (Exercise)  0    Symptoms  none       Nutrition:  Target Goals: Understanding of nutrition guidelines, daily intake of sodium 1500mg , cholesterol 200mg , calories 30% from fat and 7% or less from saturated fats, daily to have 5 or more servings of fruits and vegetables.  Biometrics: Pre Biometrics - 08/27/19 1537      Pre Biometrics   Height  5' 3.25" (1.607 m)    Weight  151 lb 11.2 oz (68.8 kg)    BMI (Calculated)  26.65    Single Leg Stand  30 seconds        Nutrition Therapy Plan and Nutrition Goals: Nutrition Therapy & Goals - 08/27/19 1524      Nutrition Therapy   Diet  DM, HH, low Na diet    Protein (specify units)  55g    Fiber  25 grams    Whole Grain Foods  3 servings    Saturated Fats  12 max. grams    Fruits and Vegetables  5 servings/day    Sodium  1.5 grams      Personal Nutrition Goals   Nutrition Goal  ST: continue with current changes pt did not want to make changes LT: not have another heart attack    Comments  Pt reports having cheerios with whole milk, white bread toast with butter. L sandwich with white bread and deli meat. D: chicken, fish, pork chops grilled with salad (raspberry vinegarette). Pt reports not enjoying the changes as she doesn't like to cook meat except red meat and doesn't like salad. Discussed some ways to improve cooking time and preparation, pt was not receptive. Discussed DM, HH friendly eating. Pt reports not wanting to make any changes.      Intervention Plan   Intervention  Prescribe, educate and counsel regarding individualized specific dietary modifications aiming towards targeted core components such as weight, hypertension, lipid management, diabetes, heart  failure and other comorbidities.;Nutrition handout(s) given to patient.    Expected Outcomes  Short Term Goal: Understand basic principles of dietary content, such as calories, fat, sodium, cholesterol and  nutrients.;Short Term Goal: A plan has been developed with personal nutrition goals set during dietitian appointment.;Long Term Goal: Adherence to prescribed nutrition plan.       Nutrition Assessments: Nutrition Assessments - 08/27/19 1535      MEDFICTS Scores   Pre Score  54       Nutrition Goals Re-Evaluation:   Nutrition Goals Discharge (Final Nutrition Goals Re-Evaluation):   Psychosocial: Target Goals: Acknowledge presence or absence of significant depression and/or stress, maximize coping skills, provide positive support system. Participant is able to verbalize types and ability to use techniques and skills needed for reducing stress and depression.   Initial Review & Psychosocial Screening: Initial Psych Review & Screening - 08/26/19 1413      Initial Review   Current issues with  None Identified      Family Dynamics   Good Support System?  Yes   Husband and children.     Barriers   Psychosocial barriers to participate in program  There are no identifiable barriers or psychosocial needs.;The patient should benefit from training in stress management and relaxation.      Screening Interventions   Interventions  Encouraged to exercise;Provide feedback about the scores to participant;To provide support and resources with identified psychosocial needs    Expected Outcomes  Short Term goal: Utilizing psychosocial counselor, staff and physician to assist with identification of specific Stressors or current issues interfering with healing process. Setting desired goal for each stressor or current issue identified.;Long Term Goal: Stressors or current issues are controlled or eliminated.;Short Term goal: Identification and review with participant of any Quality of Life or Depression concerns found by scoring the questionnaire.;Long Term goal: The participant improves quality of Life and PHQ9 Scores as seen by post scores and/or verbalization of changes       Quality of Life  Scores:  Quality of Life - 08/27/19 1539      Quality of Life   Select  Quality of Life      Quality of Life Scores   Health/Function Pre  22.18 %    Socioeconomic Pre  24.5 %    Psych/Spiritual Pre  24 %    Family Pre  26.6 %    GLOBAL Pre  23.73 %      Scores of 19 and below usually indicate a poorer quality of life in these areas.  A difference of  2-3 points is a clinically meaningful difference.  A difference of 2-3 points in the total score of the Quality of Life Index has been associated with significant improvement in overall quality of life, self-image, physical symptoms, and general health in studies assessing change in quality of life.  PHQ-9: Recent Review Flowsheet Data    Depression screen [facility] 2/9 08/27/2019   Decreased Interest 1   Down, Depressed, Hopeless 1   PHQ - 2 Score 2   Altered sleeping 0   Tired, decreased energy 2   Change in appetite 1   Feeling bad or failure about yourself  0   Trouble concentrating 0   Moving slowly or fidgety/restless 0   Suicidal thoughts 0   PHQ-9 Score 5   Difficult doing work/chores Not difficult at all     Interpretation of Total Score  Total Score Depression Severity:  1-4 = Minimal depression, 5-9 =  Mild depression, 10-14 = Moderate depression, 15-19 = Moderately severe depression, 20-27 = Severe depression   Psychosocial Evaluation and Intervention:   Psychosocial Re-Evaluation:   Psychosocial Discharge (Final Psychosocial Re-Evaluation):   Vocational Rehabilitation: Provide vocational rehab assistance to qualifying candidates.   Vocational Rehab Evaluation & Intervention: Vocational Rehab - 08/26/19 1416      Initial Vocational Rehab Evaluation & Intervention   Assessment shows need for Vocational Rehabilitation  No       Education: Education Goals: Education classes will be provided on a variety of topics geared toward better understanding of heart health and risk factor modification. Participant will  state understanding/return demonstration of topics presented as noted by education test scores.  Learning Barriers/Preferences: Learning Barriers/Preferences - 08/26/19 1421      Learning Barriers/Preferences   Learning Barriers  None    Learning Preferences  None       Education Topics:  AED/CPR: - Group verbal and written instruction with the use of models to demonstrate the basic use of the AED with the basic ABC's of resuscitation.   General Nutrition Guidelines/Fats and Fiber: -Group instruction provided by verbal, written material, models and posters to present the general guidelines for heart healthy nutrition. Gives an explanation and review of dietary fats and fiber.   Controlling Sodium/Reading Food Labels: -Group verbal and written material supporting the discussion of sodium use in heart healthy nutrition. Review and explanation with models, verbal and written materials for utilization of the food label.   Exercise Physiology & General Exercise Guidelines: - Group verbal and written instruction with models to review the exercise physiology of the cardiovascular system and associated critical values. Provides general exercise guidelines with specific guidelines to those with heart or lung disease.    Aerobic Exercise & Resistance Training: - Gives group verbal and written instruction on the various components of exercise. Focuses on aerobic and resistive training programs and the benefits of this training and how to safely progress through these programs..   Flexibility, Balance, Mind/Body Relaxation: Provides group verbal/written instruction on the benefits of flexibility and balance training, including mind/body exercise modes such as yoga, pilates and tai chi.  Demonstration and skill practice provided.   Stress and Anxiety: - Provides group verbal and written instruction about the health risks of elevated stress and causes of high stress.  Discuss the correlation  between heart/lung disease and anxiety and treatment options. Review healthy ways to manage with stress and anxiety.   Depression: - Provides group verbal and written instruction on the correlation between heart/lung disease and depressed mood, treatment options, and the stigmas associated with seeking treatment.   Anatomy & Physiology of the Heart: - Group verbal and written instruction and models provide basic cardiac anatomy and physiology, with the coronary electrical and arterial systems. Review of Valvular disease and Heart Failure   Cardiac Procedures: - Group verbal and written instruction to review commonly prescribed medications for heart disease. Reviews the medication, class of the drug, and side effects. Includes the steps to properly store meds and maintain the prescription regimen. (beta blockers and nitrates)   Cardiac Medications I: - Group verbal and written instruction to review commonly prescribed medications for heart disease. Reviews the medication, class of the drug, and side effects. Includes the steps to properly store meds and maintain the prescription regimen.   Cardiac Medications II: -Group verbal and written instruction to review commonly prescribed medications for heart disease. Reviews the medication, class of the drug, and side effects. (all  other drug classes)    Go Sex-Intimacy & Heart Disease, Get SMART - Goal Setting: - Group verbal and written instruction through game format to discuss heart disease and the return to sexual intimacy. Provides group verbal and written material to discuss and apply goal setting through the application of the S.M.A.R.T. Method.   Other Matters of the Heart: - Provides group verbal, written materials and models to describe Stable Angina and Peripheral Artery. Includes description of the disease process and treatment options available to the cardiac patient.   Exercise & Equipment Safety: - Individual verbal  instruction and demonstration of equipment use and safety with use of the equipment.   Cardiac Rehab from 08/27/2019 in [facility] Cardiac and Pulmonary Rehab  Date  08/27/19  Educator  AS  Instruction Review Code  1- Verbalizes Understanding      Infection Prevention: - Provides verbal and written material to individual with discussion of infection control including proper hand washing and proper equipment cleaning during exercise session.   Cardiac Rehab from 08/27/2019 in [facility] Cardiac and Pulmonary Rehab  Date  08/27/19  Educator  AS  Instruction Review Code  1- Verbalizes Understanding      Falls Prevention: - Provides verbal and written material to individual with discussion of falls prevention and safety.   Cardiac Rehab from 08/27/2019 in [facility] Cardiac and Pulmonary Rehab  Date  08/27/19  Educator  AS  Instruction Review Code  1- Verbalizes Understanding      Diabetes: - Individual verbal and written instruction to review signs/symptoms of diabetes, desired ranges of glucose level fasting, after meals and with exercise. Acknowledge that pre and post exercise glucose checks will be done for 3 sessions at entry of program.   Cardiac Rehab from 08/27/2019 in [facility] Cardiac and Pulmonary Rehab  Date  08/27/19  Educator  [facility]  Instruction Review Code  1- Verbalizes Understanding      Know Your Numbers and Risk Factors: -Group verbal and written instruction about important numbers in your health.  Discussion of what are risk factors and how they play a role in the disease process.  Review of Cholesterol, Blood Pressure, Diabetes, and BMI and the role they play in your overall health.   Sleep Hygiene: -Provides group verbal and written instruction about how sleep can affect your health.  Define sleep hygiene, discuss sleep cycles and impact of sleep habits. Review good sleep hygiene tips.    Other: -Provides group and verbal instruction on various topics (see comments)   Knowledge  Questionnaire Score: Knowledge Questionnaire Score - 08/27/19 1537      Knowledge Questionnaire Score   Pre Score  24/26       Core Components/Risk Factors/Patient Goals at Admission: Personal Goals and Risk Factors at Admission - 08/27/19 1542      Core Components/Risk Factors/Patient Goals on Admission    Weight Management  Yes    Intervention  Weight Management: Develop a combined nutrition and exercise program designed to reach desired caloric intake, while maintaining appropriate intake of nutrient and fiber, sodium and fats, and appropriate energy expenditure required for the weight goal.;Weight Management: Provide education and appropriate resources to help participant work on and attain dietary goals.    Admit Weight  151 lb 11.2 oz (68.8 kg)    Goal Weight: Short Term  150 lb (68 kg)    Goal Weight: Long Term  150 lb (68 kg)    Expected Outcomes  Short Term: Continue to assess and modify interventions  until short term weight is achieved;Long Term: Adherence to nutrition and physical activity/exercise program aimed toward attainment of established weight goal;Weight Loss: Understanding of general recommendations for a balanced deficit meal plan, which promotes 1-2 lb weight loss per week and includes a negative energy balance of [phone] kcal/d       Core Components/Risk Factors/Patient Goals Review:    Core Components/Risk Factors/Patient Goals at Discharge (Final Review):    ITP Comments: ITP Comments    Row Name 08/26/19 1427           ITP Comments  Virtual Orientation completed today. Has appt tomorrow with RP/RD for evals  and for gym orientation. Documentation of diagnosis can be found in [facility] 8/17.          Comments: initial ITP

## 2019-08-27 NOTE — Patient Instructions (Addendum)
Patient Instructions  Patient Details  Name: Donna Harper MRN: [id number] Date of Birth: Apr 03, 1951 Referring Provider:  [name], MD  Below are your personal goals for exercise, nutrition, and risk factors. Our goal is to help you stay on track towards obtaining and maintaining these goals. We will be discussing your progress on these goals with you throughout the program.  Initial Exercise Prescription: Initial Exercise Prescription - 08/27/19 1500      Date of Initial Exercise RX and Referring Provider   Date  08/27/19    Referring Provider  End      Recumbant Bike   Level  3    RPM  60    Watts  33    Minutes  15    METs  3.5      NuStep   Level  3    SPM  80    Minutes  15    METs  3.5      Arm Ergometer   Level  1    RPM  25    Minutes  15    METs  3.5      Recumbant Elliptical   Level  2    RPM  50    Minutes  15    METs  3.5      REL-XR   Level  3    Speed  50    Minutes  15    METs  3.5      Prescription Details   Frequency (times per week)  3    Duration  Progress to 30 minutes of continuous aerobic without signs/symptoms of physical distress      Intensity   THRR 40-80% of Max Heartrate  110-138    Ratings of Perceived Exertion  11-13    Perceived Dyspnea  0-4      Resistance Training   Training Prescription  Yes    Weight  3 lb    Reps  10-15       Exercise Goals: Frequency: Be able to perform aerobic exercise two to three times per week in program working toward 2-5 days per week of home exercise.  Intensity: Work with a perceived exertion of 11 (fairly light) - 15 (hard) while following your exercise prescription.  We will make changes to your prescription with you as you progress through the program.   Duration: Be able to do 30 to 45 minutes of continuous aerobic exercise in addition to a 5 minute warm-up and a 5 minute cool-down routine.   Nutrition Goals: Your personal nutrition goals will be established when you do  your nutrition analysis with the dietician.  The following are general nutrition guidelines to follow: Cholesterol < 200mg /day Sodium < 1500mg /day Fiber: Women over 50 yrs - 21 grams per day  Personal Goals: Personal Goals and Risk Factors at Admission - 08/27/19 1542      Core Components/Risk Factors/Patient Goals on Admission    Weight Management  Yes    Intervention  Weight Management: Develop a combined nutrition and exercise program designed to reach desired caloric intake, while maintaining appropriate intake of nutrient and fiber, sodium and fats, and appropriate energy expenditure required for the weight goal.;Weight Management: Provide education and appropriate resources to help participant work on and attain dietary goals.    Admit Weight  151 lb 11.2 oz (68.8 kg)    Goal Weight: Short Term  150 lb (68 kg)    Goal Weight: Long Term  150 lb (68 kg)    Expected Outcomes  Short Term: Continue to assess and modify interventions until short term weight is achieved;Long Term: Adherence to nutrition and physical activity/exercise program aimed toward attainment of established weight goal;Weight Loss: Understanding of general recommendations for a balanced deficit meal plan, which promotes 1-2 lb weight loss per week and includes a negative energy balance of [phone] kcal/d       Tobacco Use Initial Evaluation: Social History   Tobacco Use  Smoking Status Former Smoker  Smokeless Tobacco Never Used  Tobacco Comment   Quit 30 years ago    Exercise Goals and Review: Exercise Goals    Row Name 08/27/19 1536             Exercise Goals   Increase Physical Activity  Yes       Intervention  Provide advice, education, support and counseling about physical activity/exercise needs.;Develop an individualized exercise prescription for aerobic and resistive training based on initial evaluation findings, risk stratification, comorbidities and participant's personal goals.       Expected  Outcomes  Short Term: Attend rehab on a regular basis to increase amount of physical activity.;Long Term: Add in home exercise to make exercise part of routine and to increase amount of physical activity.;Long Term: Exercising regularly at least 3-5 days a week.       Increase Strength and Stamina  Yes       Intervention  Provide advice, education, support and counseling about physical activity/exercise needs.;Develop an individualized exercise prescription for aerobic and resistive training based on initial evaluation findings, risk stratification, comorbidities and participant's personal goals.       Expected Outcomes  Short Term: Increase workloads from initial exercise prescription for resistance, speed, and METs.;Short Term: Perform resistance training exercises routinely during rehab and add in resistance training at home;Long Term: Improve cardiorespiratory fitness, muscular endurance and strength as measured by increased METs and functional capacity ( )       Able to understand and use rate of perceived exertion (RPE) scale  Yes       Intervention  Provide education and explanation on how to use RPE scale       Expected Outcomes  Short Term: Able to use RPE daily in rehab to express subjective intensity level;Long Term:  Able to use RPE to guide intensity level when exercising independently       Able to understand and use Dyspnea scale  Yes       Intervention  Provide education and explanation on how to use Dyspnea scale       Expected Outcomes  Short Term: Able to use Dyspnea scale daily in rehab to express subjective sense of shortness of breath during exertion;Long Term: Able to use Dyspnea scale to guide intensity level when exercising independently       Knowledge and understanding of Target Heart Rate Range (THRR)  Yes       Intervention  Provide education and explanation of THRR including how the numbers were predicted and where they are located for reference       Expected Outcomes   Short Term: Able to state/look up THRR;Short Term: Able to use daily as guideline for intensity in rehab;Long Term: Able to use THRR to govern intensity when exercising independently       Able to check pulse independently  Yes       Intervention  Provide education and demonstration on how to check pulse in carotid and radial arteries.;Review  the importance of being able to check your own pulse for safety during independent exercise       Expected Outcomes  Short Term: Able to explain why pulse checking is important during independent exercise;Long Term: Able to check pulse independently and accurately       Understanding of Exercise Prescription  Yes          Copy of goals given to participant.

## 2019-08-29 ENCOUNTER — Other Ambulatory Visit: Payer: Self-pay

## 2019-08-29 DIAGNOSIS — I214 Non-ST elevation (NSTEMI) myocardial infarction: Secondary | ICD-10-CM | POA: Diagnosis not present

## 2019-08-29 DIAGNOSIS — Z955 Presence of coronary angioplasty implant and graft: Secondary | ICD-10-CM

## 2019-08-29 LAB — GLUCOSE, CAPILLARY
Glucose-Capillary: 174 mg/dL — ABNORMAL HIGH (ref 70–99)
Glucose-Capillary: 122 mg/dL — ABNORMAL HIGH (ref 70–99)

## 2019-08-29 NOTE — Progress Notes (Signed)
Daily Session Note  Patient Details  Name: Donna Harper MRN: [id number] Date of Birth: [birth year] Referring Provider:     Cardiac Rehab from 08/27/2019 in [facility] Cardiac and Pulmonary Rehab  Referring Provider  End      Encounter Date: 08/29/2019  Check In:      Social History   Tobacco Use  Smoking Status Former Smoker  Smokeless Tobacco Never Used  Tobacco Comment   Quit 30 years ago    Goals Met:  Exercise tolerated well Personal goals reviewed No report of cardiac concerns or symptoms Strength training completed today  Goals Unmet:  Not Applicable  Comments: First full day of exercise!  Patient was oriented to gym and equipment including functions, settings, policies, and procedures.  Patient's individual exercise prescription and treatment plan were reviewed.  All starting workloads were established based on the results of the 6 minute walk test done at initial orientation visit.  The plan for exercise progression was also introduced and progression will be customized based on patient's performance and goals.    Dr. Emily Filbert is Medical Director for [place] and [facility].

## 2019-09-03 ENCOUNTER — Ambulatory Visit: Payer: Medicare Other

## 2019-09-03 ENCOUNTER — Encounter: Payer: Medicare Other | Admitting: *Deleted

## 2019-09-03 ENCOUNTER — Other Ambulatory Visit: Payer: Self-pay

## 2019-09-03 DIAGNOSIS — I214 Non-ST elevation (NSTEMI) myocardial infarction: Secondary | ICD-10-CM

## 2019-09-03 DIAGNOSIS — Z955 Presence of coronary angioplasty implant and graft: Secondary | ICD-10-CM

## 2019-09-03 LAB — GLUCOSE, CAPILLARY: Glucose-Capillary: 132 mg/dL — ABNORMAL HIGH (ref 70–99)

## 2019-09-03 NOTE — Progress Notes (Signed)
Daily Session Note  Patient Details  Name: Donna Harper MRN: [id number] Date of Birth: December 17, 1950 Referring Provider:     Cardiac Rehab from 08/27/2019 in [facility] Cardiac and Pulmonary Rehab  Referring Provider  End      Encounter Date: 09/03/2019  Check In: Session Check In - 09/03/19 1127      Check-In   Supervising physician immediately available to respond to emergencies  See telemetry face sheet for immediately available ER MD    Location  [facility]-Cardiac & Pulmonary Rehab    Staff Present  Heath Lark, RN, BSN, CCRP;Joseph Hood RCP,RRT,BSRT;Amanda [place], [place], ACSM CEP, Exercise Physiologist    Virtual Visit  No    Medication changes reported      No    Fall or balance concerns reported     No    Warm-up and Cool-down  Performed on first and last piece of equipment    Resistance Training Performed  Yes    VAD Patient?  No    PAD/SET Patient?  No      Pain Assessment   Currently in Pain?  No/denies          Social History   Tobacco Use  Smoking Status Former Smoker  Smokeless Tobacco Never Used  Tobacco Comment   Quit 30 years ago    Goals Met:  Exercise tolerated well No report of cardiac concerns or symptoms  Goals Unmet:  Not Applicable  Comments: Pt able to follow exercise prescription today without complaint.  Will continue to monitor for progression.    Dr. Emily Filbert is Medical Director for [place] and [facility].

## 2019-09-05 ENCOUNTER — Ambulatory Visit: Payer: Medicare Other

## 2019-09-05 ENCOUNTER — Encounter: Payer: Medicare Other | Admitting: *Deleted

## 2019-09-05 ENCOUNTER — Other Ambulatory Visit: Payer: Self-pay

## 2019-09-05 DIAGNOSIS — I214 Non-ST elevation (NSTEMI) myocardial infarction: Secondary | ICD-10-CM

## 2019-09-05 DIAGNOSIS — Z955 Presence of coronary angioplasty implant and graft: Secondary | ICD-10-CM

## 2019-09-05 LAB — GLUCOSE, CAPILLARY: Glucose-Capillary: 136 mg/dL — ABNORMAL HIGH (ref 70–99)

## 2019-09-05 NOTE — Progress Notes (Signed)
Daily Session Note  Patient Details  Name: Donna Harper MRN: [id number] Date of Birth: 23-Feb-1951 Referring Provider:     Cardiac Rehab from 08/27/2019 in [facility] Cardiac and Pulmonary Rehab  Referring Provider  End      Encounter Date: 09/05/2019  Check In: Session Check In - 09/05/19 1122      Check-In   Supervising physician immediately available to respond to emergencies  See telemetry face sheet for immediately available ER MD    Location  [facility]-Cardiac & Pulmonary Rehab    Staff Present  Gerlene Burdock, RN, BSN-BC, CCRP;Amanda Sommer, BA, ACSM CEP, Exercise Physiologist;Jessica Seward, MA, RCEP, CCRP, CCET;Jeanna Durrell BS, Exercise Physiologist    Virtual Visit  No    Medication changes reported      No    Fall or balance concerns reported     No    Warm-up and Cool-down  Performed on first and last piece of equipment    Resistance Training Performed  Yes    VAD Patient?  No    PAD/SET Patient?  No      Pain Assessment   Currently in Pain?  No/denies          Social History   Tobacco Use  Smoking Status Former Smoker  Smokeless Tobacco Never Used  Tobacco Comment   Quit 30 years ago    Goals Met:  Exercise tolerated well No report of cardiac concerns or symptoms Strength training completed today  Goals Unmet:  Not Applicable  Comments: Pt able to follow exercise prescription today without complaint.  Will continue to monitor for progression.    Dr. Emily Filbert is Medical Director for [place] and [facility].

## 2019-09-10 ENCOUNTER — Encounter: Payer: Medicare Other | Admitting: *Deleted

## 2019-09-10 ENCOUNTER — Other Ambulatory Visit: Payer: Self-pay

## 2019-09-10 ENCOUNTER — Ambulatory Visit: Payer: Medicare Other

## 2019-09-10 DIAGNOSIS — I214 Non-ST elevation (NSTEMI) myocardial infarction: Secondary | ICD-10-CM

## 2019-09-10 DIAGNOSIS — Z955 Presence of coronary angioplasty implant and graft: Secondary | ICD-10-CM

## 2019-09-10 NOTE — Progress Notes (Signed)
Daily Session Note  Patient Details  Name: Donna Harper MRN: [id number] Date of Birth: June 05, 1951 Referring Provider:     Cardiac Rehab from 08/27/2019 in [facility] Cardiac and [facility]monary Rehab  Referring Provider  End      Encounter Date: 09/10/2019  Check In: Session Check In - 09/10/19 1131      Check-In   Supervising physician immediately available to respond to emergencies  See telemetry face sheet for immediately available ER MD    Location  [facility]-Cardiac & Pulmonary Rehab    Staff Present  Nada Maclachlan, BA, ACSM CEP, Exercise Physiologist;[place] Anagnos Frederico Hamman, RN BSN;Diane Joya Gaskins RN,BSN;Joseph Tessie Fass RCP,RRT,BSRT    Virtual Visit  No    Medication changes reported      No    Fall or balance concerns reported     No    Warm-up and Cool-down  Performed on first and last piece of equipment    Resistance Training Performed  Yes    VAD Patient?  No    PAD/SET Patient?  No      Pain Assessment   Currently in Pain?  No/denies          Social History   Tobacco Use  Smoking Status Former Smoker  Smokeless Tobacco Never Used  Tobacco Comment   Quit 30 years ago    Goals Met:  Independence with exercise equipment Exercise tolerated well No report of cardiac concerns or symptoms Strength training completed today  Goals Unmet:  Not Applicable  Comments: Pt able to follow exercise prescription today without complaint.  Will continue to monitor for progression.    Dr. Emily Filbert is Medical Director for [place] and [facility].

## 2019-09-12 ENCOUNTER — Encounter: Payer: Medicare Other | Attending: Internal Medicine | Admitting: *Deleted

## 2019-09-12 ENCOUNTER — Ambulatory Visit: Payer: Medicare Other

## 2019-09-12 ENCOUNTER — Other Ambulatory Visit: Payer: Self-pay

## 2019-09-12 DIAGNOSIS — Z955 Presence of coronary angioplasty implant and graft: Secondary | ICD-10-CM | POA: Diagnosis not present

## 2019-09-12 DIAGNOSIS — Z79899 Other long term (current) drug therapy: Secondary | ICD-10-CM | POA: Insufficient documentation

## 2019-09-12 DIAGNOSIS — E785 Hyperlipidemia, unspecified: Secondary | ICD-10-CM | POA: Insufficient documentation

## 2019-09-12 DIAGNOSIS — E119 Type 2 diabetes mellitus without complications: Secondary | ICD-10-CM | POA: Diagnosis not present

## 2019-09-12 DIAGNOSIS — Z87891 Personal history of nicotine dependence: Secondary | ICD-10-CM | POA: Diagnosis not present

## 2019-09-12 DIAGNOSIS — I214 Non-ST elevation (NSTEMI) myocardial infarction: Secondary | ICD-10-CM | POA: Diagnosis present

## 2019-09-12 DIAGNOSIS — Z7982 Long term (current) use of aspirin: Secondary | ICD-10-CM | POA: Insufficient documentation

## 2019-09-12 DIAGNOSIS — Z7984 Long term (current) use of oral hypoglycemic drugs: Secondary | ICD-10-CM | POA: Diagnosis not present

## 2019-09-12 DIAGNOSIS — I2510 Atherosclerotic heart disease of native coronary artery without angina pectoris: Secondary | ICD-10-CM | POA: Diagnosis not present

## 2019-09-12 DIAGNOSIS — I255 Ischemic cardiomyopathy: Secondary | ICD-10-CM | POA: Insufficient documentation

## 2019-09-12 DIAGNOSIS — Z8673 Personal history of transient ischemic attack (TIA), and cerebral infarction without residual deficits: Secondary | ICD-10-CM | POA: Insufficient documentation

## 2019-09-12 NOTE — Progress Notes (Signed)
Daily Session Note  Patient Details  Name: Donna Harper MRN: [id number] Date of Birth: [birth year] Referring Provider:     Cardiac Rehab from 08/27/2019 in [facility] Cardiac and Pulmonary Rehab  Referring Provider  End      Encounter Date: 09/12/2019  Check In: Session Check In - 09/12/19 1113      Check-In   Supervising physician immediately available to respond to emergencies  See telemetry face sheet for immediately available ER MD    Location  [facility]-Cardiac & Pulmonary Rehab    Staff Present  Renita Papa, RN Vickki Hearing, BA, ACSM CEP, Exercise Physiologist;Jessica Luan Pulling, MA, RCEP, CCRP, CCET    Virtual Visit  No    Medication changes reported      No    Fall or balance concerns reported     No    Warm-up and Cool-down  Performed on first and last piece of equipment    Resistance Training Performed  Yes    VAD Patient?  No    PAD/SET Patient?  No      Pain Assessment   Currently in Pain?  No/denies          Social History   Tobacco Use  Smoking Status Former Smoker  Smokeless Tobacco Never Used  Tobacco Comment   Quit 30 years ago    Goals Met:  Independence with exercise equipment Exercise tolerated well No report of cardiac concerns or symptoms Strength training completed today  Goals Unmet:  Not Applicable  Comments: Pt able to follow exercise prescription today without complaint.  Will continue to monitor for progression.    Dr. Emily Filbert is Medical Director for [place] and [facility].

## 2019-09-16 ENCOUNTER — Ambulatory Visit: Payer: Medicare Other | Admitting: Internal Medicine

## 2019-09-16 NOTE — Progress Notes (Deleted)
Follow-up Outpatient Visit Date: 09/16/2019  Primary Care Provider: Cletis Athens, MD [address] [place] [address]  Chief Complaint: ***  HPI:  Donna Harper is a 68 y.o. year-old female with history of coronary artery disease status post PCI to the RCA in the setting of NSTEMI (07/2019), hyperlipidemia, type 2 diabetes mellitus, TIA, and GERD, who presents for follow-up of coronary artery disease.  She was last seen in our office in early September following PCI to the RCA.  She was doing well at that time other than significant bilateral lower extremity myalgias in the setting of low-dose rosuvastatin use.  She also complained of some dyspnea that she attributed to ticagrelor; she was amenable to continuation of ticagrelor.  She was subsequently seen by Dr. Debara Pickett in the lipid clinic and started on Praluent.  --------------------------------------------------------------------------------------------------  Past Medical History:  Diagnosis Date  . CAD (coronary artery disease)    a. 07/2019 NSTEMI/PCI: LM 15d, LAD 40p, 50/71m, RI nl, LCX nl, OM1 small, RCA large, 30p, 87m (3.0x12 Resolute Onyx DES), RPDA fills via L->R collats (OM1), EF 45-50%.  [name] GERD (gastroesophageal reflux disease)   . Hyperlipidemia LDL goal <70   . Ischemic cardiomyopathy    a. 07/30/2019 LV gram: EF 45-50%, basal inf HK; b. 07/31/2019 Echo: EF 55-60%, impaired relaxation, sev basal inf HK. Nl RV fxn.   . Joint pain   . Murmur    a. 07/2019 Echo: no significant valvular abnormalities. Mild AoV thickening.  [name] TIA (transient ischemic attack) 08/2014  . Type II diabetes mellitus ([place])    Past Surgical History:  Procedure Laterality Date  . BREAST BIOPSY Bilateral 1988   benign/Dr Jamal Collin  . CESAREAN SECTION  1986  . CORONARY STENT INTERVENTION N/A 07/30/2019   Procedure: CORONARY STENT INTERVENTION;  Surgeon: Nelva Bush, MD;  Location: [facility];  Service: Cardiovascular;  Laterality: N/A;   RCA  . LEFT HEART CATH AND CORONARY ANGIOGRAPHY N/A 07/30/2019   Procedure: LEFT HEART CATH AND CORONARY ANGIOGRAPHY;  Surgeon: Nelva Bush, MD;  Location: [facility];  Service: Cardiovascular;  Laterality: N/A;    No outpatient medications have been marked as taking for the 09/16/19 encounter (Appointment) with Jenavie Stanczak, Harrell Gave, MD.    Allergies: Atorvastatin, Erythromycin, Levaquin [levofloxacin in d5w], Penicillins, Rosuvastatin, Simvastatin, Tequin [gatifloxacin], Zoloft [sertraline hcl], and Zithromax [azithromycin]  Social History   Tobacco Use  . Smoking status: Former Research scientist (life sciences)  . Smokeless tobacco: Never Used  . Tobacco comment: Quit 30 years ago  Substance Use Topics  . Alcohol use: No    Alcohol/week: 0.0 standard drinks  . Drug use: No    Family History  Problem Relation Age of Onset  . COPD Mother   . COPD Father     Review of Systems: A 12-system review of systems was performed and was negative except as noted in the HPI.  --------------------------------------------------------------------------------------------------  Physical Exam: There were no vitals taken for this visit.  General:  *** HEENT: No conjunctival pallor or scleral icterus. Moist mucous membranes.  OP clear. Neck: Supple without lymphadenopathy, thyromegaly, JVD, or HJR. No carotid bruit. Lungs: Normal work of breathing. Clear to auscultation bilaterally without wheezes or crackles. Heart: Regular rate and rhythm without murmurs, rubs, or gallops. Non-displaced PMI. Abd: Bowel sounds present. Soft, NT/ND without hepatosplenomegaly Ext: No lower extremity edema. Radial, PT, and DP pulses are 2+ bilaterally. Skin: Warm and dry without rash.  EKG:  ***  Lab Results  Component Value  Date   WBC 5.9 07/31/2019   HGB 11.2 (L) 07/31/2019   HCT 32.5 (L) 07/31/2019   MCV 87.6 07/31/2019   PLT 166 07/31/2019    Lab Results  Component Value Date   NA 140 07/31/2019   K 3.4 (L)  07/31/2019   CL 106 07/31/2019   CO2 26 07/31/2019   BUN 15 07/31/2019   CREATININE 0.71 07/31/2019   GLUCOSE 159 (H) 07/31/2019   ALT 26 07/22/2014    Lab Results  Component Value Date   CHOL 264 (H) 07/29/2019   HDL 47 07/29/2019   LDLCALC 143 (H) 07/29/2019   TRIG 372 (H) 07/29/2019   CHOLHDL 5.6 07/29/2019    --------------------------------------------------------------------------------------------------  ASSESSMENT AND PLAN: Nelva Bush, MD 09/16/2019 7:27 AM

## 2019-09-17 ENCOUNTER — Ambulatory Visit: Payer: Medicare Other

## 2019-09-18 ENCOUNTER — Encounter: Payer: Self-pay | Admitting: *Deleted

## 2019-09-18 DIAGNOSIS — Z955 Presence of coronary angioplasty implant and graft: Secondary | ICD-10-CM

## 2019-09-18 DIAGNOSIS — I214 Non-ST elevation (NSTEMI) myocardial infarction: Secondary | ICD-10-CM

## 2019-09-18 NOTE — Progress Notes (Signed)
Cardiac Individual Treatment Plan  Patient Details  Name: Donna Harper MRN: [id number] Date of Birth: [birth year] Referring Provider:     Cardiac Rehab from 08/27/2019 in [facility] Cardiac and Pulmonary Rehab  Referring Provider  End      Initial Encounter Date:    Cardiac Rehab from 08/27/2019 in [facility] Cardiac and Pulmonary Rehab  Date  08/27/19      Visit Diagnosis: NSTEMI (non-ST elevation myocardial infarction) [facility])  Status post coronary artery stent placement  Patient's Home Medications on Admission:  Current Outpatient Medications:  .  Alirocumab (PRALUENT) 150 MG/ML SOAJ, Inject 150 mg into the skin every 14 (fourteen) days., Disp: 2 pen, Rfl: 11 .  aspirin 81 MG tablet, Take 81 mg by mouth daily., Disp: , Rfl:  .  cholecalciferol (VITAMIN D) 1000 UNITS tablet, Take 1,000 Units by mouth daily., Disp: , Rfl:  .  diphenhydrAMINE (BENADRYL) 25 MG tablet, Take 25 mg by mouth at bedtime., Disp: , Rfl:  .  glipiZIDE (GLUCOTROL XL) 5 MG 24 hr tablet, Take 5 mg by mouth daily., Disp: , Rfl:  .  losartan (COZAAR) 100 MG tablet, Take 100 mg by mouth daily., Disp: , Rfl:  .  metFORMIN (GLUCOPHAGE) 1000 MG tablet, Take 1,000 mg by mouth 2 (two) times daily with a meal., Disp: , Rfl:  .  metoprolol tartrate (LOPRESSOR) 25 MG tablet, Take 1 tablet (25 mg total) by mouth 2 (two) times daily., Disp: 60 tablet, Rfl: 11 .  Multiple Vitamin (MULTIVITAMIN WITH MINERALS) TABS tablet, Take 1 tablet by mouth daily., Disp: , Rfl:  .  NASONEX 50 MCG/ACT nasal spray, Place 2 sprays into the nose daily. , Disp: , Rfl: 0 .  nitroGLYCERIN (NITROSTAT) 0.4 MG SL tablet, Place 1 tablet (0.4 mg total) under the tongue every 5 (five) minutes as needed for chest pain., Disp: 90 tablet, Rfl: 3 .  nystatin (MYCOSTATIN) 100000 UNIT/ML suspension, Take 5 mLs by mouth as needed., Disp: , Rfl:  .  ticagrelor (BRILINTA) 90 MG TABS tablet, Take 1 tablet (90 mg total) by mouth 2 (two) times daily., Disp: 60 tablet, Rfl:  11 .  vitamin E 400 UNIT capsule, Take 400 Units by mouth daily., Disp: , Rfl:   Past Medical History: Past Medical History:  Diagnosis Date  . CAD (coronary artery disease)    a. 07/2019 NSTEMI/PCI: LM 15d, LAD 40p, 50/74m, RI nl, LCX nl, OM1 small, RCA large, 30p, 25m (3.0x12 Resolute Onyx DES), RPDA fills via L->R collats (OM1), EF 45-50%.  [name] GERD (gastroesophageal reflux disease)   . Hyperlipidemia LDL goal <70   . Ischemic cardiomyopathy    a. 07/30/2019 LV gram: EF 45-50%, basal inf HK; b. 07/31/2019 Echo: EF 55-60%, impaired relaxation, sev basal inf HK. Nl RV fxn.   . Joint pain   . Murmur    a. 07/2019 Echo: no significant valvular abnormalities. Mild AoV thickening.  [name] TIA (transient ischemic attack) 08/2014  . Type II diabetes mellitus (HCC)     Tobacco Use: Social History   Tobacco Use  Smoking Status Former Smoker  Smokeless Tobacco Never Used  Tobacco Comment   Quit 30 years ago    Labs: Recent Airline pilot for ITP Cardiac and Pulmonary Rehab Latest Ref Rng & Units 07/23/2014 07/29/2019 07/30/2019   Cholestrol 0 - 200 mg/dL 409(W) 119(J) -   LDLCALC 0 - 99 mg/dL SEE COMMENT 478(G) -   HDL >40 mg/dL 95(A) 47 -  Trlycerides <150 mg/dL 161(W) 960(A) -   Hemoglobin A1c 4.8 - 5.6 % - - 6.5(H)       Exercise Target Goals: Exercise Program Goal: Individual exercise prescription set using results from initial 6 min walk test and THRR while considering  patient's activity barriers and safety.   Exercise Prescription Goal: Initial exercise prescription builds to 30-45 minutes a day of aerobic activity, 2-3 days per week.  Home exercise guidelines will be given to patient during program as part of exercise prescription that the participant will acknowledge.  Activity Barriers & Risk Stratification: Activity Barriers & Cardiac Risk Stratification - 08/26/19 1412      Activity Barriers & Cardiac Risk Stratification   Activity Barriers  None   Still  some SOB, ? if Brilinta MD aware   Cardiac Risk Stratification  Moderate       6 Minute Walk: 6 Minute Walk    Row Name 08/27/19 1529         6 Minute Walk   Phase  Initial     Distance  1365 feet     Walk Time  6 minutes     # of Rest Breaks  0     MPH  2.6     METS  3.5     RPE  9     Perceived Dyspnea   0     VO2 Peak  12.24     Symptoms  No     Resting HR  83 bpm     Resting BP  120/62     Resting Oxygen Saturation   97 %     Exercise Oxygen Saturation  during 6 min walk  98 %     Max Ex. HR  116 bpm     Max Ex. BP  160/64     2 Minute Post BP  124/64        Oxygen Initial Assessment:   Oxygen Re-Evaluation:   Oxygen Discharge (Final Oxygen Re-Evaluation):   Initial Exercise Prescription: Initial Exercise Prescription - 08/27/19 1500      Date of Initial Exercise RX and Referring Provider   Date  08/27/19    Referring Provider  End      Recumbant Bike   Level  3    RPM  60    Watts  33    Minutes  15    METs  3.5      NuStep   Level  3    SPM  80    Minutes  15    METs  3.5      Arm Ergometer   Level  1    RPM  25    Minutes  15    METs  3.5      Recumbant Elliptical   Level  2    RPM  50    Minutes  15    METs  3.5      REL-XR   Level  3    Speed  50    Minutes  15    METs  3.5      Prescription Details   Frequency (times per week)  3    Duration  Progress to 30 minutes of continuous aerobic without signs/symptoms of physical distress      Intensity   THRR 40-80% of Max Heartrate  110-138    Ratings of Perceived Exertion  11-13    Perceived Dyspnea  0-4  Resistance Training   Training Prescription  Yes    Weight  3 lb    Reps  10-15       Perform Capillary Blood Glucose checks as needed.  Exercise Prescription Changes: Exercise Prescription Changes    Row Name 08/27/19 1500 08/30/19 0700 09/11/19 1400         Response to Exercise   Blood Pressure (Admit)  120/62  114/60  106/64     Blood Pressure  (Exercise)  160/64  122/64  144/80     Blood Pressure (Exit)  124/64  120/64  108/64     Heart Rate (Admit)  83 bpm  77 bpm  77 bpm     Heart Rate (Exercise)  116 bpm  96 bpm  95 bpm     Heart Rate (Exit)  86 bpm  75 bpm  81 bpm     Oxygen Saturation (Admit)  97 %  -  -     Oxygen Saturation (Exit)  98 %  -  -     Rating of Perceived Exertion (Exercise)  9  13  16      Perceived Dyspnea (Exercise)  0  -  -     Symptoms  none  none  none     Duration  -  Continue with 30 min of aerobic exercise without signs/symptoms of physical distress.  Continue with 30 min of aerobic exercise without signs/symptoms of physical distress.     Intensity  -  -  THRR unchanged       Progression   Progression  -  Continue to progress workloads to maintain intensity without signs/symptoms of physical distress.  Continue to progress workloads to maintain intensity without signs/symptoms of physical distress.     Average METs  -  2.1  2.03       Resistance Training   Training Prescription  -  Yes  Yes     Weight  -  3 lb  3 lbs     Reps  -  10-15  10-15       Interval Training   Interval Training  -  -  No       Recumbant Bike   Level  -  -  3     Minutes  -  -  15     METs  -  -  2       NuStep   Level  -  3  3     SPM  -  80  -     Minutes  -  15  15     METs  -  2.1  2.2       Arm Ergometer   Level  -  -  1     Minutes  -  -  15     METs  -  -  1.9       Recumbant Elliptical   Level  -  2  -     RPM  -  50  -     Minutes  -  15  -       Biostep-RELP   Level  -  -  3     Minutes  -  -  15     METs  -  -  2        Exercise Comments: Exercise Comments    Row Name 08/29/19 1140  Exercise Comments  First full day of exercise!  Patient was oriented to gym and equipment including functions, settings, policies, and procedures.  Patient's individual exercise prescription and treatment plan were reviewed.  All starting workloads were established based on the results of the 6  minute walk test done at initial orientation visit.  The plan for exercise progression was also introduced and progression will be customized based on patient's performance and goals.          Exercise Goals and Review: Exercise Goals    Row Name 08/27/19 1536             Exercise Goals   Increase Physical Activity  Yes       Intervention  Provide advice, education, support and counseling about physical activity/exercise needs.;Develop an individualized exercise prescription for aerobic and resistive training based on initial evaluation findings, risk stratification, comorbidities and participant's personal goals.       Expected Outcomes  Short Term: Attend rehab on a regular basis to increase amount of physical activity.;Long Term: Add in home exercise to make exercise part of routine and to increase amount of physical activity.;Long Term: Exercising regularly at least 3-5 days a week.       Increase Strength and Stamina  Yes       Intervention  Provide advice, education, support and counseling about physical activity/exercise needs.;Develop an individualized exercise prescription for aerobic and resistive training based on initial evaluation findings, risk stratification, comorbidities and participant's personal goals.       Expected Outcomes  Short Term: Increase workloads from initial exercise prescription for resistance, speed, and METs.;Short Term: Perform resistance training exercises routinely during rehab and add in resistance training at home;Long Term: Improve cardiorespiratory fitness, muscular endurance and strength as measured by increased METs and functional capacity ( )       Able to understand and use rate of perceived exertion (RPE) scale  Yes       Intervention  Provide education and explanation on how to use RPE scale       Expected Outcomes  Short Term: Able to use RPE daily in rehab to express subjective intensity level;Long Term:  Able to use RPE to guide intensity  level when exercising independently       Able to understand and use Dyspnea scale  Yes       Intervention  Provide education and explanation on how to use Dyspnea scale       Expected Outcomes  Short Term: Able to use Dyspnea scale daily in rehab to express subjective sense of shortness of breath during exertion;Long Term: Able to use Dyspnea scale to guide intensity level when exercising independently       Knowledge and understanding of Target Heart Rate Range (THRR)  Yes       Intervention  Provide education and explanation of THRR including how the numbers were predicted and where they are located for reference       Expected Outcomes  Short Term: Able to state/look up THRR;Short Term: Able to use daily as guideline for intensity in rehab;Long Term: Able to use THRR to govern intensity when exercising independently       Able to check pulse independently  Yes       Intervention  Provide education and demonstration on how to check pulse in carotid and radial arteries.;Review the importance of being able to check your own pulse for safety during independent exercise       Expected Outcomes  Short Term: Able to explain why pulse checking is important during independent exercise;Long Term: Able to check pulse independently and accurately       Understanding of Exercise Prescription  Yes          Exercise Goals Re-Evaluation : Exercise Goals Re-Evaluation    Row Name 08/29/19 1140 09/10/19 1514           Exercise Goal Re-Evaluation   Exercise Goals Review  Increase Physical Activity;Increase Strength and Stamina;Able to understand and use rate of perceived exertion (RPE) scale;Knowledge and understanding of Target Heart Rate Range (THRR);Able to check pulse independently;Understanding of Exercise Prescription  Increase Physical Activity;Increase Strength and Stamina;Understanding of Exercise Prescription      Comments  Reviewed RPE scale, THR and program prescription with pt today.  Pt voiced  understanding and was given a copy of goals to take home.  Zamirah is doing well in rehab.  She has really taken to the NuStep.  We will continue to monitor her progress.      Expected Outcomes  Short: Use RPE daily to regulate intensity. Long: Follow program prescription in THR.  Short: Begin to increase workloads.  Long: Continue to improve stamina.         Discharge Exercise Prescription (Final Exercise Prescription Changes): Exercise Prescription Changes - 09/11/19 1400      Response to Exercise   Blood Pressure (Admit)  106/64    Blood Pressure (Exercise)  144/80    Blood Pressure (Exit)  108/64    Heart Rate (Admit)  77 bpm    Heart Rate (Exercise)  95 bpm    Heart Rate (Exit)  81 bpm    Rating of Perceived Exertion (Exercise)  16    Symptoms  none    Duration  Continue with 30 min of aerobic exercise without signs/symptoms of physical distress.    Intensity  THRR unchanged      Progression   Progression  Continue to progress workloads to maintain intensity without signs/symptoms of physical distress.    Average METs  2.03      Resistance Training   Training Prescription  Yes    Weight  3 lbs    Reps  10-15      Interval Training   Interval Training  No      Recumbant Bike   Level  3    Minutes  15    METs  2      NuStep   Level  3    Minutes  15    METs  2.2      Arm Ergometer   Level  1    Minutes  15    METs  1.9      Biostep-RELP   Level  3    Minutes  15    METs  2       Nutrition:  Target Goals: Understanding of nutrition guidelines, daily intake of sodium 1500mg , cholesterol 200mg , calories 30% from fat and 7% or less from saturated fats, daily to have 5 or more servings of fruits and vegetables.  Biometrics: Pre Biometrics - 08/27/19 1537      Pre Biometrics   Height  5' 3.25" (1.607 m)    Weight  151 lb 11.2 oz (68.8 kg)    BMI (Calculated)  26.65    Single Leg Stand  30 seconds        Nutrition Therapy Plan and Nutrition  Goals: Nutrition Therapy & Goals - 08/27/19 1524  Nutrition Therapy   Diet  DM, HH, low Na diet    Protein (specify units)  55g    Fiber  25 grams    Whole Grain Foods  3 servings    Saturated Fats  12 max. grams    Fruits and Vegetables  5 servings/day    Sodium  1.5 grams      Personal Nutrition Goals   Nutrition Goal  ST: continue with current changes pt did not want to make changes LT: not have another heart attack    Comments  Pt reports having cheerios with whole milk, white bread toast with butter. L sandwich with white bread and deli meat. D: chicken, fish, pork chops grilled with salad (raspberry vinegarette). Pt reports not enjoying the changes as she doesn't like to cook meat except red meat and doesn't like salad. Discussed some ways to improve cooking time and preparation, pt was not receptive. Discussed DM, HH friendly eating. Pt reports not wanting to make any changes.      Intervention Plan   Intervention  Prescribe, educate and counsel regarding individualized specific dietary modifications aiming towards targeted core components such as weight, hypertension, lipid management, diabetes, heart failure and other comorbidities.;Nutrition handout(s) given to patient.    Expected Outcomes  Short Term Goal: Understand basic principles of dietary content, such as calories, fat, sodium, cholesterol and nutrients.;Short Term Goal: A plan has been developed with personal nutrition goals set during dietitian appointment.;Long Term Goal: Adherence to prescribed nutrition plan.       Nutrition Assessments: Nutrition Assessments - 08/27/19 1535      MEDFICTS Scores   Pre Score  54       Nutrition Goals Re-Evaluation:   Nutrition Goals Discharge (Final Nutrition Goals Re-Evaluation):   Psychosocial: Target Goals: Acknowledge presence or absence of significant depression and/or stress, maximize coping skills, provide positive support system. Participant is able to verbalize  types and ability to use techniques and skills needed for reducing stress and depression.   Initial Review & Psychosocial Screening: Initial Psych Review & Screening - 08/26/19 1413      Initial Review   Current issues with  None Identified      Family Dynamics   Good Support System?  Yes   Husband and children.     Barriers   Psychosocial barriers to participate in program  There are no identifiable barriers or psychosocial needs.;The patient should benefit from training in stress management and relaxation.      Screening Interventions   Interventions  Encouraged to exercise;Provide feedback about the scores to participant;To provide support and resources with identified psychosocial needs    Expected Outcomes  Short Term goal: Utilizing psychosocial counselor, staff and physician to assist with identification of specific Stressors or current issues interfering with healing process. Setting desired goal for each stressor or current issue identified.;Long Term Goal: Stressors or current issues are controlled or eliminated.;Short Term goal: Identification and review with participant of any Quality of Life or Depression concerns found by scoring the questionnaire.;Long Term goal: The participant improves quality of Life and PHQ9 Scores as seen by post scores and/or verbalization of changes       Quality of Life Scores:  Quality of Life - 08/27/19 1539      Quality of Life   Select  Quality of Life      Quality of Life Scores   Health/Function Pre  22.18 %    Socioeconomic Pre  24.5 %  Psych/Spiritual Pre  24 %    Family Pre  26.6 %    GLOBAL Pre  23.73 %      Scores of 19 and below usually indicate a poorer quality of life in these areas.  A difference of  2-3 points is a clinically meaningful difference.  A difference of 2-3 points in the total score of the Quality of Life Index has been associated with significant improvement in overall quality of life, self-image, physical  symptoms, and general health in studies assessing change in quality of life.  PHQ-9: Recent Review Flowsheet Data    Depression screen [facility] 2/9 08/27/2019   Decreased Interest 1   Down, Depressed, Hopeless 1   PHQ - 2 Score 2   Altered sleeping 0   Tired, decreased energy 2   Change in appetite 1   Feeling bad or failure about yourself  0   Trouble concentrating 0   Moving slowly or fidgety/restless 0   Suicidal thoughts 0   PHQ-9 Score 5   Difficult doing work/chores Not difficult at all     Interpretation of Total Score  Total Score Depression Severity:  1-4 = Minimal depression, 5-9 = Mild depression, 10-14 = Moderate depression, 15-19 = Moderately severe depression, 20-27 = Severe depression   Psychosocial Evaluation and Intervention:   Psychosocial Re-Evaluation:   Psychosocial Discharge (Final Psychosocial Re-Evaluation):   Vocational Rehabilitation: Provide vocational rehab assistance to qualifying candidates.   Vocational Rehab Evaluation & Intervention: Vocational Rehab - 08/26/19 1416      Initial Vocational Rehab Evaluation & Intervention   Assessment shows need for Vocational Rehabilitation  No       Education: Education Goals: Education classes will be provided on a variety of topics geared toward better understanding of heart health and risk factor modification. Participant will state understanding/return demonstration of topics presented as noted by education test scores.  Learning Barriers/Preferences: Learning Barriers/Preferences - 08/26/19 1421      Learning Barriers/Preferences   Learning Barriers  None    Learning Preferences  None       Education Topics:  AED/CPR: - Group verbal and written instruction with the use of models to demonstrate the basic use of the AED with the basic ABC's of resuscitation.   General Nutrition Guidelines/Fats and Fiber: -Group instruction provided by verbal, written material, models and posters to present  the general guidelines for heart healthy nutrition. Gives an explanation and review of dietary fats and fiber.   Controlling Sodium/Reading Food Labels: -Group verbal and written material supporting the discussion of sodium use in heart healthy nutrition. Review and explanation with models, verbal and written materials for utilization of the food label.   Exercise Physiology & General Exercise Guidelines: - Group verbal and written instruction with models to review the exercise physiology of the cardiovascular system and associated critical values. Provides general exercise guidelines with specific guidelines to those with heart or lung disease.    Aerobic Exercise & Resistance Training: - Gives group verbal and written instruction on the various components of exercise. Focuses on aerobic and resistive training programs and the benefits of this training and how to safely progress through these programs..   Flexibility, Balance, Mind/Body Relaxation: Provides group verbal/written instruction on the benefits of flexibility and balance training, including mind/body exercise modes such as yoga, pilates and tai chi.  Demonstration and skill practice provided.   Stress and Anxiety: - Provides group verbal and written instruction about the health risks of elevated stress  and causes of high stress.  Discuss the correlation between heart/lung disease and anxiety and treatment options. Review healthy ways to manage with stress and anxiety.   Depression: - Provides group verbal and written instruction on the correlation between heart/lung disease and depressed mood, treatment options, and the stigmas associated with seeking treatment.   Anatomy & Physiology of the Heart: - Group verbal and written instruction and models provide basic cardiac anatomy and physiology, with the coronary electrical and arterial systems. Review of Valvular disease and Heart Failure   Cardiac Procedures: - Group verbal  and written instruction to review commonly prescribed medications for heart disease. Reviews the medication, class of the drug, and side effects. Includes the steps to properly store meds and maintain the prescription regimen. (beta blockers and nitrates)   Cardiac Medications I: - Group verbal and written instruction to review commonly prescribed medications for heart disease. Reviews the medication, class of the drug, and side effects. Includes the steps to properly store meds and maintain the prescription regimen.   Cardiac Medications II: -Group verbal and written instruction to review commonly prescribed medications for heart disease. Reviews the medication, class of the drug, and side effects. (all other drug classes)    Go Sex-Intimacy & Heart Disease, Get SMART - Goal Setting: - Group verbal and written instruction through game format to discuss heart disease and the return to sexual intimacy. Provides group verbal and written material to discuss and apply goal setting through the application of the S.M.A.R.T. Method.   Other Matters of the Heart: - Provides group verbal, written materials and models to describe Stable Angina and Peripheral Artery. Includes description of the disease process and treatment options available to the cardiac patient.   Exercise & Equipment Safety: - Individual verbal instruction and demonstration of equipment use and safety with use of the equipment.   Cardiac Rehab from 08/27/2019 in [facility] Cardiac and Pulmonary Rehab  Date  08/27/19  Educator  AS  Instruction Review Code  1- Verbalizes Understanding      Infection Prevention: - Provides verbal and written material to individual with discussion of infection control including proper hand washing and proper equipment cleaning during exercise session.   Cardiac Rehab from 08/27/2019 in [facility] Cardiac and Pulmonary Rehab  Date  08/27/19  Educator  AS  Instruction Review Code  1- Verbalizes Understanding       Falls Prevention: - Provides verbal and written material to individual with discussion of falls prevention and safety.   Cardiac Rehab from 08/27/2019 in [facility] Cardiac and Pulmonary Rehab  Date  08/27/19  Educator  AS  Instruction Review Code  1- Verbalizes Understanding      Diabetes: - Individual verbal and written instruction to review signs/symptoms of diabetes, desired ranges of glucose level fasting, after meals and with exercise. Acknowledge that pre and post exercise glucose checks will be done for 3 sessions at entry of program.   Cardiac Rehab from 08/27/2019 in [facility] Cardiac and Pulmonary Rehab  Date  08/27/19  Educator  [facility]  Instruction Review Code  1- Verbalizes Understanding      Know Your Numbers and Risk Factors: -Group verbal and written instruction about important numbers in your health.  Discussion of what are risk factors and how they play a role in the disease process.  Review of Cholesterol, Blood Pressure, Diabetes, and BMI and the role they play in your overall health.   Sleep Hygiene: -Provides group verbal and written instruction about how sleep can  affect your health.  Define sleep hygiene, discuss sleep cycles and impact of sleep habits. Review good sleep hygiene tips.    Other: -Provides group and verbal instruction on various topics (see comments)   Knowledge Questionnaire Score: Knowledge Questionnaire Score - 08/27/19 1537      Knowledge Questionnaire Score   Pre Score  24/26       Core Components/Risk Factors/Patient Goals at Admission: Personal Goals and Risk Factors at Admission - 08/27/19 1542      Core Components/Risk Factors/Patient Goals on Admission    Weight Management  Yes    Intervention  Weight Management: Develop a combined nutrition and exercise program designed to reach desired caloric intake, while maintaining appropriate intake of nutrient and fiber, sodium and fats, and appropriate energy expenditure required for the  weight goal.;Weight Management: Provide education and appropriate resources to help participant work on and attain dietary goals.    Admit Weight  151 lb 11.2 oz (68.8 kg)    Goal Weight: Short Term  150 lb (68 kg)    Goal Weight: Long Term  150 lb (68 kg)    Expected Outcomes  Short Term: Continue to assess and modify interventions until short term weight is achieved;Long Term: Adherence to nutrition and physical activity/exercise program aimed toward attainment of established weight goal;Weight Loss: Understanding of general recommendations for a balanced deficit meal plan, which promotes 1-2 lb weight loss per week and includes a negative energy balance of [id number] kcal/d       Core Components/Risk Factors/Patient Goals Review:    Core Components/Risk Factors/Patient Goals at Discharge (Final Review):    ITP Comments: ITP Comments    Row Name 08/26/19 1427 08/27/19 1544 09/18/19 1341       ITP Comments  Virtual Orientation completed today. Has appt tomorrow with RP/RD for evals  and for gym orientation. Documentation of diagnosis can be found in [facility] 8/17.  and nutrition consult completed. ITP created and sent to Dr Hyacinth Meeker for review.  30 day review completed. ITP sent to Dr. Bethann Punches, Medical Director of Cardiac and Pulmonary Rehab. Continue with ITP unless changes are made by physician.  Department closed starting 10/2 until further notice by infection prevention and Health at Work teams for COVID-19.        Comments: 30 day review

## 2019-09-19 ENCOUNTER — Ambulatory Visit: Payer: Medicare Other

## 2019-09-24 ENCOUNTER — Other Ambulatory Visit: Payer: Self-pay

## 2019-09-24 ENCOUNTER — Ambulatory Visit: Payer: Medicare Other

## 2019-09-24 ENCOUNTER — Encounter: Payer: Medicare Other | Admitting: *Deleted

## 2019-09-24 DIAGNOSIS — I214 Non-ST elevation (NSTEMI) myocardial infarction: Secondary | ICD-10-CM | POA: Diagnosis not present

## 2019-09-24 DIAGNOSIS — Z955 Presence of coronary angioplasty implant and graft: Secondary | ICD-10-CM

## 2019-09-24 NOTE — Progress Notes (Signed)
Daily Session Note  Patient Details  Name: Donna Harper MRN: [id number] Date of Birth: 04/04/1951 Referring Provider:     Cardiac Rehab from 08/27/2019 in [facility] Cardiac and Pulmonary Rehab  Referring Provider  End      Encounter Date: 09/24/2019  Check In: Session Check In - 09/24/19 1211      Check-In   Supervising physician immediately available to respond to emergencies  See telemetry face sheet for immediately available ER MD    Location  [facility]-Cardiac & Pulmonary Rehab    Staff Present  Heath Lark, RN, BSN, CCRP;Laureen Owens Shark, BS, RRT, CPFT;Joseph Flagler [employer]    Virtual Visit  No    Medication changes reported      No    Fall or balance concerns reported     No    Warm-up and Cool-down  Performed on first and last piece of equipment    Resistance Training Performed  Yes    VAD Patient?  No    PAD/SET Patient?  No      Pain Assessment   Currently in Pain?  No/denies          Social History   Tobacco Use  Smoking Status Former Smoker  Smokeless Tobacco Never Used  Tobacco Comment   Quit 30 years ago    Goals Met:  Independence with exercise equipment Exercise tolerated well No report of cardiac concerns or symptoms  Goals Unmet:  Not Applicable  Comments: Pt able to follow exercise prescription today without complaint.  Will continue to monitor for progression. PHQ score 5  Will repeat in 30 days   Dr. Emily Filbert is Medical Director for [place] and [facility].

## 2019-09-25 ENCOUNTER — Encounter: Payer: Self-pay | Admitting: Internal Medicine

## 2019-09-25 ENCOUNTER — Ambulatory Visit (INDEPENDENT_AMBULATORY_CARE_PROVIDER_SITE_OTHER): Payer: Medicare Other | Admitting: Internal Medicine

## 2019-09-25 VITALS — BP 140/72 | HR 86 | Temp 97.3°F | Ht 63.0 in | Wt 150.5 lb

## 2019-09-25 DIAGNOSIS — R0602 Shortness of breath: Secondary | ICD-10-CM

## 2019-09-25 DIAGNOSIS — E785 Hyperlipidemia, unspecified: Secondary | ICD-10-CM

## 2019-09-25 DIAGNOSIS — I255 Ischemic cardiomyopathy: Secondary | ICD-10-CM | POA: Diagnosis not present

## 2019-09-25 DIAGNOSIS — I2510 Atherosclerotic heart disease of native coronary artery without angina pectoris: Secondary | ICD-10-CM

## 2019-09-25 DIAGNOSIS — I10 Essential (primary) hypertension: Secondary | ICD-10-CM | POA: Diagnosis not present

## 2019-09-25 MED ORDER — CLOPIDOGREL BISULFATE 75 MG PO TABS: 75.0000 mg | ORAL_TABLET | Freq: Every day | ORAL | 3 refills | Status: DC

## 2019-09-25 NOTE — Progress Notes (Signed)
Follow-up Outpatient Visit Date: 09/25/2019  Primary Care Provider: [facility] [place], [place] [address] [place] [address]  Chief Complaint: Shortness of breath  HPI:  Donna Harper is a 68 y.o. year-old female with history of coronary artery disease status post PCI to the RCA in the setting of NSTEMI (07/2019), hypertension, hyperlipidemia, type 2 diabetes mellitus, TIA, and GERD, who presents for follow-up of coronary artery disease.  She was last seen in our office in early September following PCI to the RCA.  She was doing well at that time other than significant bilateral lower extremity myalgias in the setting of low-dose rosuvastatin use.  She also complained of some dyspnea that she attributed to ticagrelor; she was amenable to continuation of ticagrelor.  She was subsequently seen by Dr. Debara Pickett in the lipid clinic and started on Praluent.  She is tolerating this well.  Today, Donna Harper is most concerned about shortness of breath that began around the time of her MI.  It is most noticeable when she bends over or when she is "rushing."  She intermittently notes a "dull ache" in her left upper chest, which is not exertional and typically lasts 30 seconds or less.  The chest pain is not reminiscent of what she experienced at the time of her NSTEMI; she predominantly had left-sided jaw pain.  Donna Harper notes occasional palpitations with her shortness of breath but denies lightheadedness.  --------------------------------------------------------------------------------------------------  Past Medical History:  Diagnosis Date  . CAD (coronary artery disease)    a. 07/2019 NSTEMI/PCI: LM 15d, LAD 40p, 50/20m, RI nl, LCX nl, OM1 small, RCA large, 30p, 68m (3.0x12 Resolute Onyx DES), RPDA fills via L->R collats (OM1), EF 45-50%.  [name] GERD (gastroesophageal reflux disease)   . Hyperlipidemia LDL goal <70   . Ischemic cardiomyopathy    a. 07/30/2019 LV gram: EF 45-50%, basal inf HK; b. 07/31/2019  Echo: EF 55-60%, impaired relaxation, sev basal inf HK. Nl RV fxn.   . Joint pain   . Murmur    a. 07/2019 Echo: no significant valvular abnormalities. Mild AoV thickening.  [name] TIA (transient ischemic attack) 08/2014  . Type II diabetes mellitus ([place])    Past Surgical History:  Procedure Laterality Date  . BREAST BIOPSY Bilateral 1988   benign/Dr Jamal Collin  . CESAREAN SECTION  1986  . CORONARY STENT INTERVENTION N/A 07/30/2019   Procedure: CORONARY STENT INTERVENTION;  Surgeon: Nelva Bush, MD;  Location: [place] CV LAB;  Service: Cardiovascular;  Laterality: N/A;  RCA  . LEFT HEART CATH AND CORONARY ANGIOGRAPHY N/A 07/30/2019   Procedure: LEFT HEART CATH AND CORONARY ANGIOGRAPHY;  Surgeon: Nelva Bush, MD;  Location: [place] CV [facility];  Service: Cardiovascular;  Laterality: N/A;    Current Meds  Medication Sig  . Alirocumab (PRALUENT) 150 MG/ML SOAJ Inject 150 mg into the skin every 14 (fourteen) days.  [name] aspirin 81 MG tablet Take 81 mg by mouth daily.  . cholecalciferol (VITAMIN D) 1000 UNITS tablet Take 1,000 Units by mouth daily.  . diphenhydrAMINE (BENADRYL) 25 MG tablet Take 25 mg by mouth at bedtime.  [name] glipiZIDE (GLUCOTROL XL) 5 MG 24 hr tablet Take 5 mg by mouth daily.  [name] losartan (COZAAR) 100 MG tablet Take 100 mg by mouth daily.  . metFORMIN (GLUCOPHAGE) 1000 MG tablet Take 1,000 mg by mouth 2 (two) times daily with a meal.  . metoprolol tartrate (LOPRESSOR) 25 MG tablet Take 1 tablet (25 mg total) by mouth 2 (two) times daily.  [name]  Multiple Vitamin (MULTIVITAMIN WITH MINERALS) TABS tablet Take 1 tablet by mouth daily.  [name] NASONEX 50 MCG/ACT nasal spray Place 2 sprays into the nose daily.   . nitroGLYCERIN (NITROSTAT) 0.4 MG SL tablet Place 1 tablet (0.4 mg total) under the tongue every 5 (five) minutes as needed for chest pain.  [name] nystatin (MYCOSTATIN) 100000 UNIT/ML suspension Take 5 mLs by mouth as needed.  . ticagrelor (BRILINTA) 90 MG TABS tablet Take 1 tablet  (90 mg total) by mouth 2 (two) times daily.  . vitamin E 400 UNIT capsule Take 400 Units by mouth daily.    Allergies: Atorvastatin, Erythromycin, Levaquin [levofloxacin in d5w], Penicillins, Rosuvastatin, Simvastatin, Tequin [gatifloxacin], Zoloft [sertraline hcl], and Zithromax [azithromycin]  Social History   Tobacco Use  . Smoking status: Former Research scientist (life sciences)  . Smokeless tobacco: Never Used  . Tobacco comment: Quit 30 years ago  Substance Use Topics  . Alcohol use: No    Alcohol/week: 0.0 standard drinks  . Drug use: No    Family History  Problem Relation Age of Onset  . COPD Mother   . COPD Father     Review of Systems: A 12-system review of systems was performed and was negative except as noted in the HPI.  --------------------------------------------------------------------------------------------------  Physical Exam: BP 140/72 (BP Location: Left Arm, Patient Position: Sitting, Cuff Size: Normal)   Pulse 86   Temp (!) 97.3 F (36.3 C)   Ht 5\' 3"  (1.6 m)   Wt 150 lb 8 oz (68.3 kg)   SpO2 97%   BMI 26.66 kg/m   General:  NAD HEENT: No conjunctival pallor or scleral icterus. Facemask in place Neck: Supple without lymphadenopathy, thyromegaly, JVD, or HJR. Lungs: Normal work of breathing. Clear to auscultation bilaterally without wheezes or crackles. Heart: Regular rate and rhythm without murmurs, rubs, or gallops. Non-displaced PMI. Abd: Bowel sounds present. Soft, NT/ND without hepatosplenomegaly Ext: No lower extremity edema. Radial, PT, and DP pulses are 2+ bilaterally. Skin: Warm and dry without rash.  EKG:  NSR with low voltage.  Otherwise, no significant abnormality.  Lab Results  Component Value Date   WBC 5.9 07/31/2019   HGB 11.2 (L) 07/31/2019   HCT 32.5 (L) 07/31/2019   MCV 87.6 07/31/2019   PLT 166 07/31/2019    Lab Results  Component Value Date   NA 140 07/31/2019   K 3.4 (L) 07/31/2019   CL 106 07/31/2019   CO2 26 07/31/2019   BUN 15  07/31/2019   CREATININE 0.71 07/31/2019   GLUCOSE 159 (H) 07/31/2019   ALT 26 07/22/2014    Lab Results  Component Value Date   CHOL 264 (H) 07/29/2019   HDL 47 07/29/2019   LDLCALC 143 (H) 07/29/2019   TRIG 372 (H) 07/29/2019   CHOLHDL 5.6 07/29/2019    --------------------------------------------------------------------------------------------------  ASSESSMENT AND PLAN: Coronary artery disease with shortness of breath: I am most suspicious for side effects from ticagrelor underlying Donna Harper's shortness of breath.  He chest pain is atypical and different than the angina that she experienced at the time of her NSTEMI in August.  However, she has unrevascularized moderate CAD involving the left coronary artery.  We have agreed to switch from ticagrelor to clopidogrel.  If her dyspnea does not improve or her chest pain worsens, we will need to consider a functional study to assess the hemodynamic significance of her left coronary artery disease.  Ischemic cardiomyopathy: Donna Harper appears euvolemic with NYHA class II-III symptoms.  However, I wonder if  her dyspnea is due to ticagrelor rather than CAD/HF.  We will transition to clopidogrel, as above.  We will continue current doses of metoprolol and losartan.  Hyperlipidemia: Donna Harper is tolerating Praluent well.  We will plan to repeat a fasting lipid and CMP shortly before her follow-up appointment with [place] in 1 month.  Hypertension: Continue metoprolol and losartan.  Follow-up: Return to clinic in 1 month.  Nelva Bush, MD 09/25/2019 4:19 PM

## 2019-09-25 NOTE — Patient Instructions (Signed)
Medication Instructions:  Your physician has recommended you make the following change in your medication:  1- STOP Brilinta. 2- START Plavix (clopidogrel) 75 mg by mouth once a day.  If you need a refill on your cardiac medications before your next appointment, please call your pharmacy.   Lab work: Your physician recommends that you return for lab work in: about 1 [place] TO APPT.  - Please go to the [facility]. You will check in at the front desk to the right as you walk into the atrium. Valet Parking is offered if needed. - No appointment needed. You may go any day between 7 am and 6 pm. - You will need to be fasting.  Checking LIPID, CMP.   If you have labs (blood work) drawn today and your tests are completely normal, you will receive your results only by: [name] MyChart Message (if you have MyChart) OR . A paper copy in the mail If you have any lab test that is abnormal or we need to change your treatment, we will call you to review the results.  Testing/Procedures: NONE  Follow-Up: At [facility], you and your health needs are our priority.  As part of our continuing mission to provide you with exceptional heart care, we have created designated Provider Care Teams.  These Care Teams include your primary Cardiologist (physician) and Advanced Practice Providers (APPs -  Physician Assistants and Nurse Practitioners) who all work together to provide you with the care you need, when you need it. You will need a follow up appointment in 1 months.   You may see Nelva Bush, MD or one of the following Advanced Practice Providers on your designated Care Team:   Murray Hodgkins, NP Christell Faith, PA-C . Marrianne Mood, PA-C

## 2019-09-26 ENCOUNTER — Ambulatory Visit: Payer: Medicare Other

## 2019-09-26 ENCOUNTER — Encounter: Payer: Self-pay | Admitting: Internal Medicine

## 2019-09-26 DIAGNOSIS — I2510 Atherosclerotic heart disease of native coronary artery without angina pectoris: Secondary | ICD-10-CM | POA: Insufficient documentation

## 2019-09-26 DIAGNOSIS — E1169 Type 2 diabetes mellitus with other specified complication: Secondary | ICD-10-CM | POA: Insufficient documentation

## 2019-09-26 DIAGNOSIS — I10 Essential (primary) hypertension: Secondary | ICD-10-CM | POA: Insufficient documentation

## 2019-09-26 DIAGNOSIS — E782 Mixed hyperlipidemia: Secondary | ICD-10-CM | POA: Insufficient documentation

## 2019-09-26 DIAGNOSIS — E785 Hyperlipidemia, unspecified: Secondary | ICD-10-CM | POA: Insufficient documentation

## 2019-09-26 DIAGNOSIS — I255 Ischemic cardiomyopathy: Secondary | ICD-10-CM | POA: Insufficient documentation

## 2019-09-26 DIAGNOSIS — R0602 Shortness of breath: Secondary | ICD-10-CM | POA: Insufficient documentation

## 2019-10-01 ENCOUNTER — Encounter: Payer: Medicare Other | Admitting: *Deleted

## 2019-10-01 ENCOUNTER — Other Ambulatory Visit: Payer: Self-pay

## 2019-10-01 DIAGNOSIS — I214 Non-ST elevation (NSTEMI) myocardial infarction: Secondary | ICD-10-CM | POA: Diagnosis not present

## 2019-10-01 DIAGNOSIS — Z955 Presence of coronary angioplasty implant and graft: Secondary | ICD-10-CM

## 2019-10-01 NOTE — Progress Notes (Signed)
Daily Session Note  Patient Details  Name: Donna Harper MRN: [id number] Date of Birth: 1951-01-13 Referring Provider:     Cardiac Rehab from 08/27/2019 in [facility] Cardiac and Pulmonary Rehab  Referring Provider  End      Encounter Date: 10/01/2019  Check In: Session Check In - 10/01/19 1121      Check-In   Supervising physician immediately available to respond to emergencies  See telemetry face sheet for immediately available ER MD    Location  [facility]-Cardiac & Pulmonary Rehab    Staff Present  Heath Lark, RN, BSN, CCRP;Amanda Sommer, BA, ACSM CEP, Exercise Physiologist;Joseph Hood RCP,RRT,BSRT    Virtual Visit  No    Medication changes reported      No    Fall or balance concerns reported     No    Warm-up and Cool-down  Performed on first and last piece of equipment    Resistance Training Performed  Yes    VAD Patient?  No    PAD/SET Patient?  No      Pain Assessment   Currently in Pain?  No/denies          Social History   Tobacco Use  Smoking Status Former Smoker  Smokeless Tobacco Never Used  Tobacco Comment   Quit 30 years ago    Goals Met:  Independence with exercise equipment Exercise tolerated well No report of cardiac concerns or symptoms  Goals Unmet:  Not Applicable  Comments: Pt able to follow exercise prescription today without complaint.  Will continue to monitor for progression. Reviewed home exercise with pt today.  Pt plans to do videos and strength  for exercise.  Reviewed THR, pulse, RPE, sign and symptoms, NTG use, and when to call 911 or MD.  Also discussed weather considerations and indoor options.  Pt voiced understanding. Pt able to follow exercise prescription today without complaint.  Will continue to monitor for progression.      Dr. Emily Filbert is Medical Director for [place] and [facility].

## 2019-10-03 ENCOUNTER — Other Ambulatory Visit: Payer: Self-pay

## 2019-10-03 ENCOUNTER — Encounter: Payer: Medicare Other | Admitting: *Deleted

## 2019-10-03 DIAGNOSIS — I214 Non-ST elevation (NSTEMI) myocardial infarction: Secondary | ICD-10-CM

## 2019-10-03 DIAGNOSIS — Z955 Presence of coronary angioplasty implant and graft: Secondary | ICD-10-CM

## 2019-10-03 NOTE — Progress Notes (Signed)
Daily Session Note  Patient Details  Name: Donna Harper MRN: [id number] Date of Birth: 07-Jan-1951 Referring Provider:     Cardiac Rehab from 08/27/2019 in [facility] Cardiac and Pulmonary Rehab  Referring Provider  End      Encounter Date: 10/03/2019  Check In: Session Check In - 10/03/19 1150      Check-In   Supervising physician immediately available to respond to emergencies  See telemetry face sheet for immediately available ER MD    Location  [facility]-Cardiac & Pulmonary Rehab    Staff Present  Heath Lark, RN, BSN, CCRP;Jeanna Durrell BS, Exercise Physiologist;Joseph Hood RCP,RRT,BSRT    Virtual Visit  No    Medication changes reported      No    Fall or balance concerns reported     No    Warm-up and Cool-down  Performed on first and last piece of equipment    Resistance Training Performed  Yes    VAD Patient?  No    PAD/SET Patient?  No      Pain Assessment   Currently in Pain?  No/denies          Social History   Tobacco Use  Smoking Status Former Smoker  Smokeless Tobacco Never Used  Tobacco Comment   Quit 30 years ago    Goals Met:  Independence with exercise equipment Exercise tolerated well No report of cardiac concerns or symptoms  Goals Unmet:  Not Applicable  Comments: Pt able to follow exercise prescription today without complaint.  Will continue to monitor for progression.    Dr. Emily Filbert is Medical Director for [place] and [facility].

## 2019-10-08 ENCOUNTER — Other Ambulatory Visit: Payer: Self-pay

## 2019-10-08 ENCOUNTER — Encounter: Payer: Medicare Other | Admitting: *Deleted

## 2019-10-08 DIAGNOSIS — Z955 Presence of coronary angioplasty implant and graft: Secondary | ICD-10-CM

## 2019-10-08 DIAGNOSIS — I214 Non-ST elevation (NSTEMI) myocardial infarction: Secondary | ICD-10-CM | POA: Diagnosis not present

## 2019-10-08 NOTE — Progress Notes (Signed)
Daily Session Note  Patient Details  Name: COLIE JOSTEN MRN: [id number] Date of Birth: 09/10/1951 Referring Provider:     Cardiac Rehab from 08/27/2019 in [facility] Cardiac and Pulmonary Rehab  Referring Provider  End      Encounter Date: 10/08/2019  Check In: Session Check In - 10/08/19 1121      Check-In   Supervising physician immediately available to respond to emergencies  See telemetry face sheet for immediately available ER MD    Location  [facility]-Cardiac & Pulmonary Rehab    Staff Present  Darel Hong, RN BSN;Joseph Foy [place], [place], ACSM CEP, Exercise Physiologist    Virtual Visit  No    Medication changes reported      No    Fall or balance concerns reported     No    Warm-up and Cool-down  Performed on first and last piece of equipment    Resistance Training Performed  Yes    VAD Patient?  No    PAD/SET Patient?  No      Pain Assessment   Currently in Pain?  No/denies          Social History   Tobacco Use  Smoking Status Former Smoker  Smokeless Tobacco Never Used  Tobacco Comment   Quit 30 years ago    Goals Met:  Independence with exercise equipment Exercise tolerated well No report of cardiac concerns or symptoms Strength training completed today  Goals Unmet:  Not Applicable  Comments: Pt able to follow exercise prescription today without complaint.  Will continue to monitor for progression.    Dr. Emily Filbert is Medical Director for [place] and [facility].

## 2019-10-10 ENCOUNTER — Other Ambulatory Visit: Payer: Self-pay

## 2019-10-10 ENCOUNTER — Encounter: Payer: Medicare Other | Admitting: *Deleted

## 2019-10-10 DIAGNOSIS — Z955 Presence of coronary angioplasty implant and graft: Secondary | ICD-10-CM

## 2019-10-10 DIAGNOSIS — I214 Non-ST elevation (NSTEMI) myocardial infarction: Secondary | ICD-10-CM | POA: Diagnosis not present

## 2019-10-10 NOTE — Progress Notes (Signed)
Daily Session Note  Patient Details  Name: Donna Harper MRN: [id number] Date of Birth: September 22, 1951 Referring Provider:     Cardiac Rehab from 08/27/2019 in [facility] Cardiac and Pulmonary Rehab  Referring Provider  End      Encounter Date: 10/10/2019  Check In: Session Check In - 10/10/19 1103      Check-In   Supervising physician immediately available to respond to emergencies  See telemetry face sheet for immediately available ER MD    Location  [facility]-Cardiac & Pulmonary Rehab    Staff Present  Renita Papa, RN Vickki Hearing, BA, ACSM CEP, Exercise Physiologist;Jeanna Durrell BS, Exercise Physiologist    Virtual Visit  No    Medication changes reported      No    Fall or balance concerns reported     No    Warm-up and Cool-down  Performed on first and last piece of equipment    Resistance Training Performed  Yes    VAD Patient?  No    PAD/SET Patient?  No      Pain Assessment   Currently in Pain?  No/denies          Social History   Tobacco Use  Smoking Status Former Smoker  Smokeless Tobacco Never Used  Tobacco Comment   Quit 30 years ago    Goals Met:  Independence with exercise equipment Exercise tolerated well No report of cardiac concerns or symptoms Strength training completed today  Goals Unmet:  Not Applicable  Comments: Pt able to follow exercise prescription today without complaint.  Will continue to monitor for progression.    Dr. Emily Filbert is Medical Director for [place] and [facility].

## 2019-10-15 ENCOUNTER — Encounter: Payer: Medicare Other | Attending: Internal Medicine

## 2019-10-15 ENCOUNTER — Telehealth: Payer: Self-pay | Admitting: Internal Medicine

## 2019-10-15 DIAGNOSIS — Z87891 Personal history of nicotine dependence: Secondary | ICD-10-CM | POA: Insufficient documentation

## 2019-10-15 DIAGNOSIS — I255 Ischemic cardiomyopathy: Secondary | ICD-10-CM | POA: Insufficient documentation

## 2019-10-15 DIAGNOSIS — Z7982 Long term (current) use of aspirin: Secondary | ICD-10-CM | POA: Insufficient documentation

## 2019-10-15 DIAGNOSIS — I214 Non-ST elevation (NSTEMI) myocardial infarction: Secondary | ICD-10-CM | POA: Insufficient documentation

## 2019-10-15 DIAGNOSIS — Z8673 Personal history of transient ischemic attack (TIA), and cerebral infarction without residual deficits: Secondary | ICD-10-CM | POA: Insufficient documentation

## 2019-10-15 DIAGNOSIS — E785 Hyperlipidemia, unspecified: Secondary | ICD-10-CM | POA: Insufficient documentation

## 2019-10-15 DIAGNOSIS — E119 Type 2 diabetes mellitus without complications: Secondary | ICD-10-CM | POA: Insufficient documentation

## 2019-10-15 DIAGNOSIS — Z7984 Long term (current) use of oral hypoglycemic drugs: Secondary | ICD-10-CM | POA: Insufficient documentation

## 2019-10-15 DIAGNOSIS — Z955 Presence of coronary angioplasty implant and graft: Secondary | ICD-10-CM | POA: Insufficient documentation

## 2019-10-15 DIAGNOSIS — I2510 Atherosclerotic heart disease of native coronary artery without angina pectoris: Secondary | ICD-10-CM | POA: Insufficient documentation

## 2019-10-15 DIAGNOSIS — Z79899 Other long term (current) drug therapy: Secondary | ICD-10-CM | POA: Insufficient documentation

## 2019-10-15 NOTE — Telephone Encounter (Signed)
-----   Message from Donna Harper sent at 10/15/2019  8:30 AM EST ----- Regarding: RE: lipid clinic visit Hello,  I spoke with the patient by phone and she states that she spoke to Dr. Saunders Revel and she is going to be seeing him going forward and she stated that she will not be coming back to [place], [place].  Thanks, Donna Harper  ----- Message ----- From: Fidel Levy, RN Sent: 10/15/2019   7:49 AM EST To: Cv Div Nl Scheduling Subject: lipid clinic visit                             Hello --   This patient is due for a lipid clinic follow up late Dec or Jan with Dr. Debara Pickett   OK for in-office or virtual. Patient preference  Thanks

## 2019-10-16 ENCOUNTER — Encounter: Payer: Self-pay | Admitting: *Deleted

## 2019-10-16 DIAGNOSIS — I214 Non-ST elevation (NSTEMI) myocardial infarction: Secondary | ICD-10-CM

## 2019-10-16 DIAGNOSIS — Z955 Presence of coronary angioplasty implant and graft: Secondary | ICD-10-CM

## 2019-10-16 NOTE — Progress Notes (Signed)
Cardiac Individual Treatment Plan  Patient Details  Name: Donna Harper MRN: [id number] Date of Birth: 1951-06-15 Referring Provider:     Cardiac Rehab from 08/27/2019 in [facility] Cardiac and Pulmonary Rehab  Referring Provider  End      Initial Encounter Date:    Cardiac Rehab from 08/27/2019 in [facility] Cardiac and Pulmonary Rehab  Date  08/27/19      Visit Diagnosis: NSTEMI (non-ST elevation myocardial infarction) [facility])  Status post coronary artery stent placement  Patient's Home Medications on Admission:  Current Outpatient Medications:  .  Alirocumab (PRALUENT) 150 MG/ML SOAJ, Inject 150 mg into the skin every 14 (fourteen) days., Disp: 2 pen, Rfl: 11 .  aspirin 81 MG tablet, Take 81 mg by mouth daily., Disp: , Rfl:  .  cholecalciferol (VITAMIN D) 1000 UNITS tablet, Take 1,000 Units by mouth daily., Disp: , Rfl:  .  clopidogrel (PLAVIX) 75 MG tablet, Take 1 tablet (75 mg total) by mouth daily., Disp: 90 tablet, Rfl: 3 .  diphenhydrAMINE (BENADRYL) 25 MG tablet, Take 25 mg by mouth at bedtime., Disp: , Rfl:  .  glipiZIDE (GLUCOTROL XL) 5 MG 24 hr tablet, Take 5 mg by mouth daily., Disp: , Rfl:  .  losartan (COZAAR) 100 MG tablet, Take 100 mg by mouth daily., Disp: , Rfl:  .  metFORMIN (GLUCOPHAGE) 1000 MG tablet, Take 1,000 mg by mouth 2 (two) times daily with a meal., Disp: , Rfl:  .  metoprolol tartrate (LOPRESSOR) 25 MG tablet, Take 1 tablet (25 mg total) by mouth 2 (two) times daily., Disp: 60 tablet, Rfl: 11 .  Multiple Vitamin (MULTIVITAMIN WITH MINERALS) TABS tablet, Take 1 tablet by mouth daily., Disp: , Rfl:  .  NASONEX 50 MCG/ACT nasal spray, Place 2 sprays into the nose daily. , Disp: , Rfl: 0 .  nitroGLYCERIN (NITROSTAT) 0.4 MG SL tablet, Place 1 tablet (0.4 mg total) under the tongue every 5 (five) minutes as needed for chest pain., Disp: 90 tablet, Rfl: 3 .  nystatin (MYCOSTATIN) 100000 UNIT/ML suspension, Take 5 mLs by mouth as needed., Disp: , Rfl:  .  vitamin E 400  UNIT capsule, Take 400 Units by mouth daily., Disp: , Rfl:   Past Medical History: Past Medical History:  Diagnosis Date  . CAD (coronary artery disease)    a. 07/2019 NSTEMI/PCI: LM 15d, LAD 40p, 50/98m, RI nl, LCX nl, OM1 small, RCA large, 30p, 38m (3.0x12 Resolute Onyx DES), RPDA fills via L->R collats (OM1), EF 45-50%.  [name] GERD (gastroesophageal reflux disease)   . Hyperlipidemia LDL goal <70   . Hypertension   . Ischemic cardiomyopathy    a. 07/30/2019 LV gram: EF 45-50%, basal inf HK; b. 07/31/2019 Echo: EF 55-60%, impaired relaxation, sev basal inf HK. Nl RV fxn.   . Joint pain   . Murmur    a. 07/2019 Echo: no significant valvular abnormalities. Mild AoV thickening.  [name] TIA (transient ischemic attack) 08/2014  . Type II diabetes mellitus (HCC)     Tobacco Use: Social History   Tobacco Use  Smoking Status Former Smoker  Smokeless Tobacco Never Used  Tobacco Comment   Quit 30 years ago    Labs: Recent Airline pilot for ITP Cardiac and Pulmonary Rehab Latest Ref Rng & Units 07/23/2014 07/29/2019 07/30/2019   Cholestrol 0 - 200 mg/dL 403(K) 742(V) -   LDLCALC 0 - 99 mg/dL SEE COMMENT 956(L) -   HDL >40 mg/dL 87(F) 47 -  Trlycerides <150 mg/dL 409(W) 119(J) -   Hemoglobin A1c 4.8 - 5.6 % - - 6.5(H)       Exercise Target Goals: Exercise Program Goal: Individual exercise prescription set using results from initial 6 min walk test and THRR while considering  patient's activity barriers and safety.   Exercise Prescription Goal: Initial exercise prescription builds to 30-45 minutes a day of aerobic activity, 2-3 days per week.  Home exercise guidelines will be given to patient during program as part of exercise prescription that the participant will acknowledge.  Activity Barriers & Risk Stratification: Activity Barriers & Cardiac Risk Stratification - 08/26/19 1412      Activity Barriers & Cardiac Risk Stratification   Activity Barriers  None   Still some  SOB, ? if Brilinta MD aware   Cardiac Risk Stratification  Moderate       6 Minute Walk: 6 Minute Walk    Row Name 08/27/19 1529         6 Minute Walk   Phase  Initial     Distance  1365 feet     Walk Time  6 minutes     # of Rest Breaks  0     MPH  2.6     METS  3.5     RPE  9     Perceived Dyspnea   0     VO2 Peak  12.24     Symptoms  No     Resting HR  83 bpm     Resting BP  120/62     Resting Oxygen Saturation   97 %     Exercise Oxygen Saturation  during 6 min walk  98 %     Max Ex. HR  116 bpm     Max Ex. BP  160/64     2 Minute Post BP  124/64        Oxygen Initial Assessment:   Oxygen Re-Evaluation:   Oxygen Discharge (Final Oxygen Re-Evaluation):   Initial Exercise Prescription: Initial Exercise Prescription - 08/27/19 1500      Date of Initial Exercise RX and Referring Provider   Date  08/27/19    Referring Provider  End      Recumbant Bike   Level  3    RPM  60    Watts  33    Minutes  15    METs  3.5      NuStep   Level  3    SPM  80    Minutes  15    METs  3.5      Arm Ergometer   Level  1    RPM  25    Minutes  15    METs  3.5      Recumbant Elliptical   Level  2    RPM  50    Minutes  15    METs  3.5      REL-XR   Level  3    Speed  50    Minutes  15    METs  3.5      Prescription Details   Frequency (times per week)  3    Duration  Progress to 30 minutes of continuous aerobic without signs/symptoms of physical distress      Intensity   THRR 40-80% of Max Heartrate  110-138    Ratings of Perceived Exertion  11-13    Perceived Dyspnea  0-4  Resistance Training   Training Prescription  Yes    Weight  3 lb    Reps  10-15       Perform Capillary Blood Glucose checks as needed.  Exercise Prescription Changes: Exercise Prescription Changes    Row Name 08/27/19 1500 08/30/19 0700 09/11/19 1400 09/25/19 1100 10/08/19 1400     Response to Exercise   Blood Pressure (Admit)  120/62  114/60  106/64  110/60   124/62   Blood Pressure (Exercise)  160/64  122/64  144/80  138/68  124/54   Blood Pressure (Exit)  124/64  120/64  108/64  112/68  112/56   Heart Rate (Admit)  83 bpm  77 bpm  77 bpm  79 bpm  79 bpm   Heart Rate (Exercise)  116 bpm  96 bpm  95 bpm  109 bpm  90 bpm   Heart Rate (Exit)  86 bpm  75 bpm  81 bpm  71 bpm  75 bpm   Oxygen Saturation (Admit)  97 %  -  -  -  -   Oxygen Saturation (Exit)  98 %  -  -  -  -   Rating of Perceived Exertion (Exercise)  9  13  16  14  13    Perceived Dyspnea (Exercise)  0  -  -  -  -   Symptoms  none  none  none  none  none   Duration  -  Continue with 30 min of aerobic exercise without signs/symptoms of physical distress.  Continue with 30 min of aerobic exercise without signs/symptoms of physical distress.  Continue with 30 min of aerobic exercise without signs/symptoms of physical distress.  Continue with 30 min of aerobic exercise without signs/symptoms of physical distress.   Intensity  -  -  THRR unchanged  THRR unchanged  THRR unchanged     Progression   Progression  -  Continue to progress workloads to maintain intensity without signs/symptoms of physical distress.  Continue to progress workloads to maintain intensity without signs/symptoms of physical distress.  Continue to progress workloads to maintain intensity without signs/symptoms of physical distress.  Continue to progress workloads to maintain intensity without signs/symptoms of physical distress.   Average METs  -  2.1  2.03  1.85  3     Resistance Training   Training Prescription  -  Yes  Yes  Yes  Yes   Weight  -  3 lb  3 lbs  3 lbs  3 lb   Reps  -  10-15  10-15  10-15  10-15     Interval Training   Interval Training  -  -  No  No  No     Recumbant Bike   Level  -  -  3  -  3   Minutes  -  -  15  -  15   METs  -  -  2  -  3.53     NuStep   Level  -  3  3  3  3    SPM  -  80  -  -  -   Minutes  -  15  15  15  15    METs  -  2.1  2.2  2.2  1.9     Arm Ergometer   Level  -  -  1   1  -   Minutes  -  -  15  15  -  METs  -  -  1.9  1.9  -     Recumbant Elliptical   Level  -  2  -  1  -   RPM  -  50  -  -  -   Minutes  -  15  -  15  -   METs  -  -  -  1.5  -     Biostep-RELP   Level  -  -  3  3  3    Minutes  -  -  15  15  30    METs  -  -  2  2  3       Exercise Comments: Exercise Comments    Row Name 08/29/19 1140           Exercise Comments  First full day of exercise!  Patient was oriented to gym and equipment including functions, settings, policies, and procedures.  Patient's individual exercise prescription and treatment plan were reviewed.  All starting workloads were established based on the results of the 6 minute walk test done at initial orientation visit.  The plan for exercise progression was also introduced and progression will be customized based on patient's performance and goals.          Exercise Goals and Review: Exercise Goals    Row Name 08/27/19 1536             Exercise Goals   Increase Physical Activity  Yes       Intervention  Provide advice, education, support and counseling about physical activity/exercise needs.;Develop an individualized exercise prescription for aerobic and resistive training based on initial evaluation findings, risk stratification, comorbidities and participant's personal goals.       Expected Outcomes  Short Term: Attend rehab on a regular basis to increase amount of physical activity.;Long Term: Add in home exercise to make exercise part of routine and to increase amount of physical activity.;Long Term: Exercising regularly at least 3-5 days a week.       Increase Strength and Stamina  Yes       Intervention  Provide advice, education, support and counseling about physical activity/exercise needs.;Develop an individualized exercise prescription for aerobic and resistive training based on initial evaluation findings, risk stratification, comorbidities and participant's personal goals.       Expected Outcomes   Short Term: Increase workloads from initial exercise prescription for resistance, speed, and METs.;Short Term: Perform resistance training exercises routinely during rehab and add in resistance training at home;Long Term: Improve cardiorespiratory fitness, muscular endurance and strength as measured by increased METs and functional capacity ( )       Able to understand and use rate of perceived exertion (RPE) scale  Yes       Intervention  Provide education and explanation on how to use RPE scale       Expected Outcomes  Short Term: Able to use RPE daily in rehab to express subjective intensity level;Long Term:  Able to use RPE to guide intensity level when exercising independently       Able to understand and use Dyspnea scale  Yes       Intervention  Provide education and explanation on how to use Dyspnea scale       Expected Outcomes  Short Term: Able to use Dyspnea scale daily in rehab to express subjective sense of shortness of breath during exertion;Long Term: Able to use Dyspnea scale to guide intensity level when exercising independently  Knowledge and understanding of Target Heart Rate Range (THRR)  Yes       Intervention  Provide education and explanation of THRR including how the numbers were predicted and where they are located for reference       Expected Outcomes  Short Term: Able to state/look up THRR;Short Term: Able to use daily as guideline for intensity in rehab;Long Term: Able to use THRR to govern intensity when exercising independently       Able to check pulse independently  Yes       Intervention  Provide education and demonstration on how to check pulse in carotid and radial arteries.;Review the importance of being able to check your own pulse for safety during independent exercise       Expected Outcomes  Short Term: Able to explain why pulse checking is important during independent exercise;Long Term: Able to check pulse independently and accurately        Understanding of Exercise Prescription  Yes          Exercise Goals Re-Evaluation : Exercise Goals Re-Evaluation    Row Name 08/29/19 1140 09/10/19 1514 09/24/19 1555 10/01/19 1128 10/08/19 1415     Exercise Goal Re-Evaluation   Exercise Goals Review  Increase Physical Activity;Increase Strength and Stamina;Able to understand and use rate of perceived exertion (RPE) scale;Knowledge and understanding of Target Heart Rate Range (THRR);Able to check pulse independently;Understanding of Exercise Prescription  Increase Physical Activity;Increase Strength and Stamina;Understanding of Exercise Prescription  Increase Physical Activity;Increase Strength and Stamina;Understanding of Exercise Prescription  Increase Physical Activity;Increase Strength and Stamina;Able to understand and use rate of perceived exertion (RPE) scale;Knowledge and understanding of Target Heart Rate Range (THRR);Able to check pulse independently;Understanding of Exercise Prescription  Increase Physical Activity;Increase Strength and Stamina;Able to understand and use rate of perceived exertion (RPE) scale;Knowledge and understanding of Target Heart Rate Range (THRR);Able to check pulse independently;Understanding of Exercise Prescription   Comments  Reviewed RPE scale, THR and program prescription with pt today.  Pt voiced understanding and was given a copy of goals to take home.  Afrika is doing well in rehab.  She has really taken to the NuStep.  We will continue to monitor her progress.  Eisley continues to do well in rehab.  She is making strides on the NuStep and it shows!  We will continue to monitor her progression.  Yosselyn plans to do videos from staff and strength work at home.  We reviewed THR,RPE, signs and s/s anduse of NTG.  Linnea is attending consistently.  She will benefit from adding sessions at home   Expected Outcomes  Short: Use RPE daily to regulate intensity. Long: Follow program prescription in THR.  Short: Begin to  increase workloads.  Long: Continue to improve stamina.  Short: Review home exercise guidelines.  Long: Contineu to improve stamina  Short - exercise at home 1-2 days outside program sessions Long - maintain exercise on her own  Short - add structured exercise at home (not just yardwork) Long - incrrease MET level      Discharge Exercise Prescription (Final Exercise Prescription Changes): Exercise Prescription Changes - 10/08/19 1400      Response to Exercise   Blood Pressure (Admit)  124/62    Blood Pressure (Exercise)  124/54    Blood Pressure (Exit)  112/56    Heart Rate (Admit)  79 bpm    Heart Rate (Exercise)  90 bpm    Heart Rate (Exit)  75 bpm    Rating  of Perceived Exertion (Exercise)  13    Symptoms  none    Duration  Continue with 30 min of aerobic exercise without signs/symptoms of physical distress.    Intensity  THRR unchanged      Progression   Progression  Continue to progress workloads to maintain intensity without signs/symptoms of physical distress.    Average METs  3      Resistance Training   Training Prescription  Yes    Weight  3 lb    Reps  10-15      Interval Training   Interval Training  No      Recumbant Bike   Level  3    Minutes  15    METs  3.53      NuStep   Level  3    Minutes  15    METs  1.9      Biostep-RELP   Level  3    Minutes  30    METs  3       Nutrition:  Target Goals: Understanding of nutrition guidelines, daily intake of sodium 1500mg , cholesterol 200mg , calories 30% from fat and 7% or less from saturated fats, daily to have 5 or more servings of fruits and vegetables.  Biometrics: Pre Biometrics - 08/27/19 1537      Pre Biometrics   Height  5' 3.25" (1.607 m)    Weight  151 lb 11.2 oz (68.8 kg)    BMI (Calculated)  26.65    Single Leg Stand  30 seconds        Nutrition Therapy Plan and Nutrition Goals: Nutrition Therapy & Goals - 08/27/19 1524      Nutrition Therapy   Diet  DM, HH, low Na diet    Protein  (specify units)  55g    Fiber  25 grams    Whole Grain Foods  3 servings    Saturated Fats  12 max. grams    Fruits and Vegetables  5 servings/day    Sodium  1.5 grams      Personal Nutrition Goals   Nutrition Goal  ST: continue with current changes pt did not want to make changes LT: not have another heart attack    Comments  Pt reports having cheerios with whole milk, white bread toast with butter. L sandwich with white bread and deli meat. D: chicken, fish, pork chops grilled with salad (raspberry vinegarette). Pt reports not enjoying the changes as she doesn't like to cook meat except red meat and doesn't like salad. Discussed some ways to improve cooking time and preparation, pt was not receptive. Discussed DM, HH friendly eating. Pt reports not wanting to make any changes.      Intervention Plan   Intervention  Prescribe, educate and counsel regarding individualized specific dietary modifications aiming towards targeted core components such as weight, hypertension, lipid management, diabetes, heart failure and other comorbidities.;Nutrition handout(s) given to patient.    Expected Outcomes  Short Term Goal: Understand basic principles of dietary content, such as calories, fat, sodium, cholesterol and nutrients.;Short Term Goal: A plan has been developed with personal nutrition goals set during dietitian appointment.;Long Term Goal: Adherence to prescribed nutrition plan.       Nutrition Assessments: Nutrition Assessments - 08/27/19 1535      MEDFICTS Scores   Pre Score  54       Nutrition Goals Re-Evaluation: Nutrition Goals Re-Evaluation    Row Name 10/15/19 1142  Goals   Nutrition Goal  ST: continue with current changes pt did not want to make changes LT: not have another heart attack       Comment  Continue with current changes       Expected Outcome  ST: continue with current changes pt did not want to make changes LT: not have another heart attack           Nutrition Goals Discharge (Final Nutrition Goals Re-Evaluation): Nutrition Goals Re-Evaluation - 10/15/19 1142      Goals   Nutrition Goal  ST: continue with current changes pt did not want to make changes LT: not have another heart attack    Comment  Continue with current changes    Expected Outcome  ST: continue with current changes pt did not want to make changes LT: not have another heart attack       Psychosocial: Target Goals: Acknowledge presence or absence of significant depression and/or stress, maximize coping skills, provide positive support system. Participant is able to verbalize types and ability to use techniques and skills needed for reducing stress and depression.   Initial Review & Psychosocial Screening: Initial Psych Review & Screening - 10/01/19 1139      Initial Review   Current issues with  Current Stress Concerns    Comments  Cerissa cares for her husband who has MD.  She has granchildren who visit frequently and this helps both of them.       Quality of Life Scores:  Quality of Life - 08/27/19 1539      Quality of Life   Select  Quality of Life      Quality of Life Scores   Health/Function Pre  22.18 %    Socioeconomic Pre  24.5 %    Psych/Spiritual Pre  24 %    Family Pre  26.6 %    GLOBAL Pre  23.73 %      Scores of 19 and below usually indicate a poorer quality of life in these areas.  A difference of  2-3 points is a clinically meaningful difference.  A difference of 2-3 points in the total score of the Quality of Life Index has been associated with significant improvement in overall quality of life, self-image, physical symptoms, and general health in studies assessing change in quality of life.  PHQ-9: Recent Review Flowsheet Data    Depression screen [facility] 2/9 09/24/2019 08/27/2019   Decreased Interest 1 1   Down, Depressed, Hopeless 0 1   PHQ - 2 Score 1 2   Altered sleeping 2 0   Tired, decreased energy 1 2   Change in appetite 0 1    Feeling bad or failure about yourself  0 0   Trouble concentrating 0 0   Moving slowly or fidgety/restless 1 0   Suicidal thoughts 0 0   PHQ-9 Score 5 5   Difficult doing work/chores Not difficult at all Not difficult at all     Interpretation of Total Score  Total Score Depression Severity:  1-4 = Minimal depression, 5-9 = Mild depression, 10-14 = Moderate depression, 15-19 = Moderately severe depression, 20-27 = Severe depression   Psychosocial Evaluation and Intervention:   Psychosocial Re-Evaluation: Psychosocial Re-Evaluation    Row Name 10/01/19 1141             Psychosocial Re-Evaluation   Current issues with  Current Stress Concerns       Comments  Laynee cares for her husband who  has MD.  She has grandchildren who visit almost daily that is an outlet for both of them.       Expected Outcomes  Short - continue self care Long - manage stress long term          Psychosocial Discharge (Final Psychosocial Re-Evaluation): Psychosocial Re-Evaluation - 10/01/19 1141      Psychosocial Re-Evaluation   Current issues with  Current Stress Concerns    Comments  November cares for her husband who has MD.  She has grandchildren who visit almost daily that is an outlet for both of them.    Expected Outcomes  Short - continue self care Long - manage stress long term       Vocational Rehabilitation: Provide vocational rehab assistance to qualifying candidates.   Vocational Rehab Evaluation & Intervention: Vocational Rehab - 08/26/19 1416      Initial Vocational Rehab Evaluation & Intervention   Assessment shows need for Vocational Rehabilitation  No       Education: Education Goals: Education classes will be provided on a variety of topics geared toward better understanding of heart health and risk factor modification. Participant will state understanding/return demonstration of topics presented as noted by education test scores.  Learning Barriers/Preferences: Learning  Barriers/Preferences - 08/26/19 1421      Learning Barriers/Preferences   Learning Barriers  None    Learning Preferences  None       Education Topics:  AED/CPR: - Group verbal and written instruction with the use of models to demonstrate the basic use of the AED with the basic ABC's of resuscitation.   General Nutrition Guidelines/Fats and Fiber: -Group instruction provided by verbal, written material, models and posters to present the general guidelines for heart healthy nutrition. Gives an explanation and review of dietary fats and fiber.   Controlling Sodium/Reading Food Labels: -Group verbal and written material supporting the discussion of sodium use in heart healthy nutrition. Review and explanation with models, verbal and written materials for utilization of the food label.   Exercise Physiology & General Exercise Guidelines: - Group verbal and written instruction with models to review the exercise physiology of the cardiovascular system and associated critical values. Provides general exercise guidelines with specific guidelines to those with heart or lung disease.    Aerobic Exercise & Resistance Training: - Gives group verbal and written instruction on the various components of exercise. Focuses on aerobic and resistive training programs and the benefits of this training and how to safely progress through these programs..   Flexibility, Balance, Mind/Body Relaxation: Provides group verbal/written instruction on the benefits of flexibility and balance training, including mind/body exercise modes such as yoga, pilates and tai chi.  Demonstration and skill practice provided.   Stress and Anxiety: - Provides group verbal and written instruction about the health risks of elevated stress and causes of high stress.  Discuss the correlation between heart/lung disease and anxiety and treatment options. Review healthy ways to manage with stress and anxiety.   Depression: -  Provides group verbal and written instruction on the correlation between heart/lung disease and depressed mood, treatment options, and the stigmas associated with seeking treatment.   Anatomy & Physiology of the Heart: - Group verbal and written instruction and models provide basic cardiac anatomy and physiology, with the coronary electrical and arterial systems. Review of Valvular disease and Heart Failure   Cardiac Procedures: - Group verbal and written instruction to review commonly prescribed medications for heart disease. Reviews the medication, class of  the drug, and side effects. Includes the steps to properly store meds and maintain the prescription regimen. (beta blockers and nitrates)   Cardiac Medications I: - Group verbal and written instruction to review commonly prescribed medications for heart disease. Reviews the medication, class of the drug, and side effects. Includes the steps to properly store meds and maintain the prescription regimen.   Cardiac Medications II: -Group verbal and written instruction to review commonly prescribed medications for heart disease. Reviews the medication, class of the drug, and side effects. (all other drug classes)    Go Sex-Intimacy & Heart Disease, Get SMART - Goal Setting: - Group verbal and written instruction through game format to discuss heart disease and the return to sexual intimacy. Provides group verbal and written material to discuss and apply goal setting through the application of the S.M.A.R.T. Method.   Other Matters of the Heart: - Provides group verbal, written materials and models to describe Stable Angina and Peripheral Artery. Includes description of the disease process and treatment options available to the cardiac patient.   Exercise & Equipment Safety: - Individual verbal instruction and demonstration of equipment use and safety with use of the equipment.   Cardiac Rehab from 08/27/2019 in [facility] Cardiac and Pulmonary  Rehab  Date  08/27/19  Educator  AS  Instruction Review Code  1- Verbalizes Understanding      Infection Prevention: - Provides verbal and written material to individual with discussion of infection control including proper hand washing and proper equipment cleaning during exercise session.   Cardiac Rehab from 08/27/2019 in [facility] Cardiac and Pulmonary Rehab  Date  08/27/19  Educator  AS  Instruction Review Code  1- Verbalizes Understanding      Falls Prevention: - Provides verbal and written material to individual with discussion of falls prevention and safety.   Cardiac Rehab from 08/27/2019 in [facility] Cardiac and Pulmonary Rehab  Date  08/27/19  Educator  AS  Instruction Review Code  1- Verbalizes Understanding      Diabetes: - Individual verbal and written instruction to review signs/symptoms of diabetes, desired ranges of glucose level fasting, after meals and with exercise. Acknowledge that pre and post exercise glucose checks will be done for 3 sessions at entry of program.   Cardiac Rehab from 08/27/2019 in [facility] Cardiac and Pulmonary Rehab  Date  08/27/19  Educator  [facility]  Instruction Review Code  1- Verbalizes Understanding      Know Your Numbers and Risk Factors: -Group verbal and written instruction about important numbers in your health.  Discussion of what are risk factors and how they play a role in the disease process.  Review of Cholesterol, Blood Pressure, Diabetes, and BMI and the role they play in your overall health.   Sleep Hygiene: -Provides group verbal and written instruction about how sleep can affect your health.  Define sleep hygiene, discuss sleep cycles and impact of sleep habits. Review good sleep hygiene tips.    Other: -Provides group and verbal instruction on various topics (see comments)   Knowledge Questionnaire Score: Knowledge Questionnaire Score - 08/27/19 1537      Knowledge Questionnaire Score   Pre Score  24/26       Core  Components/Risk Factors/Patient Goals at Admission: Personal Goals and Risk Factors at Admission - 08/27/19 1542      Core Components/Risk Factors/Patient Goals on Admission    Weight Management  Yes    Intervention  Weight Management: Develop a combined nutrition and exercise program  designed to reach desired caloric intake, while maintaining appropriate intake of nutrient and fiber, sodium and fats, and appropriate energy expenditure required for the weight goal.;Weight Management: Provide education and appropriate resources to help participant work on and attain dietary goals.    Admit Weight  151 lb 11.2 oz (68.8 kg)    Goal Weight: Short Term  150 lb (68 kg)    Goal Weight: Long Term  150 lb (68 kg)    Expected Outcomes  Short Term: Continue to assess and modify interventions until short term weight is achieved;Long Term: Adherence to nutrition and physical activity/exercise program aimed toward attainment of established weight goal;Weight Loss: Understanding of general recommendations for a balanced deficit meal plan, which promotes 1-2 lb weight loss per week and includes a negative energy balance of [phone] kcal/d       Core Components/Risk Factors/Patient Goals Review:  Goals and Risk Factor Review    Row Name 10/01/19 1135             Core Components/Risk Factors/Patient Goals Review   Personal Goals Review  Weight Management/Obesity;Hypertension;Lipids;Diabetes       Review  Vickies BG has been better since she started exercising. Her weight is 148 today.  She is taking meds as directed.          Core Components/Risk Factors/Patient Goals at Discharge (Final Review):  Goals and Risk Factor Review - 10/01/19 1135      Core Components/Risk Factors/Patient Goals Review   Personal Goals Review  Weight Management/Obesity;Hypertension;Lipids;Diabetes    Review  Vickies BG has been better since she started exercising. Her weight is 148 today.  She is taking meds as directed.        ITP Comments: ITP Comments    Row Name 08/26/19 1427 08/27/19 1544 09/18/19 1341 10/16/19 0635     ITP Comments  Virtual Orientation completed today. Has appt tomorrow with RP/RD for evals  and for gym orientation. Documentation of diagnosis can be found in [facility] 8/17.  and nutrition consult completed. ITP created and sent to Dr Hyacinth Meeker for review.  30 day review completed. ITP sent to Dr. Bethann Punches, Medical Director of Cardiac and Pulmonary Rehab. Continue with ITP unless changes are made by physician.  Department closed starting 10/2 until further notice by infection prevention and Health at Work teams for COVID-19.  30 day review completed. Continue with ITP sent to Dr. Bethann Punches, Medical Director of Cardiac and Pulmonary Rehab for review , changes as needed and signature.       Comments:

## 2019-10-17 ENCOUNTER — Other Ambulatory Visit: Payer: Self-pay

## 2019-10-17 ENCOUNTER — Encounter: Payer: Medicare Other | Admitting: *Deleted

## 2019-10-17 DIAGNOSIS — Z8673 Personal history of transient ischemic attack (TIA), and cerebral infarction without residual deficits: Secondary | ICD-10-CM | POA: Diagnosis not present

## 2019-10-17 DIAGNOSIS — Z7982 Long term (current) use of aspirin: Secondary | ICD-10-CM | POA: Diagnosis not present

## 2019-10-17 DIAGNOSIS — Z955 Presence of coronary angioplasty implant and graft: Secondary | ICD-10-CM

## 2019-10-17 DIAGNOSIS — I214 Non-ST elevation (NSTEMI) myocardial infarction: Secondary | ICD-10-CM

## 2019-10-17 DIAGNOSIS — E119 Type 2 diabetes mellitus without complications: Secondary | ICD-10-CM | POA: Diagnosis not present

## 2019-10-17 DIAGNOSIS — I2510 Atherosclerotic heart disease of native coronary artery without angina pectoris: Secondary | ICD-10-CM | POA: Diagnosis not present

## 2019-10-17 DIAGNOSIS — I255 Ischemic cardiomyopathy: Secondary | ICD-10-CM | POA: Diagnosis not present

## 2019-10-17 DIAGNOSIS — Z7984 Long term (current) use of oral hypoglycemic drugs: Secondary | ICD-10-CM | POA: Diagnosis not present

## 2019-10-17 DIAGNOSIS — Z79899 Other long term (current) drug therapy: Secondary | ICD-10-CM | POA: Diagnosis not present

## 2019-10-17 DIAGNOSIS — E785 Hyperlipidemia, unspecified: Secondary | ICD-10-CM | POA: Diagnosis not present

## 2019-10-17 DIAGNOSIS — Z87891 Personal history of nicotine dependence: Secondary | ICD-10-CM | POA: Diagnosis not present

## 2019-10-17 NOTE — Progress Notes (Signed)
Daily Session Note  Patient Details  Name: SEASON ASTACIO MRN: [id number] Date of Birth: 1951-05-23 Referring Provider:     Cardiac Rehab from 08/27/2019 in [facility] Cardiac and Pulmonary Rehab  Referring Provider  End      Encounter Date: 10/17/2019  Check In: Session Check In - 10/17/19 1119      Check-In   Supervising physician immediately available to respond to emergencies  See telemetry face sheet for immediately available ER MD    Location  [facility]-Cardiac & Pulmonary Rehab    Staff Present  Heath Lark, RN, BSN, CCRP;Jeanna Durrell BS, Exercise Physiologist;Amanda Oletta Darter, BA, ACSM CEP, Exercise Physiologist    Virtual Visit  No    Medication changes reported      No    Fall or balance concerns reported     No    Warm-up and Cool-down  Performed on first and last piece of equipment    Resistance Training Performed  Yes    VAD Patient?  No    PAD/SET Patient?  No      Pain Assessment   Currently in Pain?  No/denies          Social History   Tobacco Use  Smoking Status Former Smoker  Smokeless Tobacco Never Used  Tobacco Comment   Quit 30 years ago    Goals Met:  Independence with exercise equipment Exercise tolerated well No report of cardiac concerns or symptoms  Goals Unmet:  Not Applicable  Comments: Pt able to follow exercise prescription today without complaint.  Will continue to monitor for progression.    Dr. Emily Filbert is Medical Director for [place] and [facility].

## 2019-10-29 ENCOUNTER — Encounter: Payer: Medicare Other | Admitting: *Deleted

## 2019-10-29 ENCOUNTER — Other Ambulatory Visit
Admission: RE | Admit: 2019-10-29 | Discharge: 2019-10-29 | Disposition: A | Discharge status: Home / Self Care | Payer: Medicare Other | Source: Ambulatory Visit | Attending: Internal Medicine | Admitting: Internal Medicine

## 2019-10-29 ENCOUNTER — Other Ambulatory Visit: Payer: Self-pay

## 2019-10-29 DIAGNOSIS — I214 Non-ST elevation (NSTEMI) myocardial infarction: Secondary | ICD-10-CM | POA: Diagnosis not present

## 2019-10-29 DIAGNOSIS — E785 Hyperlipidemia, unspecified: Secondary | ICD-10-CM | POA: Insufficient documentation

## 2019-10-29 DIAGNOSIS — I10 Essential (primary) hypertension: Secondary | ICD-10-CM | POA: Diagnosis present

## 2019-10-29 DIAGNOSIS — I2510 Atherosclerotic heart disease of native coronary artery without angina pectoris: Secondary | ICD-10-CM | POA: Diagnosis present

## 2019-10-29 DIAGNOSIS — Z955 Presence of coronary angioplasty implant and graft: Secondary | ICD-10-CM

## 2019-10-29 LAB — LIPID PANEL
Cholesterol: 156 mg/dL (ref 0–200)
HDL: 43 mg/dL (ref >40)
LDL Cholesterol: UNDETERMINED mg/dL (ref 0–99)
Total CHOL/HDL Ratio: 3.6 RATIO
Triglycerides: 429 mg/dL — ABNORMAL HIGH (ref <150)
VLDL: UNDETERMINED mg/dL (ref 0–40)

## 2019-10-29 LAB — COMPREHENSIVE METABOLIC PANEL
ALT: 18 U/L (ref 0–44)
AST: 23 U/L (ref 15–41)
Albumin: 4.3 g/dL (ref 3.5–5.0)
Alkaline Phosphatase: 56 U/L (ref 38–126)
Anion gap: 11 (ref 5–15)
BUN: 22 mg/dL (ref 8–23)
CO2: 23 mmol/L (ref 22–32)
Calcium: 9.8 mg/dL (ref 8.9–10.3)
Chloride: 105 mmol/L (ref 98–111)
Creatinine, Ser: 0.87 mg/dL (ref 0.44–1.00)
GFR calc Af Amer: >60 mL/min (ref >60)
GFR calc non Af Amer: >60 mL/min (ref >60)
Glucose, Bld: 134 mg/dL — ABNORMAL HIGH (ref 70–99)
Potassium: 4.6 mmol/L (ref 3.5–5.1)
Sodium: 139 mmol/L (ref 135–145)
Total Bilirubin: 0.9 mg/dL (ref 0.3–1.2)
Total Protein: 7.2 g/dL (ref 6.5–8.1)

## 2019-10-29 LAB — LDL CHOLESTEROL, DIRECT: Direct LDL: 43.5 mg/dL (ref 0–99)

## 2019-10-29 NOTE — Progress Notes (Signed)
Daily Session Note  Patient Details  Name: Donna Harper MRN: [id number] Date of Birth: 1951-01-14 Referring Provider:     Cardiac Rehab from 08/27/2019 in [facility] Cardiac and Pulmonary Rehab  Referring Provider  End      Encounter Date: 10/29/2019  Check In: Session Check In - 10/29/19 1135      Check-In   Supervising physician immediately available to respond to emergencies  See telemetry face sheet for immediately available ER MD    Location  [facility]-Cardiac & Pulmonary Rehab    Staff Present  Heath Lark, RN, BSN, CCRP;Amanda Sommer, BA, ACSM CEP, Exercise Physiologist;[name] Caiola RDN, LDN    Medication changes reported      No    Fall or balance concerns reported     No    Warm-up and Cool-down  Performed on first and last piece of equipment    Resistance Training Performed  Yes    VAD Patient?  No    PAD/SET Patient?  No      Pain Assessment   Currently in Pain?  No/denies    Multiple Pain Sites  No          Social History   Tobacco Use  Smoking Status Former Smoker  Smokeless Tobacco Never Used  Tobacco Comment   Quit 30 years ago    Goals Met:  Independence with exercise equipment Exercise tolerated well No report of cardiac concerns or symptoms  Goals Unmet:  Not Applicable  Comments: Pt able to follow exercise prescription today without complaint.  Will continue to monitor for progression.    Dr. Emily Filbert is Medical Director for [place] and [facility].

## 2019-10-31 ENCOUNTER — Encounter: Payer: Medicare Other | Admitting: *Deleted

## 2019-10-31 ENCOUNTER — Other Ambulatory Visit: Payer: Self-pay

## 2019-10-31 DIAGNOSIS — Z955 Presence of coronary angioplasty implant and graft: Secondary | ICD-10-CM

## 2019-10-31 DIAGNOSIS — I214 Non-ST elevation (NSTEMI) myocardial infarction: Secondary | ICD-10-CM | POA: Diagnosis not present

## 2019-10-31 NOTE — Progress Notes (Signed)
Daily Session Note  Patient Details  Name: Donna Harper MRN: [id number] Date of Birth: 06/16/1951 Referring Provider:     Cardiac Rehab from 08/27/2019 in [facility] Cardiac and Pulmonary Rehab  Referring Provider  End      Encounter Date: 10/31/2019  Check In: Session Check In - 10/31/19 1113      Check-In   Supervising physician immediately available to respond to emergencies  See telemetry face sheet for immediately available ER MD    Location  [facility]-Cardiac & Pulmonary Rehab    Staff Present  Renita Papa, RN BSN;Laureen Owens Shark, BS, RRT, CPFT;Jeanna Durrell BS, Exercise Physiologist    Virtual Visit  No    Medication changes reported      No    Fall or balance concerns reported     No    Warm-up and Cool-down  Performed on first and last piece of equipment    Resistance Training Performed  Yes    VAD Patient?  No    PAD/SET Patient?  No      Pain Assessment   Currently in Pain?  No/denies          Social History   Tobacco Use  Smoking Status Former Smoker  Smokeless Tobacco Never Used  Tobacco Comment   Quit 30 years ago    Goals Met:  Independence with exercise equipment Exercise tolerated well No report of cardiac concerns or symptoms Strength training completed today  Goals Unmet:  Not Applicable  Comments: Pt able to follow exercise prescription today without complaint.  Will continue to monitor for progression.    Dr. Emily Filbert is Medical Director for [place] and [facility].

## 2019-11-01 ENCOUNTER — Encounter: Payer: Self-pay | Admitting: Internal Medicine

## 2019-11-01 ENCOUNTER — Ambulatory Visit (INDEPENDENT_AMBULATORY_CARE_PROVIDER_SITE_OTHER): Payer: Medicare Other | Admitting: Cardiovascular Disease

## 2019-11-01 ENCOUNTER — Ambulatory Visit: Payer: Medicare Other | Admitting: Internal Medicine

## 2019-11-01 VITALS — BP 110/60 | HR 78 | Ht 63.0 in | Wt 151.0 lb

## 2019-11-01 DIAGNOSIS — R0602 Shortness of breath: Secondary | ICD-10-CM | POA: Diagnosis not present

## 2019-11-01 DIAGNOSIS — I255 Ischemic cardiomyopathy: Secondary | ICD-10-CM | POA: Diagnosis not present

## 2019-11-01 DIAGNOSIS — I200 Unstable angina: Secondary | ICD-10-CM | POA: Diagnosis not present

## 2019-11-01 DIAGNOSIS — E785 Hyperlipidemia, unspecified: Secondary | ICD-10-CM

## 2019-11-01 DIAGNOSIS — I2510 Atherosclerotic heart disease of native coronary artery without angina pectoris: Secondary | ICD-10-CM | POA: Diagnosis not present

## 2019-11-01 DIAGNOSIS — I10 Essential (primary) hypertension: Secondary | ICD-10-CM

## 2019-11-01 DIAGNOSIS — E119 Type 2 diabetes mellitus without complications: Secondary | ICD-10-CM

## 2019-11-01 NOTE — Progress Notes (Signed)
Follow-up Outpatient Visit Date: 11/01/2019  Primary Care Provider: Cletis Athens, MD [address] [place] [address]  Chief Complaint:   HPI:  Donna Harper is a 68 y.o. female with history of  coronary artery disease status post PCI to the RCA in the setting of NSTEMI (07/2019), hypertension,  hyperlipidemia,  type 2 diabetes mellitus,  TIA,   GERD,  who presents for follow-up of shortness of breath, CAD  a month ago, she complained of significant dyspnea when bending over or "rushing."    dull ache in the left upper chest that would come and go, lasting about 30 seconds at a time.  The pain was not reminiscent of what she experienced at the time of her NSTEMI in August.    We agreed to transition from ticagrelor to clopidogrel to see if this would help improve her symptoms.  Going to heart track  Husband with MS Walks with assistance Has falls,   She is doing cardiac rehab, enjoying it, feels better Concerned she would not be able to continue at this time given she does not have anyone that can sit with her husband while she goes to rehab  LDL 143, now in the 40s Total chol 260 to 150  Cramps on fish oil  EKG personally reviewed by myself on todays visit Shows normal sinus rhythm with rate 78 bpm no significant ST or T wave changes   -------------------------------------------------------------------------------------------  Past Medical History:  Diagnosis Date  . CAD (coronary artery disease)    a. 07/2019 NSTEMI/PCI: LM 15d, LAD 40p, 50/62m, RI nl, LCX nl, OM1 small, RCA large, 30p, 15m (3.0x12 Resolute Onyx DES), RPDA fills via L->R collats (OM1), EF 45-50%.  [name] GERD (gastroesophageal reflux disease)   . Hyperlipidemia LDL goal <70   . Hypertension   . Ischemic cardiomyopathy    a. 07/30/2019 LV gram: EF 45-50%, basal inf HK; b. 07/31/2019 Echo: EF 55-60%, impaired relaxation, sev basal inf HK. Nl RV fxn.   . Joint pain   . Murmur    a. 07/2019 Echo:  no significant valvular abnormalities. Mild AoV thickening.  [name] TIA (transient ischemic attack) 08/2014  . Type II diabetes mellitus ([place])    Past Surgical History:  Procedure Laterality Date  . BREAST BIOPSY Bilateral 1988   benign/Dr Jamal Collin  . CESAREAN SECTION  1986  . CORONARY STENT INTERVENTION N/A 07/30/2019   Procedure: CORONARY STENT INTERVENTION;  Surgeon: Nelva Bush, MD;  Location: [place] [facility];  Service: Cardiovascular;  Laterality: N/A;  RCA  . LEFT HEART CATH AND CORONARY ANGIOGRAPHY N/A 07/30/2019   Procedure: LEFT HEART CATH AND CORONARY ANGIOGRAPHY;  Surgeon: Nelva Bush, MD;  Location: [place] [facility];  Service: Cardiovascular;  Laterality: N/A;    Current Meds  Medication Sig  . Alirocumab (PRALUENT) 150 MG/ML SOAJ Inject 150 mg into the skin every 14 (fourteen) days.  [name] aspirin 81 MG tablet Take 81 mg by mouth daily.  . cholecalciferol (VITAMIN D) 1000 UNITS tablet Take 1,000 Units by mouth daily.  . clopidogrel (PLAVIX) 75 MG tablet Take 1 tablet (75 mg total) by mouth daily.  . diphenhydrAMINE (BENADRYL) 25 MG tablet Take 25 mg by mouth at bedtime.  [name] glipiZIDE (GLUCOTROL XL) 5 MG 24 hr tablet Take 5 mg by mouth daily.  [name] losartan (COZAAR) 100 MG tablet Take 100 mg by mouth daily.  . metFORMIN (GLUCOPHAGE) 1000 MG tablet Take 1,000 mg by mouth 2 (two) times daily with a  meal.  . metoprolol tartrate (LOPRESSOR) 25 MG tablet Take 1 tablet (25 mg total) by mouth 2 (two) times daily.  . Multiple Vitamin (MULTIVITAMIN WITH MINERALS) TABS tablet Take 1 tablet by mouth daily.  [name] NASONEX 50 MCG/ACT nasal spray Place 2 sprays into the nose daily.   . nitroGLYCERIN (NITROSTAT) 0.4 MG SL tablet Place 1 tablet (0.4 mg total) under the tongue every 5 (five) minutes as needed for chest pain.  [name] nystatin (MYCOSTATIN) 100000 UNIT/ML suspension Take 5 mLs by mouth as needed.  . vitamin E 400 UNIT capsule Take 400 Units by mouth daily.    Allergies:  Atorvastatin, Erythromycin, Levaquin [levofloxacin in d5w], Penicillins, Rosuvastatin, Simvastatin, Tequin [gatifloxacin], Zoloft [sertraline hcl], and Zithromax [azithromycin]      Social History   Tobacco Use  . Smoking status: Former Research scientist (life sciences)  . Smokeless tobacco: Never Used  . Tobacco comment: Quit 30 years ago  Substance Use Topics  . Alcohol use: No    Alcohol/week: 0.0 standard drinks  . Drug use: No    Family History  Problem Relation Age of Onset  . COPD Mother   . COPD Father     Review of Systems: A 12-system review of systems was performed and was negative except as noted in the HPI. Review of Systems  Constitutional: Negative.   HENT: Negative.   Respiratory: Negative.   Cardiovascular: Negative.   Gastrointestinal: Negative.   Musculoskeletal: Negative.   Neurological: Negative.   Psychiatric/Behavioral: Negative.   All other systems reviewed and are negative.    --------------------------------------------------------------------------------------------------  Physical Exam: BP 110/60 (BP Location: Left Arm, Patient Position: Sitting, Cuff Size: Normal)   Pulse 78   Ht 5\' 3"  (1.6 m)   Wt 151 lb (68.5 kg)   SpO2 98%   BMI 26.75 kg/m   Constitutional:  oriented to person, place, and time. No distress.  HENT:  Head: Grossly normal Eyes:  no discharge. No scleral icterus.  Neck: No JVD, no carotid bruits  Cardiovascular: Regular rate and rhythm, no murmurs appreciated Pulmonary/Chest: Clear to auscultation bilaterally, no wheezes or rails Abdominal: Soft.  no distension.  no tenderness.  Musculoskeletal: Normal range of motion Neurological:  normal muscle tone. Coordination normal. No atrophy Skin: Skin warm and dry Psychiatric: normal affect, pleasant    Lab Results  Component Value Date   WBC 5.9 07/31/2019   HGB 11.2 (L) 07/31/2019   HCT 32.5 (L) 07/31/2019   MCV 87.6 07/31/2019   PLT 166 07/31/2019    Lab Results  Component Value  Date   NA 139 10/29/2019   K 4.6 10/29/2019   CL 105 10/29/2019   CO2 23 10/29/2019   BUN 22 10/29/2019   CREATININE 0.87 10/29/2019   GLUCOSE 134 (H) 10/29/2019   ALT 18 10/29/2019    Lab Results  Component Value Date   CHOL 156 10/29/2019   HDL 43 10/29/2019   LDLCALC UNABLE TO CALCULATE IF TRIGLYCERIDE OVER 400 mg/dL 10/29/2019   LDLDIRECT 43.5 10/29/2019   TRIG 429 (H) 10/29/2019   CHOLHDL 3.6 10/29/2019    --------------------------------------------------------------------------------------------------  Syenna was seen today for other.  Diagnoses and all orders for this visit:  Ischemic cardiomyopathy  Coronary artery disease involving native coronary artery of native heart without angina pectoris  Shortness of breath  Hyperlipidemia LDL goal <70  Essential hypertension  Type 2 diabetes mellitus without complication, without long-term current use of insulin ([place])   She is non-smoker, cholesterol now at goal on Repatha Numbers  discussed with her in detail, drop in cholesterol 100 points She denies any chest pain concerning for angina No further testing ordered Dietary guide provided, discussed the need for walking program and weight loss  Discussed her stressors at home, taking care of her husband who has MS. given severe stressors she may need to take a break from cardiac rehab (unable to get someone to stay with her husband).  Shortness of breath dramatically improved changing Brilinta to Plavix    Total encounter time more than 25 minutes  Greater than 50% was spent in counseling and coordination of care with the patient  Follow-up with Dr. Saunders Revel 6 months   Signed, Esmond Plants, MD, Ph.D [facility]

## 2019-11-01 NOTE — Patient Instructions (Signed)
Medication Instructions:  No changes  If you need a refill on your cardiac medications before your next appointment, please call your pharmacy.    Lab work: No new labs needed   If you have labs (blood work) drawn today and your tests are completely normal, you will receive your results only by: [name] MyChart Message (if you have MyChart) OR . A paper copy in the mail If you have any lab test that is abnormal or we need to change your treatment, we will call you to review the results.   Testing/Procedures: No new testing needed   Follow-Up: At [facility], you and your health needs are our priority.  As part of our continuing mission to provide you with exceptional heart care, we have created designated Provider Care Teams.  These Care Teams include your primary Cardiologist (physician) and Advanced Practice Providers (APPs -  Physician Assistants and Nurse Practitioners) who all work together to provide you with the care you need, when you need it.  . You will need a follow up appointment in 6 months with Dr. Saunders Revel   . Providers on your designated Care Team:   . Murray Hodgkins, NP . Christell Faith, PA-C . Marrianne Mood, PA-C  Any Other Special Instructions Will Be Listed Below (If Applicable).  For educational health videos Log in to : [email or web address] Or : [email or web address], password : [email or web address]

## 2019-11-05 ENCOUNTER — Encounter: Payer: Medicare Other | Admitting: *Deleted

## 2019-11-05 ENCOUNTER — Other Ambulatory Visit: Payer: Self-pay

## 2019-11-05 DIAGNOSIS — I214 Non-ST elevation (NSTEMI) myocardial infarction: Secondary | ICD-10-CM

## 2019-11-05 DIAGNOSIS — Z955 Presence of coronary angioplasty implant and graft: Secondary | ICD-10-CM

## 2019-11-05 NOTE — Progress Notes (Signed)
Cardiac Individual Treatment Plan  Patient Details  Name: LAROYCE WUJCIK MRN: [id number] Date of Birth: 02/26/1951 Referring Provider:     Cardiac Rehab from 08/27/2019 in [facility] Cardiac and Pulmonary Rehab  Referring Provider  End      Initial Encounter Date:    Cardiac Rehab from 08/27/2019 in [facility] Cardiac and Pulmonary Rehab  Date  08/27/19      Visit Diagnosis: NSTEMI (non-ST elevation myocardial infarction) [facility])  Status post coronary artery stent placement  Patient's Home Medications on Admission:  Current Outpatient Medications:  .  Alirocumab (PRALUENT) 150 MG/ML SOAJ, Inject 150 mg into the skin every 14 (fourteen) days., Disp: 2 pen, Rfl: 11 .  aspirin 81 MG tablet, Take 81 mg by mouth daily., Disp: , Rfl:  .  cholecalciferol (VITAMIN D) 1000 UNITS tablet, Take 1,000 Units by mouth daily., Disp: , Rfl:  .  clopidogrel (PLAVIX) 75 MG tablet, Take 1 tablet (75 mg total) by mouth daily., Disp: 90 tablet, Rfl: 3 .  diphenhydrAMINE (BENADRYL) 25 MG tablet, Take 25 mg by mouth at bedtime., Disp: , Rfl:  .  glipiZIDE (GLUCOTROL XL) 5 MG 24 hr tablet, Take 5 mg by mouth daily., Disp: , Rfl:  .  losartan (COZAAR) 100 MG tablet, Take 100 mg by mouth daily., Disp: , Rfl:  .  metFORMIN (GLUCOPHAGE) 1000 MG tablet, Take 1,000 mg by mouth 2 (two) times daily with a meal., Disp: , Rfl:  .  metoprolol tartrate (LOPRESSOR) 25 MG tablet, Take 1 tablet (25 mg total) by mouth 2 (two) times daily., Disp: 60 tablet, Rfl: 11 .  Multiple Vitamin (MULTIVITAMIN WITH MINERALS) TABS tablet, Take 1 tablet by mouth daily., Disp: , Rfl:  .  NASONEX 50 MCG/ACT nasal spray, Place 2 sprays into the nose daily. , Disp: , Rfl: 0 .  nitroGLYCERIN (NITROSTAT) 0.4 MG SL tablet, Place 1 tablet (0.4 mg total) under the tongue every 5 (five) minutes as needed for chest pain., Disp: 90 tablet, Rfl: 3 .  nystatin (MYCOSTATIN) 100000 UNIT/ML suspension, Take 5 mLs by mouth as needed., Disp: , Rfl:  .  vitamin E 400  UNIT capsule, Take 400 Units by mouth daily., Disp: , Rfl:   Past Medical History: Past Medical History:  Diagnosis Date  . CAD (coronary artery disease)    a. 07/2019 NSTEMI/PCI: LM 15d, LAD 40p, 50/31m, RI nl, LCX nl, OM1 small, RCA large, 30p, 41m (3.0x12 Resolute Onyx DES), RPDA fills via L->R collats (OM1), EF 45-50%.  [name] GERD (gastroesophageal reflux disease)   . Hyperlipidemia LDL goal <70   . Hypertension   . Ischemic cardiomyopathy    a. 07/30/2019 LV gram: EF 45-50%, basal inf HK; b. 07/31/2019 Echo: EF 55-60%, impaired relaxation, sev basal inf HK. Nl RV fxn.   . Joint pain   . Murmur    a. 07/2019 Echo: no significant valvular abnormalities. Mild AoV thickening.  [name] TIA (transient ischemic attack) 08/2014  . Type II diabetes mellitus (HCC)     Tobacco Use: Social History   Tobacco Use  Smoking Status Former Smoker  Smokeless Tobacco Never Used  Tobacco Comment   Quit 30 years ago    Labs: Recent Hydrographic surveyor    Labs for ITP Cardiac and Pulmonary Rehab Latest Ref Rng & Units 07/23/2014 07/29/2019 07/30/2019 10/29/2019   Cholestrol 0 - 200 mg/dL 696(E) 952(W) - 413   LDLCALC 0 - 99 mg/dL SEE COMMENT 244(W) - UNABLE TO CALCULATE IF TRIGLYCERIDE OVER 400  mg/dL   LDLDIRECT 0 - 99 mg/dL - - - 95.6   HDL >38 mg/dL 75(I) 47 - 43   Trlycerides <150 mg/dL 433(I) 951(O) - 841(Y)   Hemoglobin A1c 4.8 - 5.6 % - - 6.5(H) -       Exercise Target Goals: Exercise Program Goal: Individual exercise prescription set using results from initial 6 min walk test and THRR while considering  patient's activity barriers and safety.   Exercise Prescription Goal: Initial exercise prescription builds to 30-45 minutes a day of aerobic activity, 2-3 days per week.  Home exercise guidelines will be given to patient during program as part of exercise prescription that the participant will acknowledge.  Activity Barriers & Risk Stratification: Activity Barriers & Cardiac Risk  Stratification - 08/26/19 1412      Activity Barriers & Cardiac Risk Stratification   Activity Barriers  None   Still some SOB, ? if Brilinta MD aware   Cardiac Risk Stratification  Moderate       6 Minute Walk: 6 Minute Walk    Row Name 08/27/19 1529         6 Minute Walk   Phase  Initial     Distance  1365 feet     Walk Time  6 minutes     # of Rest Breaks  0     MPH  2.6     METS  3.5     RPE  9     Perceived Dyspnea   0     VO2 Peak  12.24     Symptoms  No     Resting HR  83 bpm     Resting BP  120/62     Resting Oxygen Saturation   97 %     Exercise Oxygen Saturation  during 6 min walk  98 %     Max Ex. HR  116 bpm     Max Ex. BP  160/64     2 Minute Post BP  124/64        Oxygen Initial Assessment:   Oxygen Re-Evaluation:   Oxygen Discharge (Final Oxygen Re-Evaluation):   Initial Exercise Prescription: Initial Exercise Prescription - 08/27/19 1500      Date of Initial Exercise RX and Referring Provider   Date  08/27/19    Referring Provider  End      Recumbant Bike   Level  3    RPM  60    Watts  33    Minutes  15    METs  3.5      NuStep   Level  3    SPM  80    Minutes  15    METs  3.5      Arm Ergometer   Level  1    RPM  25    Minutes  15    METs  3.5      Recumbant Elliptical   Level  2    RPM  50    Minutes  15    METs  3.5      REL-XR   Level  3    Speed  50    Minutes  15    METs  3.5      Prescription Details   Frequency (times per week)  3    Duration  Progress to 30 minutes of continuous aerobic without signs/symptoms of physical distress      Intensity   THRR 40-80%  of Max Heartrate  110-138    Ratings of Perceived Exertion  11-13    Perceived Dyspnea  0-4      Resistance Training   Training Prescription  Yes    Weight  3 lb    Reps  10-15       Perform Capillary Blood Glucose checks as needed.  Exercise Prescription Changes: Exercise Prescription Changes    Row Name 08/27/19 1500 08/30/19 0700  09/11/19 1400 09/25/19 1100 10/08/19 1400     Response to Exercise   Blood Pressure (Admit)  120/62  114/60  106/64  110/60  124/62   Blood Pressure (Exercise)  160/64  122/64  144/80  138/68  124/54   Blood Pressure (Exit)  124/64  120/64  108/64  112/68  112/56   Heart Rate (Admit)  83 bpm  77 bpm  77 bpm  79 bpm  79 bpm   Heart Rate (Exercise)  116 bpm  96 bpm  95 bpm  109 bpm  90 bpm   Heart Rate (Exit)  86 bpm  75 bpm  81 bpm  71 bpm  75 bpm   Oxygen Saturation (Admit)  97 %  -  -  -  -   Oxygen Saturation (Exit)  98 %  -  -  -  -   Rating of Perceived Exertion (Exercise)  9  13  16  14  13    Perceived Dyspnea (Exercise)  0  -  -  -  -   Symptoms  none  none  none  none  none   Duration  -  Continue with 30 min of aerobic exercise without signs/symptoms of physical distress.  Continue with 30 min of aerobic exercise without signs/symptoms of physical distress.  Continue with 30 min of aerobic exercise without signs/symptoms of physical distress.  Continue with 30 min of aerobic exercise without signs/symptoms of physical distress.   Intensity  -  -  THRR unchanged  THRR unchanged  THRR unchanged     Progression   Progression  -  Continue to progress workloads to maintain intensity without signs/symptoms of physical distress.  Continue to progress workloads to maintain intensity without signs/symptoms of physical distress.  Continue to progress workloads to maintain intensity without signs/symptoms of physical distress.  Continue to progress workloads to maintain intensity without signs/symptoms of physical distress.   Average METs  -  2.1  2.03  1.85  3     Resistance Training   Training Prescription  -  Yes  Yes  Yes  Yes   Weight  -  3 lb  3 lbs  3 lbs  3 lb   Reps  -  10-15  10-15  10-15  10-15     Interval Training   Interval Training  -  -  No  No  No     Recumbant Bike   Level  -  -  3  -  3   Minutes  -  -  15  -  15   METs  -  -  2  -  3.53     NuStep   Level  -  3  3   3  3    SPM  -  80  -  -  -   Minutes  -  15  15  15  15    METs  -  2.1  2.2  2.2  1.9     Arm Ergometer  Level  -  -  1  1  -   Minutes  -  -  15  15  -   METs  -  -  1.9  1.9  -     Recumbant Elliptical   Level  -  2  -  1  -   RPM  -  50  -  -  -   Minutes  -  15  -  15  -   METs  -  -  -  1.5  -     Biostep-RELP   Level  -  -  3  3  3    Minutes  -  -  15  15  30    METs  -  -  2  2  3    Row Name 10/23/19 1200             Response to Exercise   Blood Pressure (Admit)  120/60       Blood Pressure (Exercise)  120/70       Blood Pressure (Exit)  110/60       Heart Rate (Admit)  85 bpm       Heart Rate (Exercise)  90 bpm       Heart Rate (Exit)  71 bpm       Rating of Perceived Exertion (Exercise)  13       Symptoms  none       Duration  Continue with 30 min of aerobic exercise without signs/symptoms of physical distress.       Intensity  THRR unchanged         Progression   Progression  Continue to progress workloads to maintain intensity without signs/symptoms of physical distress.       Average METs  2.13         Resistance Training   Training Prescription  Yes       Weight  3 lb       Reps  10-15         Interval Training   Interval Training  No         Recumbant Bike   Level  3       Minutes  15         NuStep   Level  3       Minutes  15       METs  1.9         REL-XR   Level  3       Minutes  15       METs  1.5         Biostep-RELP   Level  3       Minutes  30       METs  3         Home Exercise Plan   Plans to continue exercise at  Home (comment) walking       Frequency  Add 2 additional days to program exercise sessions.       Initial Home Exercises Provided  10/01/19          Exercise Comments: Exercise Comments    Row Name 08/29/19 1140 11/05/19 1120         Exercise Comments  First full day of exercise!  Patient was oriented to gym and equipment including functions, settings, policies, and procedures.  Patient's individual  exercise prescription and treatment plan were reviewed.  All starting workloads  were established based on the results of the 6 minute walk test done at initial orientation visit.  The plan for exercise progression was also introduced and progression will be customized based on patient's performance and goals.  Alexya graduated today from  rehab with 15 sessions completed.  Details of the patient's exercise prescription and what She needs to do in order to continue the prescription and progress were discussed with patient.  Patient was given a copy of prescription and goals.  Patient verbalized understanding.  Nikoleta plans to continue to exercise at home         Exercise Goals and Review: Exercise Goals    Row Name 08/27/19 1536             Exercise Goals   Increase Physical Activity  Yes       Intervention  Provide advice, education, support and counseling about physical activity/exercise needs.;Develop an individualized exercise prescription for aerobic and resistive training based on initial evaluation findings, risk stratification, comorbidities and participant's personal goals.       Expected Outcomes  Short Term: Attend rehab on a regular basis to increase amount of physical activity.;Long Term: Add in home exercise to make exercise part of routine and to increase amount of physical activity.;Long Term: Exercising regularly at least 3-5 days a week.       Increase Strength and Stamina  Yes       Intervention  Provide advice, education, support and counseling about physical activity/exercise needs.;Develop an individualized exercise prescription for aerobic and resistive training based on initial evaluation findings, risk stratification, comorbidities and participant's personal goals.       Expected Outcomes  Short Term: Increase workloads from initial exercise prescription for resistance, speed, and METs.;Short Term: Perform resistance training exercises routinely during rehab and add in  resistance training at home;Long Term: Improve cardiorespiratory fitness, muscular endurance and strength as measured by increased METs and functional capacity ( )       Able to understand and use rate of perceived exertion (RPE) scale  Yes       Intervention  Provide education and explanation on how to use RPE scale       Expected Outcomes  Short Term: Able to use RPE daily in rehab to express subjective intensity level;Long Term:  Able to use RPE to guide intensity level when exercising independently       Able to understand and use Dyspnea scale  Yes       Intervention  Provide education and explanation on how to use Dyspnea scale       Expected Outcomes  Short Term: Able to use Dyspnea scale daily in rehab to express subjective sense of shortness of breath during exertion;Long Term: Able to use Dyspnea scale to guide intensity level when exercising independently       Knowledge and understanding of Target Heart Rate Range (THRR)  Yes       Intervention  Provide education and explanation of THRR including how the numbers were predicted and where they are located for reference       Expected Outcomes  Short Term: Able to state/look up THRR;Short Term: Able to use daily as guideline for intensity in rehab;Long Term: Able to use THRR to govern intensity when exercising independently       Able to check pulse independently  Yes       Intervention  Provide education and demonstration on how to check pulse in carotid and radial arteries.;Review the importance  of being able to check your own pulse for safety during independent exercise       Expected Outcomes  Short Term: Able to explain why pulse checking is important during independent exercise;Long Term: Able to check pulse independently and accurately       Understanding of Exercise Prescription  Yes          Exercise Goals Re-Evaluation : Exercise Goals Re-Evaluation    Row Name 08/29/19 1140 09/10/19 1514 09/24/19 1555 10/01/19 1128 10/08/19  1415     Exercise Goal Re-Evaluation   Exercise Goals Review  Increase Physical Activity;Increase Strength and Stamina;Able to understand and use rate of perceived exertion (RPE) scale;Knowledge and understanding of Target Heart Rate Range (THRR);Able to check pulse independently;Understanding of Exercise Prescription  Increase Physical Activity;Increase Strength and Stamina;Understanding of Exercise Prescription  Increase Physical Activity;Increase Strength and Stamina;Understanding of Exercise Prescription  Increase Physical Activity;Increase Strength and Stamina;Able to understand and use rate of perceived exertion (RPE) scale;Knowledge and understanding of Target Heart Rate Range (THRR);Able to check pulse independently;Understanding of Exercise Prescription  Increase Physical Activity;Increase Strength and Stamina;Able to understand and use rate of perceived exertion (RPE) scale;Knowledge and understanding of Target Heart Rate Range (THRR);Able to check pulse independently;Understanding of Exercise Prescription   Comments  Reviewed RPE scale, THR and program prescription with pt today.  Pt voiced understanding and was given a copy of goals to take home.  Kloe is doing well in rehab.  She has really taken to the NuStep.  We will continue to monitor her progress.  Emmogene continues to do well in rehab.  She is making strides on the NuStep and it shows!  We will continue to monitor her progression.  Ashya plans to do videos from staff and strength work at home.  We reviewed THR,RPE, signs and s/s anduse of NTG.  Lizza is attending consistently.  She will benefit from adding sessions at home   Expected Outcomes  Short: Use RPE daily to regulate intensity. Long: Follow program prescription in THR.  Short: Begin to increase workloads.  Long: Continue to improve stamina.  Short: Review home exercise guidelines.  Long: Contineu to improve stamina  Short - exercise at home 1-2 days outside program sessions Long  - maintain exercise on her own  Short - add structured exercise at home (not just yardwork) Long - incrrease MET level   Row Name 10/23/19 1219             Exercise Goal Re-Evaluation   Exercise Goals Review  Increase Physical Activity;Increase Strength and Stamina;Understanding of Exercise Prescription       Comments  Katherine has been doing well in rehab.  she is up to level 3 across the board.  We will continue to boost workloads and monitor risk factors.       Expected Outcomes  Short: Increase workloads.  Long: Continue to walk more at home.          Discharge Exercise Prescription (Final Exercise Prescription Changes): Exercise Prescription Changes - 10/23/19 1200      Response to Exercise   Blood Pressure (Admit)  120/60    Blood Pressure (Exercise)  120/70    Blood Pressure (Exit)  110/60    Heart Rate (Admit)  85 bpm    Heart Rate (Exercise)  90 bpm    Heart Rate (Exit)  71 bpm    Rating of Perceived Exertion (Exercise)  13    Symptoms  none    Duration  Continue with 30 min of aerobic exercise without signs/symptoms of physical distress.    Intensity  THRR unchanged      Progression   Progression  Continue to progress workloads to maintain intensity without signs/symptoms of physical distress.    Average METs  2.13      Resistance Training   Training Prescription  Yes    Weight  3 lb    Reps  10-15      Interval Training   Interval Training  No      Recumbant Bike   Level  3    Minutes  15      NuStep   Level  3    Minutes  15    METs  1.9      REL-XR   Level  3    Minutes  15    METs  1.5      Biostep-RELP   Level  3    Minutes  30    METs  3      Home Exercise Plan   Plans to continue exercise at  Home (comment)   walking   Frequency  Add 2 additional days to program exercise sessions.    Initial Home Exercises Provided  10/01/19       Nutrition:  Target Goals: Understanding of nutrition guidelines, daily intake of sodium 1500mg ,  cholesterol 200mg , calories 30% from fat and 7% or less from saturated fats, daily to have 5 or more servings of fruits and vegetables.  Biometrics: Pre Biometrics - 08/27/19 1537      Pre Biometrics   Height  5' 3.25" (1.607 m)    Weight  151 lb 11.2 oz (68.8 kg)    BMI (Calculated)  26.65    Single Leg Stand  30 seconds        Nutrition Therapy Plan and Nutrition Goals: Nutrition Therapy & Goals - 08/27/19 1524      Nutrition Therapy   Diet  DM, HH, low Na diet    Protein (specify units)  55g    Fiber  25 grams    Whole Grain Foods  3 servings    Saturated Fats  12 max. grams    Fruits and Vegetables  5 servings/day    Sodium  1.5 grams      Personal Nutrition Goals   Nutrition Goal  ST: continue with current changes pt did not want to make changes LT: not have another heart attack    Comments  Pt reports having cheerios with whole milk, white bread toast with butter. L sandwich with white bread and deli meat. D: chicken, fish, pork chops grilled with salad (raspberry vinegarette). Pt reports not enjoying the changes as she doesn't like to cook meat except red meat and doesn't like salad. Discussed some ways to improve cooking time and preparation, pt was not receptive. Discussed DM, HH friendly eating. Pt reports not wanting to make any changes.      Intervention Plan   Intervention  Prescribe, educate and counsel regarding individualized specific dietary modifications aiming towards targeted core components such as weight, hypertension, lipid management, diabetes, heart failure and other comorbidities.;Nutrition handout(s) given to patient.    Expected Outcomes  Short Term Goal: Understand basic principles of dietary content, such as calories, fat, sodium, cholesterol and nutrients.;Short Term Goal: A plan has been developed with personal nutrition goals set during dietitian appointment.;Long Term Goal: Adherence to prescribed nutrition plan.       Nutrition  Assessments: Nutrition Assessments - 08/27/19 1535      MEDFICTS Scores   Pre Score  54       Nutrition Goals Re-Evaluation: Nutrition Goals Re-Evaluation    Row Name 10/15/19 1142             Goals   Nutrition Goal  ST: continue with current changes pt did not want to make changes LT: not have another heart attack       Comment  Continue with current changes       Expected Outcome  ST: continue with current changes pt did not want to make changes LT: not have another heart attack          Nutrition Goals Discharge (Final Nutrition Goals Re-Evaluation): Nutrition Goals Re-Evaluation - 10/15/19 1142      Goals   Nutrition Goal  ST: continue with current changes pt did not want to make changes LT: not have another heart attack    Comment  Continue with current changes    Expected Outcome  ST: continue with current changes pt did not want to make changes LT: not have another heart attack       Psychosocial: Target Goals: Acknowledge presence or absence of significant depression and/or stress, maximize coping skills, provide positive support system. Participant is able to verbalize types and ability to use techniques and skills needed for reducing stress and depression.   Initial Review & Psychosocial Screening: Initial Psych Review & Screening - 10/01/19 1139      Initial Review   Current issues with  Current Stress Concerns    Comments  Peytin cares for her husband who has MD.  She has granchildren who visit frequently and this helps both of them.       Quality of Life Scores:  Quality of Life - 08/27/19 1539      Quality of Life   Select  Quality of Life      Quality of Life Scores   Health/Function Pre  22.18 %    Socioeconomic Pre  24.5 %    Psych/Spiritual Pre  24 %    Family Pre  26.6 %    GLOBAL Pre  23.73 %      Scores of 19 and below usually indicate a poorer quality of life in these areas.  A difference of  2-3 points is a clinically meaningful  difference.  A difference of 2-3 points in the total score of the Quality of Life Index has been associated with significant improvement in overall quality of life, self-image, physical symptoms, and general health in studies assessing change in quality of life.  PHQ-9: Recent Review Flowsheet Data    Depression screen [facility] 2/9 09/24/2019 08/27/2019   Decreased Interest 1 1   Down, Depressed, Hopeless 0 1   PHQ - 2 Score 1 2   Altered sleeping 2 0   Tired, decreased energy 1 2   Change in appetite 0 1   Feeling bad or failure about yourself  0 0   Trouble concentrating 0 0   Moving slowly or fidgety/restless 1 0   Suicidal thoughts 0 0   PHQ-9 Score 5 5   Difficult doing work/chores Not difficult at all Not difficult at all     Interpretation of Total Score  Total Score Depression Severity:  1-4 = Minimal depression, 5-9 = Mild depression, 10-14 = Moderate depression, 15-19 = Moderately severe depression, 20-27 = Severe depression   Psychosocial Evaluation and Intervention:  Psychosocial Re-Evaluation: Psychosocial Re-Evaluation    Row Name 10/01/19 1141 10/29/19 1139           Psychosocial Re-Evaluation   Current issues with  Current Stress Concerns  Current Stress Concerns      Comments  Bevely cares for her husband who has MD.  She has grandchildren who visit almost daily that is an outlet for both of them.  Chelbie husband fell last week and it dislodged a kidney stone.  He has been declining rapidly and she may have to leave the program.  Her daughter has also had to care for other relatives and hasnt been able to help as much.      Expected Outcomes  Short - continue self care Long - manage stress long term  Short - continue self careand try to use videos at home  Long - manage stress on her own         Psychosocial Discharge (Final Psychosocial Re-Evaluation): Psychosocial Re-Evaluation - 10/29/19 1139      Psychosocial Re-Evaluation   Current issues with  Current  Stress Concerns    Comments  Jana husband fell last week and it dislodged a kidney stone.  He has been declining rapidly and she may have to leave the program.  Her daughter has also had to care for other relatives and hasnt been able to help as much.    Expected Outcomes  Short - continue self careand try to use videos at home  Long - manage stress on her own       Vocational Rehabilitation: Provide vocational rehab assistance to qualifying candidates.   Vocational Rehab Evaluation & Intervention: Vocational Rehab - 08/26/19 1416      Initial Vocational Rehab Evaluation & Intervention   Assessment shows need for Vocational Rehabilitation  No       Education: Education Goals: Education classes will be provided on a variety of topics geared toward better understanding of heart health and risk factor modification. Participant will state understanding/return demonstration of topics presented as noted by education test scores.  Learning Barriers/Preferences: Learning Barriers/Preferences - 08/26/19 1421      Learning Barriers/Preferences   Learning Barriers  None    Learning Preferences  None       Education Topics:  AED/CPR: - Group verbal and written instruction with the use of models to demonstrate the basic use of the AED with the basic ABC's of resuscitation.   General Nutrition Guidelines/Fats and Fiber: -Group instruction provided by verbal, written material, models and posters to present the general guidelines for heart healthy nutrition. Gives an explanation and review of dietary fats and fiber.   Controlling Sodium/Reading Food Labels: -Group verbal and written material supporting the discussion of sodium use in heart healthy nutrition. Review and explanation with models, verbal and written materials for utilization of the food label.   Exercise Physiology & General Exercise Guidelines: - Group verbal and written instruction with models to review the exercise  physiology of the cardiovascular system and associated critical values. Provides general exercise guidelines with specific guidelines to those with heart or lung disease.    Aerobic Exercise & Resistance Training: - Gives group verbal and written instruction on the various components of exercise. Focuses on aerobic and resistive training programs and the benefits of this training and how to safely progress through these programs..   Flexibility, Balance, Mind/Body Relaxation: Provides group verbal/written instruction on the benefits of flexibility and balance training, including mind/body exercise modes such as yoga,  pilates and tai chi.  Demonstration and skill practice provided.   Stress and Anxiety: - Provides group verbal and written instruction about the health risks of elevated stress and causes of high stress.  Discuss the correlation between heart/lung disease and anxiety and treatment options. Review healthy ways to manage with stress and anxiety.   Depression: - Provides group verbal and written instruction on the correlation between heart/lung disease and depressed mood, treatment options, and the stigmas associated with seeking treatment.   Anatomy & Physiology of the Heart: - Group verbal and written instruction and models provide basic cardiac anatomy and physiology, with the coronary electrical and arterial systems. Review of Valvular disease and Heart Failure   Cardiac Procedures: - Group verbal and written instruction to review commonly prescribed medications for heart disease. Reviews the medication, class of the drug, and side effects. Includes the steps to properly store meds and maintain the prescription regimen. (beta blockers and nitrates)   Cardiac Medications I: - Group verbal and written instruction to review commonly prescribed medications for heart disease. Reviews the medication, class of the drug, and side effects. Includes the steps to properly store meds  and maintain the prescription regimen.   Cardiac Medications II: -Group verbal and written instruction to review commonly prescribed medications for heart disease. Reviews the medication, class of the drug, and side effects. (all other drug classes)    Go Sex-Intimacy & Heart Disease, Get SMART - Goal Setting: - Group verbal and written instruction through game format to discuss heart disease and the return to sexual intimacy. Provides group verbal and written material to discuss and apply goal setting through the application of the S.M.A.R.T. Method.   Other Matters of the Heart: - Provides group verbal, written materials and models to describe Stable Angina and Peripheral Artery. Includes description of the disease process and treatment options available to the cardiac patient.   Exercise & Equipment Safety: - Individual verbal instruction and demonstration of equipment use and safety with use of the equipment.   Cardiac Rehab from 08/27/2019 in [facility] Cardiac and Pulmonary Rehab  Date  08/27/19  Educator  AS  Instruction Review Code  1- Verbalizes Understanding      Infection Prevention: - Provides verbal and written material to individual with discussion of infection control including proper hand washing and proper equipment cleaning during exercise session.   Cardiac Rehab from 08/27/2019 in [facility] Cardiac and Pulmonary Rehab  Date  08/27/19  Educator  AS  Instruction Review Code  1- Verbalizes Understanding      Falls Prevention: - Provides verbal and written material to individual with discussion of falls prevention and safety.   Cardiac Rehab from 08/27/2019 in [facility] Cardiac and Pulmonary Rehab  Date  08/27/19  Educator  AS  Instruction Review Code  1- Verbalizes Understanding      Diabetes: - Individual verbal and written instruction to review signs/symptoms of diabetes, desired ranges of glucose level fasting, after meals and with exercise. Acknowledge that pre and post  exercise glucose checks will be done for 3 sessions at entry of program.   Cardiac Rehab from 08/27/2019 in [facility] Cardiac and Pulmonary Rehab  Date  08/27/19  Educator  [facility]  Instruction Review Code  1- Verbalizes Understanding      Know Your Numbers and Risk Factors: -Group verbal and written instruction about important numbers in your health.  Discussion of what are risk factors and how they play a role in the disease process.  Review of  Cholesterol, Blood Pressure, Diabetes, and BMI and the role they play in your overall health.   Sleep Hygiene: -Provides group verbal and written instruction about how sleep can affect your health.  Define sleep hygiene, discuss sleep cycles and impact of sleep habits. Review good sleep hygiene tips.    Other: -Provides group and verbal instruction on various topics (see comments)   Knowledge Questionnaire Score: Knowledge Questionnaire Score - 08/27/19 1537      Knowledge Questionnaire Score   Pre Score  24/26       Core Components/Risk Factors/Patient Goals at Admission: Personal Goals and Risk Factors at Admission - 08/27/19 1542      Core Components/Risk Factors/Patient Goals on Admission    Weight Management  Yes    Intervention  Weight Management: Develop a combined nutrition and exercise program designed to reach desired caloric intake, while maintaining appropriate intake of nutrient and fiber, sodium and fats, and appropriate energy expenditure required for the weight goal.;Weight Management: Provide education and appropriate resources to help participant work on and attain dietary goals.    Admit Weight  151 lb 11.2 oz (68.8 kg)    Goal Weight: Short Term  150 lb (68 kg)    Goal Weight: Long Term  150 lb (68 kg)    Expected Outcomes  Short Term: Continue to assess and modify interventions until short term weight is achieved;Long Term: Adherence to nutrition and physical activity/exercise program aimed toward attainment of established  weight goal;Weight Loss: Understanding of general recommendations for a balanced deficit meal plan, which promotes 1-2 lb weight loss per week and includes a negative energy balance of [phone] kcal/d       Core Components/Risk Factors/Patient Goals Review:  Goals and Risk Factor Review    Row Name 10/01/19 1135 10/29/19 1136           Core Components/Risk Factors/Patient Goals Review   Personal Goals Review  Weight Management/Obesity;Hypertension;Lipids;Diabetes  Weight Management/Obesity;Hypertension;Lipids      Review  Vickies BG has been better since she started exercising. Her weight is 148 today.  She is taking meds as directed.  Melaney is taking all meds as directed.  She is not sure how long she can continue exercise - her husband is declining rapidly and she has to take care of him.      Expected Outcomes  -  Short - attend when she can Long - maintain heart healthy habits         Core Components/Risk Factors/Patient Goals at Discharge (Final Review):  Goals and Risk Factor Review - 10/29/19 1136      Core Components/Risk Factors/Patient Goals Review   Personal Goals Review  Weight Management/Obesity;Hypertension;Lipids    Review  Jalaysia is taking all meds as directed.  She is not sure how long she can continue exercise - her husband is declining rapidly and she has to take care of him.    Expected Outcomes  Short - attend when she can Long - maintain heart healthy habits       ITP Comments: ITP Comments    Row Name 08/26/19 1427 08/27/19 1544 09/18/19 1341 10/16/19 0635 10/23/19 1218   ITP Comments  Virtual Orientation completed today. Has appt tomorrow with RP/RD for evals  and for gym orientation. Documentation of diagnosis can be found in [facility] 8/17.  and nutrition consult completed. ITP created and sent to Dr Hyacinth Meeker for review.  30 day review completed. ITP sent to Dr. Bethann Punches, Medical Director of  Cardiac and Pulmonary Rehab. Continue with ITP unless changes are  made by physician.  Department closed starting 10/2 until further notice by infection prevention and Health at Work teams for COVID-19.  30 day review completed. Continue with ITP sent to Dr. Bethann Punches, Medical Director of Cardiac and Pulmonary Rehab for review , changes as needed and signature.  Vennesa called yesterday to let us know that her husband fell overnight and that she would be out.   Row Name 11/05/19 1120           ITP Comments  Rasheka graduated today from  rehab with 15 sessions completed.  Details of the patient's exercise prescription and what She needs to do in order to continue the prescription and progress were discussed with patient.  Patient was given a copy of prescription and goals.  Patient verbalized understanding.  Lori-Ann plans to continue to exercise at home          Comments: Discharged today    Early Exit

## 2019-11-05 NOTE — Progress Notes (Signed)
Discharge Progress Report  Patient Details  Name: TENNESSEE TEMPLE MRN: [id number] Date of Birth: Sep 24, 1951 Referring Provider:     Cardiac Rehab from 08/27/2019 in [facility] Cardiac and Pulmonary Rehab  Referring Provider  End       Number of Visits: 15/36  Reason for Discharge:  Early Exit:  Personal  Smoking History:  Social History   Tobacco Use  Smoking Status Former Smoker  Smokeless Tobacco Never Used  Tobacco Comment   Quit 30 years ago    Diagnosis:  NSTEMI (non-ST elevation myocardial infarction) (HCC)  Status post coronary artery stent placement  ADL [facility]:   Initial Exercise Prescription: Initial Exercise Prescription - 08/27/19 1500      Date of Initial Exercise RX and Referring Provider   Date  08/27/19    Referring Provider  End      Recumbant Bike   Level  3    RPM  60    Watts  33    Minutes  15    METs  3.5      NuStep   Level  3    SPM  80    Minutes  15    METs  3.5      Arm Ergometer   Level  1    RPM  25    Minutes  15    METs  3.5      Recumbant Elliptical   Level  2    RPM  50    Minutes  15    METs  3.5      REL-XR   Level  3    Speed  50    Minutes  15    METs  3.5      Prescription Details   Frequency (times per week)  3    Duration  Progress to 30 minutes of continuous aerobic without signs/symptoms of physical distress      Intensity   THRR 40-80% of Max Heartrate  110-138    Ratings of Perceived Exertion  11-13    Perceived Dyspnea  0-4      Resistance Training   Training Prescription  Yes    Weight  3 lb    Reps  10-15       Discharge Exercise Prescription (Final Exercise Prescription Changes): Exercise Prescription Changes - 10/23/19 1200      Response to Exercise   Blood Pressure (Admit)  120/60    Blood Pressure (Exercise)  120/70    Blood Pressure (Exit)  110/60    Heart Rate (Admit)  85 bpm    Heart Rate (Exercise)  90 bpm    Heart Rate (Exit)  71 bpm    Rating of Perceived Exertion  (Exercise)  13    Symptoms  none    Duration  Continue with 30 min of aerobic exercise without signs/symptoms of physical distress.    Intensity  THRR unchanged      Progression   Progression  Continue to progress workloads to maintain intensity without signs/symptoms of physical distress.    Average METs  2.13      Resistance Training   Training Prescription  Yes    Weight  3 lb    Reps  10-15      Interval Training   Interval Training  No      Recumbant Bike   Level  3    Minutes  15      NuStep   Level  3    Minutes  15    METs  1.9      REL-XR   Level  3    Minutes  15    METs  1.5      Biostep-RELP   Level  3    Minutes  30    METs  3      Home Exercise Plan   Plans to continue exercise at  Home (comment)   walking   Frequency  Add 2 additional days to program exercise sessions.    Initial Home Exercises Provided  10/01/19       Functional Capacity: 6 Minute Walk    Row Name 08/27/19 1529         6 Minute Walk   Phase  Initial     Distance  1365 feet     Walk Time  6 minutes     # of Rest Breaks  0     MPH  2.6     METS  3.5     RPE  9     Perceived Dyspnea   0     VO2 Peak  12.24     Symptoms  No     Resting HR  83 bpm     Resting BP  120/62     Resting Oxygen Saturation   97 %     Exercise Oxygen Saturation  during 6 min walk  98 %     Max Ex. HR  116 bpm     Max Ex. BP  160/64     2 Minute Post BP  124/64        Psychological, QOL, Others - Outcomes: PHQ 2/9: Depression screen [facility] 2/9 09/24/2019 08/27/2019  Decreased Interest 1 1  Down, Depressed, Hopeless 0 1  PHQ - 2 Score 1 2  Altered sleeping 2 0  Tired, decreased energy 1 2  Change in appetite 0 1  Feeling bad or failure about yourself  0 0  Trouble concentrating 0 0  Moving slowly or fidgety/restless 1 0  Suicidal thoughts 0 0  PHQ-9 Score 5 5  Difficult doing work/chores Not difficult at all Not difficult at all    Quality of Life: Quality of Life - 08/27/19 1539       Quality of Life   Select  Quality of Life      Quality of Life Scores   Health/Function Pre  22.18 %    Socioeconomic Pre  24.5 %    Psych/Spiritual Pre  24 %    Family Pre  26.6 %    GLOBAL Pre  23.73 %       Personal Goals: Goals established at orientation with interventions provided to work toward goal. Personal Goals and Risk Factors at Admission - 08/27/19 1542      Core Components/Risk Factors/Patient Goals on Admission    Weight Management  Yes    Intervention  Weight Management: Develop a combined nutrition and exercise program designed to reach desired caloric intake, while maintaining appropriate intake of nutrient and fiber, sodium and fats, and appropriate energy expenditure required for the weight goal.;Weight Management: Provide education and appropriate resources to help participant work on and attain dietary goals.    Admit Weight  151 lb 11.2 oz (68.8 kg)    Goal Weight: Short Term  150 lb (68 kg)    Goal Weight: Long Term  150 lb (68 kg)    Expected Outcomes  Short Term:  Continue to assess and modify interventions until short term weight is achieved;Long Term: Adherence to nutrition and physical activity/exercise program aimed toward attainment of established weight goal;Weight Loss: Understanding of general recommendations for a balanced deficit meal plan, which promotes 1-2 lb weight loss per week and includes a negative energy balance of [phone] kcal/d        Personal Goals Discharge: Goals and Risk Factor Review    Row Name 10/01/19 1135 10/29/19 1136           Core Components/Risk Factors/Patient Goals Review   Personal Goals Review  Weight Management/Obesity;Hypertension;Lipids;Diabetes  Weight Management/Obesity;Hypertension;Lipids      Review  Vickies BG has been better since she started exercising. Her weight is 148 today.  She is taking meds as directed.  Bevely is taking all meds as directed.  She is not sure how long she can continue exercise -  her husband is declining rapidly and she has to take care of him.      Expected Outcomes  -  Short - attend when she can Long - maintain heart healthy habits         Exercise Goals and Review: Exercise Goals    Row Name 08/27/19 1536             Exercise Goals   Increase Physical Activity  Yes       Intervention  Provide advice, education, support and counseling about physical activity/exercise needs.;Develop an individualized exercise prescription for aerobic and resistive training based on initial evaluation findings, risk stratification, comorbidities and participant's personal goals.       Expected Outcomes  Short Term: Attend rehab on a regular basis to increase amount of physical activity.;Long Term: Add in home exercise to make exercise part of routine and to increase amount of physical activity.;Long Term: Exercising regularly at least 3-5 days a week.       Increase Strength and Stamina  Yes       Intervention  Provide advice, education, support and counseling about physical activity/exercise needs.;Develop an individualized exercise prescription for aerobic and resistive training based on initial evaluation findings, risk stratification, comorbidities and participant's personal goals.       Expected Outcomes  Short Term: Increase workloads from initial exercise prescription for resistance, speed, and METs.;Short Term: Perform resistance training exercises routinely during rehab and add in resistance training at home;Long Term: Improve cardiorespiratory fitness, muscular endurance and strength as measured by increased METs and functional capacity ( )       Able to understand and use rate of perceived exertion (RPE) scale  Yes       Intervention  Provide education and explanation on how to use RPE scale       Expected Outcomes  Short Term: Able to use RPE daily in rehab to express subjective intensity level;Long Term:  Able to use RPE to guide intensity level when exercising  independently       Able to understand and use Dyspnea scale  Yes       Intervention  Provide education and explanation on how to use Dyspnea scale       Expected Outcomes  Short Term: Able to use Dyspnea scale daily in rehab to express subjective sense of shortness of breath during exertion;Long Term: Able to use Dyspnea scale to guide intensity level when exercising independently       Knowledge and understanding of Target Heart Rate Range (THRR)  Yes       Intervention  Provide  education and explanation of THRR including how the numbers were predicted and where they are located for reference       Expected Outcomes  Short Term: Able to state/look up THRR;Short Term: Able to use daily as guideline for intensity in rehab;Long Term: Able to use THRR to govern intensity when exercising independently       Able to check pulse independently  Yes       Intervention  Provide education and demonstration on how to check pulse in carotid and radial arteries.;Review the importance of being able to check your own pulse for safety during independent exercise       Expected Outcomes  Short Term: Able to explain why pulse checking is important during independent exercise;Long Term: Able to check pulse independently and accurately       Understanding of Exercise Prescription  Yes          Exercise Goals Re-Evaluation: Exercise Goals Re-Evaluation    Row Name 08/29/19 1140 09/10/19 1514 09/24/19 1555 10/01/19 1128 10/08/19 1415     Exercise Goal Re-Evaluation   Exercise Goals Review  Increase Physical Activity;Increase Strength and Stamina;Able to understand and use rate of perceived exertion (RPE) scale;Knowledge and understanding of Target Heart Rate Range (THRR);Able to check pulse independently;Understanding of Exercise Prescription  Increase Physical Activity;Increase Strength and Stamina;Understanding of Exercise Prescription  Increase Physical Activity;Increase Strength and Stamina;Understanding of  Exercise Prescription  Increase Physical Activity;Increase Strength and Stamina;Able to understand and use rate of perceived exertion (RPE) scale;Knowledge and understanding of Target Heart Rate Range (THRR);Able to check pulse independently;Understanding of Exercise Prescription  Increase Physical Activity;Increase Strength and Stamina;Able to understand and use rate of perceived exertion (RPE) scale;Knowledge and understanding of Target Heart Rate Range (THRR);Able to check pulse independently;Understanding of Exercise Prescription   Comments  Reviewed RPE scale, THR and program prescription with pt today.  Pt voiced understanding and was given a copy of goals to take home.  Sharmayne is doing well in rehab.  She has really taken to the NuStep.  We will continue to monitor her progress.  Medrith continues to do well in rehab.  She is making strides on the NuStep and it shows!  We will continue to monitor her progression.  Alayshia plans to do videos from staff and strength work at home.  We reviewed THR,RPE, signs and s/s anduse of NTG.  Lodema is attending consistently.  She will benefit from adding sessions at home   Expected Outcomes  Short: Use RPE daily to regulate intensity. Long: Follow program prescription in THR.  Short: Begin to increase workloads.  Long: Continue to improve stamina.  Short: Review home exercise guidelines.  Long: Contineu to improve stamina  Short - exercise at home 1-2 days outside program sessions Long - maintain exercise on her own  Short - add structured exercise at home (not just yardwork) Long - incrrease MET level   Row Name 10/23/19 1219             Exercise Goal Re-Evaluation   Exercise Goals Review  Increase Physical Activity;Increase Strength and Stamina;Understanding of Exercise Prescription       Comments  Judit has been doing well in rehab.  she is up to level 3 across the board.  We will continue to boost workloads and monitor risk factors.       Expected Outcomes   Short: Increase workloads.  Long: Continue to walk more at home.  Nutrition & Weight - Outcomes: Pre Biometrics - 08/27/19 1537      Pre Biometrics   Height  5' 3.25" (1.607 m)    Weight  151 lb 11.2 oz (68.8 kg)    BMI (Calculated)  26.65    Single Leg Stand  30 seconds        Nutrition: Nutrition Therapy & Goals - 08/27/19 1524      Nutrition Therapy   Diet  DM, HH, low Na diet    Protein (specify units)  55g    Fiber  25 grams    Whole Grain Foods  3 servings    Saturated Fats  12 max. grams    Fruits and Vegetables  5 servings/day    Sodium  1.5 grams      Personal Nutrition Goals   Nutrition Goal  ST: continue with current changes pt did not want to make changes LT: not have another heart attack    Comments  Pt reports having cheerios with whole milk, white bread toast with butter. L sandwich with white bread and deli meat. D: chicken, fish, pork chops grilled with salad (raspberry vinegarette). Pt reports not enjoying the changes as she doesn't like to cook meat except red meat and doesn't like salad. Discussed some ways to improve cooking time and preparation, pt was not receptive. Discussed DM, HH friendly eating. Pt reports not wanting to make any changes.      Intervention Plan   Intervention  Prescribe, educate and counsel regarding individualized specific dietary modifications aiming towards targeted core components such as weight, hypertension, lipid management, diabetes, heart failure and other comorbidities.;Nutrition handout(s) given to patient.    Expected Outcomes  Short Term Goal: Understand basic principles of dietary content, such as calories, fat, sodium, cholesterol and nutrients.;Short Term Goal: A plan has been developed with personal nutrition goals set during dietitian appointment.;Long Term Goal: Adherence to prescribed nutrition plan.       Nutrition Discharge: Nutrition Assessments - 08/27/19 1535      MEDFICTS Scores   Pre Score  54        Education Questionnaire Score: Knowledge Questionnaire Score - 08/27/19 1537      Knowledge Questionnaire Score   Pre Score  24/26       Goals reviewed with patient; copy given to patient.

## 2019-11-05 NOTE — Progress Notes (Signed)
Daily Session Note  Patient Details  Name: Donna Harper MRN: [id number] Date of Birth: 08-10-1951 Referring Provider:     Cardiac Rehab from 08/27/2019 in [facility]  Referring Provider  End      Encounter Date: 11/05/2019  Check In: Session Check In - 11/05/19 1119      Check-In   Supervising physician immediately available to respond to emergencies  See telemetry face sheet for immediately available ER MD    Location  [facility]-Cardiac [facility] Pulmonary Rehab    Staff Present  Heath Lark, RN, BSN, CCRP;Jeanna Durrell BS, Exercise Physiologist;Amanda Oletta Darter, BA, ACSM CEP, Exercise Physiologist    Virtual Visit  No    Medication changes reported      No    Fall or balance concerns reported     No    Warm-up and Cool-down  Performed on first and last piece of equipment    Resistance Training Performed  Yes    VAD Patient?  No    PAD/SET Patient?  No      Pain Assessment   Currently in Pain?  No/denies          Social History   Tobacco Use  Smoking Status Former Smoker  Smokeless Tobacco Never Used  Tobacco Comment   Quit 30 years ago    Goals Met:  Independence with exercise equipment Exercise tolerated well Personal goals reviewed No report of cardiac concerns or symptoms  Goals Unmet:  Not Applicable  Comments:  Amariz graduated today from  rehab with 15 sessions completed.  Details of the patient's exercise prescription and what She needs to do in order to continue the prescription and progress were discussed with patient.  Patient was given a copy of prescription and goals.  Patient verbalized understanding.  Tiaria plans to continue to exercise at home    Dr. Emily Filbert is Medical Director for [facility].

## 2019-11-05 NOTE — Patient Instructions (Signed)
Discharge Patient Instructions  Patient Details  Name: Donna Harper MRN: [id number] Date of Birth: 12-Jul-1951 Referring Provider:  Corky Downs, MD   Number of Visits: 15  Reason for Discharge:  Early Exit:  Personal  Smoking History:  Social History   Tobacco Use  Smoking Status Former Smoker  Smokeless Tobacco Never Used  Tobacco Comment   Quit 30 years ago    Diagnosis:  NSTEMI (non-ST elevation myocardial infarction) (HCC)  Status post coronary artery stent placement  Initial Exercise Prescription: Initial Exercise Prescription - 08/27/19 1500      Date of Initial Exercise RX and Referring Provider   Date  08/27/19    Referring Provider  End      Recumbant Bike   Level  3    RPM  60    Watts  33    Minutes  15    METs  3.5      NuStep   Level  3    SPM  80    Minutes  15    METs  3.5      Arm Ergometer   Level  1    RPM  25    Minutes  15    METs  3.5      Recumbant Elliptical   Level  2    RPM  50    Minutes  15    METs  3.5      REL-XR   Level  3    Speed  50    Minutes  15    METs  3.5      Prescription Details   Frequency (times per week)  3    Duration  Progress to 30 minutes of continuous aerobic without signs/symptoms of physical distress      Intensity   THRR 40-80% of Max Heartrate  110-138    Ratings of Perceived Exertion  11-13    Perceived Dyspnea  0-4      Resistance Training   Training Prescription  Yes    Weight  3 lb    Reps  10-15       Discharge Exercise Prescription (Final Exercise Prescription Changes): Exercise Prescription Changes - 10/23/19 1200      Response to Exercise   Blood Pressure (Admit)  120/60    Blood Pressure (Exercise)  120/70    Blood Pressure (Exit)  110/60    Heart Rate (Admit)  85 bpm    Heart Rate (Exercise)  90 bpm    Heart Rate (Exit)  71 bpm    Rating of Perceived Exertion (Exercise)  13    Symptoms  none    Duration  Continue with 30 min of aerobic exercise without  signs/symptoms of physical distress.    Intensity  THRR unchanged      Progression   Progression  Continue to progress workloads to maintain intensity without signs/symptoms of physical distress.    Average METs  2.13      Resistance Training   Training Prescription  Yes    Weight  3 lb    Reps  10-15      Interval Training   Interval Training  No      Recumbant Bike   Level  3    Minutes  15      NuStep   Level  3    Minutes  15    METs  1.9      REL-XR   Level  3    Minutes  15    METs  1.5      Biostep-RELP   Level  3    Minutes  30    METs  3      Home Exercise Plan   Plans to continue exercise at  Home (comment)   walking   Frequency  Add 2 additional days to program exercise sessions.    Initial Home Exercises Provided  10/01/19       Functional Capacity: 6 Minute Walk    Row Name 08/27/19 1529         6 Minute Walk   Phase  Initial     Distance  1365 feet     Walk Time  6 minutes     # of Rest Breaks  0     MPH  2.6     METS  3.5     RPE  9     Perceived Dyspnea   0     VO2 Peak  12.24     Symptoms  No     Resting HR  83 bpm     Resting BP  120/62     Resting Oxygen Saturation   97 %     Exercise Oxygen Saturation  during 6 min walk  98 %     Max Ex. HR  116 bpm     Max Ex. BP  160/64     2 Minute Post BP  124/64        Quality of Life: Quality of Life - 08/27/19 1539      Quality of Life   Select  Quality of Life      Quality of Life Scores   Health/Function Pre  22.18 %    Socioeconomic Pre  24.5 %    Psych/Spiritual Pre  24 %    Family Pre  26.6 %    GLOBAL Pre  23.73 %       Personal Goals: Goals established at orientation with interventions provided to work toward goal. Personal Goals and Risk Factors at Admission - 08/27/19 1542      Core Components/Risk Factors/Patient Goals on Admission    Weight Management  Yes    Intervention  Weight Management: Develop a combined nutrition and exercise program designed to  reach desired caloric intake, while maintaining appropriate intake of nutrient and fiber, sodium and fats, and appropriate energy expenditure required for the weight goal.;Weight Management: Provide education and appropriate resources to help participant work on and attain dietary goals.    Admit Weight  151 lb 11.2 oz (68.8 kg)    Goal Weight: Short Term  150 lb (68 kg)    Goal Weight: Long Term  150 lb (68 kg)    Expected Outcomes  Short Term: Continue to assess and modify interventions until short term weight is achieved;Long Term: Adherence to nutrition and physical activity/exercise program aimed toward attainment of established weight goal;Weight Loss: Understanding of general recommendations for a balanced deficit meal plan, which promotes 1-2 lb weight loss per week and includes a negative energy balance of [phone] kcal/d        Personal Goals Discharge: Goals and Risk Factor Review - 10/29/19 1136      Core Components/Risk Factors/Patient Goals Review   Personal Goals Review  Weight Management/Obesity;Hypertension;Lipids    Review  [name] is taking all meds as directed.  She is not sure how long she can continue exercise - her husband is  declining rapidly and she has to take care of him.    Expected Outcomes  Short - attend when she can Long - maintain heart healthy habits       Exercise Goals and Review: Exercise Goals    Row Name 08/27/19 1536             Exercise Goals   Increase Physical Activity  Yes       Intervention  Provide advice, education, support and counseling about physical activity/exercise needs.;Develop an individualized exercise prescription for aerobic and resistive training based on initial evaluation findings, risk stratification, comorbidities and participant's personal goals.       Expected Outcomes  Short Term: Attend rehab on a regular basis to increase amount of physical activity.;Long Term: Add in home exercise to make exercise part of routine and to  increase amount of physical activity.;Long Term: Exercising regularly at least 3-5 days a week.       Increase Strength and Stamina  Yes       Intervention  Provide advice, education, support and counseling about physical activity/exercise needs.;Develop an individualized exercise prescription for aerobic and resistive training based on initial evaluation findings, risk stratification, comorbidities and participant's personal goals.       Expected Outcomes  Short Term: Increase workloads from initial exercise prescription for resistance, speed, and METs.;Short Term: Perform resistance training exercises routinely during rehab and add in resistance training at home;Long Term: Improve cardiorespiratory fitness, muscular endurance and strength as measured by increased METs and functional capacity ( )       Able to understand and use rate of perceived exertion (RPE) scale  Yes       Intervention  Provide education and explanation on how to use RPE scale       Expected Outcomes  Short Term: Able to use RPE daily in rehab to express subjective intensity level;Long Term:  Able to use RPE to guide intensity level when exercising independently       Able to understand and use Dyspnea scale  Yes       Intervention  Provide education and explanation on how to use Dyspnea scale       Expected Outcomes  Short Term: Able to use Dyspnea scale daily in rehab to express subjective sense of shortness of breath during exertion;Long Term: Able to use Dyspnea scale to guide intensity level when exercising independently       Knowledge and understanding of Target Heart Rate Range (THRR)  Yes       Intervention  Provide education and explanation of THRR including how the numbers were predicted and where they are located for reference       Expected Outcomes  Short Term: Able to state/look up THRR;Short Term: Able to use daily as guideline for intensity in rehab;Long Term: Able to use THRR to govern intensity when exercising  independently       Able to check pulse independently  Yes       Intervention  Provide education and demonstration on how to check pulse in carotid and radial arteries.;Review the importance of being able to check your own pulse for safety during independent exercise       Expected Outcomes  Short Term: Able to explain why pulse checking is important during independent exercise;Long Term: Able to check pulse independently and accurately       Understanding of Exercise Prescription  Yes          Exercise Goals Re-Evaluation: Exercise Goals  Re-Evaluation    Row Name 08/29/19 1140 09/10/19 1514 09/24/19 1555 10/01/19 1128 10/08/19 1415     Exercise Goal Re-Evaluation   Exercise Goals Review  Increase Physical Activity;Increase Strength and Stamina;Able to understand and use rate of perceived exertion (RPE) scale;Knowledge and understanding of Target Heart Rate Range (THRR);Able to check pulse independently;Understanding of Exercise Prescription  Increase Physical Activity;Increase Strength and Stamina;Understanding of Exercise Prescription  Increase Physical Activity;Increase Strength and Stamina;Understanding of Exercise Prescription  Increase Physical Activity;Increase Strength and Stamina;Able to understand and use rate of perceived exertion (RPE) scale;Knowledge and understanding of Target Heart Rate Range (THRR);Able to check pulse independently;Understanding of Exercise Prescription  Increase Physical Activity;Increase Strength and Stamina;Able to understand and use rate of perceived exertion (RPE) scale;Knowledge and understanding of Target Heart Rate Range (THRR);Able to check pulse independently;Understanding of Exercise Prescription   Comments  Reviewed RPE scale, THR and program prescription with pt today.  Pt voiced understanding and was given a copy of goals to take home.  Yaa is doing well in rehab.  She has really taken to the NuStep.  We will continue to monitor her progress.  Meridee  continues to do well in rehab.  She is making strides on the NuStep and it shows!  We will continue to monitor her progression.  Catha plans to do videos from staff and strength work at home.  We reviewed THR,RPE, signs and s/s anduse of NTG.  Larkyn is attending consistently.  She will benefit from adding sessions at home   Expected Outcomes  Short: Use RPE daily to regulate intensity. Long: Follow program prescription in THR.  Short: Begin to increase workloads.  Long: Continue to improve stamina.  Short: Review home exercise guidelines.  Long: Contineu to improve stamina  Short - exercise at home 1-2 days outside program sessions Long - maintain exercise on her own  Short - add structured exercise at home (not just yardwork) Long - incrrease MET level   Row Name 10/23/19 1219             Exercise Goal Re-Evaluation   Exercise Goals Review  Increase Physical Activity;Increase Strength and Stamina;Understanding of Exercise Prescription       Comments  Mireily has been doing well in rehab.  she is up to level 3 across the board.  We will continue to boost workloads and monitor risk factors.       Expected Outcomes  Short: Increase workloads.  Long: Continue to walk more at home.          Nutrition & Weight - Outcomes: Pre Biometrics - 08/27/19 1537      Pre Biometrics   Height  5' 3.25" (1.607 m)    Weight  151 lb 11.2 oz (68.8 kg)    BMI (Calculated)  26.65    Single Leg Stand  30 seconds        Nutrition: Nutrition Therapy & Goals - 08/27/19 1524      Nutrition Therapy   Diet  DM, HH, low Na diet    Protein (specify units)  55g    Fiber  25 grams    Whole Grain Foods  3 servings    Saturated Fats  12 max. grams    Fruits and Vegetables  5 servings/day    Sodium  1.5 grams      Personal Nutrition Goals   Nutrition Goal  ST: continue with current changes pt did not want to make changes LT: not have another heart attack  Comments  Pt reports having cheerios with whole milk,  white bread toast with butter. L sandwich with white bread and deli meat. D: chicken, fish, pork chops grilled with salad (raspberry vinegarette). Pt reports not enjoying the changes as she doesn't like to cook meat except red meat and doesn't like salad. Discussed some ways to improve cooking time and preparation, pt was not receptive. Discussed DM, HH friendly eating. Pt reports not wanting to make any changes.      Intervention Plan   Intervention  Prescribe, educate and counsel regarding individualized specific dietary modifications aiming towards targeted core components such as weight, hypertension, lipid management, diabetes, heart failure and other comorbidities.;Nutrition handout(s) given to patient.    Expected Outcomes  Short Term Goal: Understand basic principles of dietary content, such as calories, fat, sodium, cholesterol and nutrients.;Short Term Goal: A plan has been developed with personal nutrition goals set during dietitian appointment.;Long Term Goal: Adherence to prescribed nutrition plan.       Nutrition Discharge: Nutrition Assessments - 08/27/19 1535      MEDFICTS Scores   Pre Score  54       Education Questionnaire Score: Knowledge Questionnaire Score - 08/27/19 1537      Knowledge Questionnaire Score   Pre Score  24/26       Goals reviewed with patient; copy given to patient.

## 2019-11-12 ENCOUNTER — Ambulatory Visit: Payer: Medicare Other | Admitting: Internal Medicine

## 2019-12-05 ENCOUNTER — Telehealth: Payer: Self-pay | Admitting: Internal Medicine

## 2019-12-05 NOTE — Telephone Encounter (Signed)
I think it is unlike that acute back pain is due to Praluent.  It is reasonable to hold tomorrow's dose of Praluent.  I agree with further evaluation by PCP, urgent care, or ED.  Nelva Bush, MD [facility]

## 2019-12-05 NOTE — Telephone Encounter (Signed)
Pt c/o medication issue:  1. Name of Medication: Praluent   2. How are you currently taking this medication (dosage and times per day)? 150 MG every 14 days   3. Are you having a reaction (difficulty breathing--STAT)? Back pain  4. What is your medication issue? Patient calling, states that patient has been noticing severe back pain since starting medication - feels this may be what is causing it. Patient is due for another shot tomorrow and does not wish to take if it will cause more pain.  Tylenol is not helping and would like to speak with nurse.

## 2019-12-05 NOTE — Telephone Encounter (Signed)
I spoke with the patient. She states she has been on Praulent for "a couple of months maybe."  She is due to take a Praulent injection tomorrow. She is now having severe back pain x 5 days that she feels may be related to the Praulent. Her pain is in her lower back. She states it is on the right side and now seems to be moving to the left a little bit.   She denies that she has injured her back.   She has tried tylenol with no relief.  I have advised her she may hold her praluent tomorrow, but I am uncertain at this point that her pain is related her medication. I did advise her I am concerned she has a kidney stone.  She states she pain is "not high enough" for that.  I have urged her to contact her PCP for further recommendations. She states "I don't have much faith in him." I explained to her that my options for her are limited at this time other than the ER/ Urgent Care to evaluate her acute pain further.  I have offered her to take ibuprofen for a dose or two to see if this helps, but do not recommend this long term due to her increased bleeding risk.  The patient voices understanding and is agreeable.   Will forward to Dr. Saunders Revel for further review.

## 2019-12-10 NOTE — Telephone Encounter (Signed)
Returned call to patient to get update on lower back pain. She reports that after holding praluent she continued to have back pain so she now believes sx are not related.  She went to drug store and picked up medication and will administer today.   Pt denies any urinary sx and states that urine is clear. She is in touch with PCP to further investigate back pain.   Advised pt to call for any further questions or concerns.

## 2019-12-23 DIAGNOSIS — E119 Type 2 diabetes mellitus without complications: Secondary | ICD-10-CM | POA: Diagnosis not present

## 2019-12-23 DIAGNOSIS — M720 Palmar fascial fibromatosis [Dupuytren]: Secondary | ICD-10-CM | POA: Diagnosis not present

## 2019-12-23 DIAGNOSIS — I10 Essential (primary) hypertension: Secondary | ICD-10-CM | POA: Diagnosis not present

## 2019-12-25 DIAGNOSIS — R69 Illness, unspecified: Secondary | ICD-10-CM | POA: Diagnosis not present

## 2020-01-07 ENCOUNTER — Telehealth: Payer: Self-pay

## 2020-01-07 NOTE — Telephone Encounter (Signed)
PA submitted through cover my meds after received fax from medicare stating PA was needed.  Key: [id number]  Prior auth:  See response This request has received a Favorable outcome.  Please note any additional information provided by [employer] Part D at the bottom of this request.  Made call to patient to make her aware.   No answer, left detailed vm.   Advised pt to call for any further questions or concerns.

## 2020-01-21 ENCOUNTER — Telehealth: Payer: Self-pay | Admitting: Internal Medicine

## 2020-01-21 NOTE — Telephone Encounter (Signed)
Called patient and she verbalized understanding of Dr Darnelle Bos recommendations. She denies any severe allergic reactions to a vaccine in the past.

## 2020-01-21 NOTE — Telephone Encounter (Signed)
Unless she has had a severe allergic reaction to a vaccine in the past, I recommend that she receive a COVID-19 vaccine as soon as it is available to her.  Nelva Bush, MD [facility]

## 2020-01-21 NOTE — Telephone Encounter (Signed)
Patient calling States one of her friends was recommended by her doctor to not take the COVID vaccine Patient is scheduled to take vaccine but is concerned - most of her allergies are drug related allergies  Indicated that our doctors do recommend that all patients take vaccine but she would like to speak with nurse  Please call to discuss

## 2020-04-22 DIAGNOSIS — Z7902 Long term (current) use of antithrombotics/antiplatelets: Secondary | ICD-10-CM | POA: Diagnosis not present

## 2020-04-22 DIAGNOSIS — E663 Overweight: Secondary | ICD-10-CM | POA: Diagnosis not present

## 2020-04-22 DIAGNOSIS — Z7982 Long term (current) use of aspirin: Secondary | ICD-10-CM | POA: Diagnosis not present

## 2020-04-22 DIAGNOSIS — I10 Essential (primary) hypertension: Secondary | ICD-10-CM | POA: Diagnosis not present

## 2020-04-22 DIAGNOSIS — E785 Hyperlipidemia, unspecified: Secondary | ICD-10-CM | POA: Diagnosis not present

## 2020-04-22 DIAGNOSIS — J309 Allergic rhinitis, unspecified: Secondary | ICD-10-CM | POA: Diagnosis not present

## 2020-04-22 DIAGNOSIS — K219 Gastro-esophageal reflux disease without esophagitis: Secondary | ICD-10-CM | POA: Diagnosis not present

## 2020-04-22 DIAGNOSIS — I2510 Atherosclerotic heart disease of native coronary artery without angina pectoris: Secondary | ICD-10-CM | POA: Diagnosis not present

## 2020-04-22 DIAGNOSIS — I252 Old myocardial infarction: Secondary | ICD-10-CM | POA: Diagnosis not present

## 2020-04-22 DIAGNOSIS — E119 Type 2 diabetes mellitus without complications: Secondary | ICD-10-CM | POA: Diagnosis not present

## 2020-04-29 ENCOUNTER — Encounter: Payer: Self-pay | Admitting: Internal Medicine

## 2020-04-29 ENCOUNTER — Ambulatory Visit (INDEPENDENT_AMBULATORY_CARE_PROVIDER_SITE_OTHER): Payer: Medicare HMO | Admitting: Internal Medicine

## 2020-04-29 ENCOUNTER — Other Ambulatory Visit: Payer: Self-pay

## 2020-04-29 VITALS — BP 124/70 | HR 70 | Ht 63.0 in | Wt 153.2 lb

## 2020-04-29 DIAGNOSIS — I2510 Atherosclerotic heart disease of native coronary artery without angina pectoris: Secondary | ICD-10-CM

## 2020-04-29 DIAGNOSIS — G729 Myopathy, unspecified: Secondary | ICD-10-CM

## 2020-04-29 DIAGNOSIS — E785 Hyperlipidemia, unspecified: Secondary | ICD-10-CM

## 2020-04-29 DIAGNOSIS — G720 Drug-induced myopathy: Secondary | ICD-10-CM

## 2020-04-29 DIAGNOSIS — I255 Ischemic cardiomyopathy: Secondary | ICD-10-CM

## 2020-04-29 DIAGNOSIS — I10 Essential (primary) hypertension: Secondary | ICD-10-CM

## 2020-04-29 DIAGNOSIS — T466X5A Adverse effect of antihyperlipidemic and antiarteriosclerotic drugs, initial encounter: Secondary | ICD-10-CM

## 2020-04-29 MED ORDER — CLOPIDOGREL BISULFATE 75 MG PO TABS: 75.0000 mg | ORAL_TABLET | Freq: Every day | ORAL | 3 refills | Status: AC

## 2020-04-29 NOTE — Progress Notes (Signed)
Follow-up Outpatient Visit Date: 04/29/2020  Primary Care Provider: Cletis Athens, MD [address] [place] [address]  Chief Complaint: Follow-up coronary artery disease  HPI:  Donna Harper is a 69 y.o. female with history of coronary artery disease status post PCI to the RCA in the setting of NSTEMI (07/2019), hypertension, hyperlipidemia, type 2 diabetes mellitus, TIA, and GERD, who presents for follow-up of CAD.  She was last seen in our office in 10/2019 by Dr. Rockey Situ, at which time she reported significant improvement in her shortness of breath since switching from ticagrelor to clopidogrel.  Today, Donna Harper reports she is feeling well.  She denies chest pain, shortness of breath, palpitations, lightheadedness, edema.  She is tolerating aspirin, clopidogrel, and Praluent well.  She notes easy bruising, particularly when being bitten by her young dog.  She inquires as to how long she will need to remain on antiplatelet therapy.  --------------------------------------------------------------------------------------------------  Past Medical History:  Diagnosis Date  . CAD (coronary artery disease)    a. 07/2019 NSTEMI/PCI: LM 15d, LAD 40p, 50/41m, RI nl, LCX nl, OM1 small, RCA large, 30p, 39m (3.0x12 Resolute Onyx DES), RPDA fills via L->R collats (OM1), EF 45-50%.  [name] GERD (gastroesophageal reflux disease)   . Hyperlipidemia LDL goal <70   . Hypertension   . Ischemic cardiomyopathy    a. 07/30/2019 LV gram: EF 45-50%, basal inf HK; b. 07/31/2019 Echo: EF 55-60%, impaired relaxation, sev basal inf HK. Nl RV fxn.   . Joint pain   . Murmur    a. 07/2019 Echo: no significant valvular abnormalities. Mild AoV thickening.  [name] TIA (transient ischemic attack) 08/2014  . Type II diabetes mellitus ([place])    Past Surgical History:  Procedure Laterality Date  . BREAST BIOPSY Bilateral 1988   benign/Dr Jamal Collin  . CARDIAC CATHETERIZATION    . CESAREAN SECTION  1986  . CORONARY STENT  INTERVENTION N/A 07/30/2019   Procedure: CORONARY STENT INTERVENTION;  Surgeon: Nelva Bush, MD;  Location: [facility];  Service: Cardiovascular;  Laterality: N/A;  RCA  . LEFT HEART CATH AND CORONARY ANGIOGRAPHY N/A 07/30/2019   Procedure: LEFT HEART CATH AND CORONARY ANGIOGRAPHY;  Surgeon: Nelva Bush, MD;  Location: [facility];  Service: Cardiovascular;  Laterality: N/A;    Current Meds  Medication Sig  . Alirocumab (PRALUENT) 150 MG/ML SOAJ Inject 150 mg into the skin every 14 (fourteen) days.  [name] aspirin 81 MG tablet Take 81 mg by mouth daily.  . cholecalciferol (VITAMIN D) 1000 UNITS tablet Take 1,000 Units by mouth daily.  . clopidogrel (PLAVIX) 75 MG tablet Take 1 tablet (75 mg total) by mouth daily.  . diphenhydrAMINE (BENADRYL) 25 MG tablet Take 25 mg by mouth at bedtime.  [name] glipiZIDE (GLUCOTROL XL) 5 MG 24 hr tablet Take 5 mg by mouth daily.  [name] losartan (COZAAR) 100 MG tablet Take 100 mg by mouth daily.  . metFORMIN (GLUCOPHAGE) 1000 MG tablet Take 1,000 mg by mouth 2 (two) times daily with a meal.  . metoprolol tartrate (LOPRESSOR) 25 MG tablet Take 1 tablet (25 mg total) by mouth 2 (two) times daily.  . Multiple Vitamin (MULTIVITAMIN WITH MINERALS) TABS tablet Take 1 tablet by mouth daily.  [name] NASONEX 50 MCG/ACT nasal spray Place 2 sprays into the nose daily.   . nitroGLYCERIN (NITROSTAT) 0.4 MG SL tablet Place 1 tablet (0.4 mg total) under the tongue every 5 (five) minutes as needed for chest pain.  [name] nystatin (MYCOSTATIN)  100000 UNIT/ML suspension Take 5 mLs by mouth as needed.  . vitamin E 400 UNIT capsule Take 400 Units by mouth daily.    Allergies: Atorvastatin, Erythromycin, Levaquin [levofloxacin in d5w], Penicillins, Rosuvastatin, Simvastatin, Tequin [gatifloxacin], Zoloft [sertraline hcl], and Zithromax [azithromycin]  Social History   Tobacco Use  . Smoking status: Former Research scientist (life sciences)  . Smokeless tobacco: Never Used  . Tobacco comment: Quit  30 years ago  Substance Use Topics  . Alcohol use: No    Alcohol/week: 0.0 standard drinks  . Drug use: No    Family History  Problem Relation Age of Onset  . COPD Mother   . COPD Father     Review of Systems: A 12-system review of systems was performed and was negative except as noted in the HPI.  --------------------------------------------------------------------------------------------------  Physical Exam: BP 124/70 (BP Location: Left Arm, Patient Position: Sitting, Cuff Size: Normal)   Pulse 70   Ht 5\' 3"  (1.6 m)   Wt 153 lb 4 oz (69.5 kg)   SpO2 97%   BMI 27.15 kg/m   General: NAD. Neck: No JVD or HJR. Lungs: Clear to auscultation without wheezes or crackles. Heart: Regular rate and rhythm with 2/6 systolic murmur.  No rubs or gallops.  Abdomen: Soft, nontender, nondistended. Extremities: No lower extremity edema.  Ecchymoses noted on both forearms.  EKG: Normal sinus rhythm with low voltage.  Otherwise, no significant abnormality.  Lab Results  Component Value Date   WBC 5.9 07/31/2019   HGB 11.2 (L) 07/31/2019   HCT 32.5 (L) 07/31/2019   MCV 87.6 07/31/2019   PLT 166 07/31/2019    Lab Results  Component Value Date   NA 139 10/29/2019   K 4.6 10/29/2019   CL 105 10/29/2019   CO2 23 10/29/2019   BUN 22 10/29/2019   CREATININE 0.87 10/29/2019   GLUCOSE 134 (H) 10/29/2019   ALT 18 10/29/2019    Lab Results  Component Value Date   CHOL 156 10/29/2019   HDL 43 10/29/2019   LDLCALC UNABLE TO CALCULATE IF TRIGLYCERIDE OVER 400 mg/dL 10/29/2019   LDLDIRECT 43.5 10/29/2019   TRIG 429 (H) 10/29/2019   CHOLHDL 3.6 10/29/2019    --------------------------------------------------------------------------------------------------  ASSESSMENT AND PLAN: Coronary artery disease without angina: Donna Harper continues to do well with resolution of dyspnea following transition from ticagrelor to clopidogrel.  She has not had any further angina.  We will plan to  continue aspirin and clopidogrel for 12 months from the time of her NSTEMI in 07/2019.  At that time, she can discontinue clopidogrel and remain on low-dose aspirin.  Continue secondary prevention with Praluent.  Hyperlipidemia: Donna Harper is tolerating Praluent well.  We will plan to check a fasting lipid panel and ALT shortly before follow-up in 6 months to ensure that her LDL remains well controlled and that her triglycerides have improved.  We discussed the importance of lifestyle modifications, including avoidance of saturated fats and simple carbohydrates.  Ischemic cardiomyopathy: Donna Harper appears euvolemic and well compensated with NYHA class I-II symptoms.  Continue current doses of losartan and metoprolol.  Hypertension: Blood pressure well controlled.  No medication changes today.  Follow-up: Return to clinic in 6 months.  Nelva Bush, MD 04/29/2020 10:38 AM

## 2020-04-29 NOTE — Patient Instructions (Signed)
Medication Instructions:  Your physician has recommended you make the following change in your medication:  At the end of August, please STOP Plavix.  *If you need a refill on your cardiac medications before your next appointment, please call your pharmacy*   Lab Work: Your physician recommends that you return for lab work in: In 6 months prior to follow up appointment ~ 10/2020. - Will check lipid panel for cholesterol and ALT.  - You will need to be fasting. Please do not have anything to eat or drink after midnight the morning you have the lab work. You may only have water or black coffee with no cream or sugar. - Please go to the [facility]. You will check in at the front desk to the right as you walk into the atrium. Valet Parking is offered if needed. - No appointment needed. You may go any day between 7 am and 6 pm.   If you have labs (blood work) drawn today and your tests are completely normal, you will receive your results only by: [name] MyChart Message (if you have MyChart) OR . A paper copy in the mail If you have any lab test that is abnormal or we need to change your treatment, we will call you to review the results.   Testing/Procedures: none   Follow-Up: At [facility], you and your health needs are our priority.  As part of our continuing mission to provide you with exceptional heart care, we have created designated Provider Care Teams.  These Care Teams include your primary Cardiologist (physician) and Advanced Practice Providers (APPs -  Physician Assistants and Nurse Practitioners) who all work together to provide you with the care you need, when you need it.  We recommend signing up for the patient portal called "MyChart".  Sign up information is provided on this After Visit Summary.  MyChart is used to connect with patients for Virtual Visits (Telemedicine).  Patients are able to view lab/test results, encounter notes, upcoming appointments, etc.  Non-urgent  messages can be sent to your provider as well.   To learn more about what you can do with MyChart, go to [email or web address].    Your next appointment:   6 month(s) - make sure you get your lab work sometime within the week before the appointment.  The format for your next appointment:   In Person  Provider:    You may see Nelva Bush, MD or one of the following Advanced Practice Providers on your designated Care Team:    Murray Hodgkins, NP  Christell Faith, PA-C  Marrianne Mood, PA-C

## 2020-04-30 ENCOUNTER — Encounter: Payer: Self-pay | Admitting: Internal Medicine

## 2020-05-12 ENCOUNTER — Other Ambulatory Visit: Payer: Self-pay | Admitting: *Deleted

## 2020-05-12 ENCOUNTER — Other Ambulatory Visit: Payer: Self-pay

## 2020-05-12 ENCOUNTER — Encounter: Payer: Self-pay | Admitting: Internal Medicine

## 2020-05-12 ENCOUNTER — Ambulatory Visit (INDEPENDENT_AMBULATORY_CARE_PROVIDER_SITE_OTHER): Payer: Medicare HMO | Admitting: Internal Medicine

## 2020-05-12 VITALS — BP 132/75 | HR 89 | Wt 152.2 lb

## 2020-05-12 DIAGNOSIS — I10 Essential (primary) hypertension: Secondary | ICD-10-CM | POA: Diagnosis not present

## 2020-05-12 DIAGNOSIS — E785 Hyperlipidemia, unspecified: Secondary | ICD-10-CM

## 2020-05-12 DIAGNOSIS — K8020 Calculus of gallbladder without cholecystitis without obstruction: Secondary | ICD-10-CM | POA: Diagnosis not present

## 2020-05-12 DIAGNOSIS — E1369 Other specified diabetes mellitus with other specified complication: Secondary | ICD-10-CM | POA: Diagnosis not present

## 2020-05-12 DIAGNOSIS — E119 Type 2 diabetes mellitus without complications: Secondary | ICD-10-CM | POA: Diagnosis not present

## 2020-05-12 LAB — GLUCOSE, POCT (MANUAL RESULT ENTRY): POC Glucose: 180 mg/dl — AB (ref 70–99)

## 2020-05-12 MED ORDER — SITAGLIPTIN PHOSPHATE 50 MG PO TABS: 50.0000 mg | ORAL_TABLET | Freq: Every day | ORAL | 6 refills | Status: DC

## 2020-05-12 NOTE — Assessment & Plan Note (Signed)
Patient blood pressure is stable at the present time.

## 2020-05-12 NOTE — Assessment & Plan Note (Signed)
We will order an ultrasound of the gallbladder.

## 2020-05-12 NOTE — Progress Notes (Addendum)
Established Patient Office Visit  Subjective:  Patient ID: Donna Harper, female    DOB: 1951/02/04  Age: 69 y.o. MRN: [id number]  CC:  Chief Complaint  Patient presents with  . Diabetes    Elevated blood sugar, BS has been running around 165-175 fasting    HPI  Donna Harper presents for ration of the abdominal pain.  She also want her diabetic medicine to be changed.  Patient complaining of abdominal pain last week is like a crampy pain 8 x 10 radiating to the back with some nausea no vomiting patient does not smoke or drink and came for evaluation of that.  Her blood sugar is 180 and she is on glipizide and Metformin right now but want her medicine to be changed.  .  She is also known to have coronary artery disease and has been taking Praluent.  Past Medical History:  Diagnosis Date  . CAD (coronary artery disease)    a. 07/2019 NSTEMI/PCI: LM 15d, LAD 40p, 50/31m, RI nl, LCX nl, OM1 small, RCA large, 30p, 46m (3.0x12 Resolute Onyx DES), RPDA fills via L->R collats (OM1), EF 45-50%.  [name] GERD (gastroesophageal reflux disease)   . Hyperlipidemia LDL goal <70   . Hypertension   . Ischemic cardiomyopathy    a. 07/30/2019 LV gram: EF 45-50%, basal inf HK; b. 07/31/2019 Echo: EF 55-60%, impaired relaxation, sev basal inf HK. Nl RV fxn.   . Joint pain   . Murmur    a. 07/2019 Echo: no significant valvular abnormalities. Mild AoV thickening.  [name] TIA (transient ischemic attack) 08/2014  . Type II diabetes mellitus ([facility])      Past Surgical History:  Procedure Laterality Date  . BREAST BIOPSY Bilateral 1988   benign/Dr Evette Cristal  . CARDIAC CATHETERIZATION    . CESAREAN SECTION  1986  . CORONARY STENT INTERVENTION N/A 07/30/2019   Procedure: CORONARY STENT INTERVENTION;  Surgeon: [name], MD;  Location: [facility];  Service: Cardiovascular;  Laterality: N/A;  RCA  . LEFT HEART CATH AND CORONARY ANGIOGRAPHY N/A 07/30/2019   Procedure: LEFT HEART CATH AND CORONARY ANGIOGRAPHY;  Surgeon: [name], MD;  Location: [facility];  Service: Cardiovascular;  Laterality: N/A;    Family History  Problem Relation Age of Onset  . COPD Mother   . COPD Father     Social History   Socioeconomic History  . Marital status: Married    Spouse name: Not on file  . Number of children: Not on file  . Years of education: Not on file  . Highest education level: Not on file  Occupational History  . Not on file  Tobacco Use  . Smoking status: Former Games developer  . Smokeless tobacco: Never Used  . Tobacco comment: Quit 30 years ago  Substance and Sexual Activity  . Alcohol use: No    Alcohol/week: 0.0 standard drinks  . Drug use: No  . Sexual activity: Not on file  Other Topics Concern  . Not on file  Social History Narrative  . Not on file   Social Determinants of Health   Financial Resource Strain:   . Difficulty of Paying Living Expenses:   Food Insecurity:   . Worried About Programme researcher, broadcasting/film/video in the Last Year:   . Barista in the Last Year:   Transportation Needs:   . Freight forwarder (Medical):   [name] Lack of Transportation (Non-Medical):   Physical Activity:   .  Days of Exercise per Week:   . Minutes of Exercise per Session:   Stress:   . Feeling of Stress :   Social Connections:   . Frequency of Communication with Friends and Family:   . Frequency of Social Gatherings with Friends and Family:   . Attends Religious Services:   . Active Member of Clubs or  Organizations:   . Attends Banker Meetings:   [name] Marital Status:   Intimate Partner Violence:   . Fear of Current or Ex-Partner:   . Emotionally Abused:   [name] Physically Abused:   . Sexually Abused:      Current Outpatient Medications:  .  Alirocumab (PRALUENT) 150 MG/ML SOAJ, Inject 150 mg into the skin every 14 (fourteen) days., Disp: 2 pen, Rfl: 11 .  aspirin 81 MG tablet, Take 81 mg by mouth daily., Disp: , Rfl:  .  cholecalciferol (VITAMIN D) 1000 UNITS tablet, Take 1,000 Units by mouth daily., Disp: , Rfl:  .  clopidogrel (PLAVIX) 75 MG tablet, Take 1 tablet (75 mg total) by mouth daily., Disp: 90 tablet, Rfl: 3 .  diphenhydrAMINE (BENADRYL) 25 MG tablet, Take 25 mg by mouth at bedtime., Disp: , Rfl:  .  glipiZIDE (GLUCOTROL XL) 5 MG 24 hr tablet, Take 5 mg by mouth daily., Disp: , Rfl:  .  losartan (COZAAR) 100 MG tablet, Take 100 mg by mouth daily., Disp: , Rfl:  .  metFORMIN (GLUCOPHAGE) 1000 MG tablet, Take 1,000 mg by mouth 2 (two) times daily with a meal., Disp: , Rfl:  .  metoprolol tartrate (LOPRESSOR) 25 MG tablet, Take 1 tablet (25 mg total) by mouth 2 (two) times daily., Disp: 60 tablet, Rfl: 11 .  Multiple Vitamin (MULTIVITAMIN WITH MINERALS) TABS tablet, Take 1 tablet by mouth daily., Disp: , Rfl:  .  NASONEX 50 MCG/ACT nasal spray, Place 2 sprays into the nose daily. , Disp: , Rfl: 0 .  nitroGLYCERIN (NITROSTAT) 0.4 MG SL tablet, Place 1 tablet (0.4 mg total) under the tongue every 5 (five) minutes as needed for chest pain., Disp: 90 tablet, Rfl: 3 .  vitamin E 400 UNIT capsule, Take 400 Units by mouth daily., Disp: , Rfl:  .  sitaGLIPtin (JANUVIA) 50 MG tablet, Take 1 tablet (50 mg total) by mouth daily., Disp: 30 tablet, Rfl: 6   Allergies  Allergen Reactions  . Atorvastatin     Myalgias  . Erythromycin     Stomach Problems'   . Levaquin [Levofloxacin In D5w]   . Penicillins     Heart races  . Rosuvastatin     Myalgias  . Simvastatin      Myalgias  . Tequin [Gatifloxacin]   . Zoloft [Sertraline Hcl]   . Zithromax [Azithromycin] Palpitations    Heart race    ROS Review of Systems  Constitutional: Negative.  Negative for chills.  HENT: Negative.  Negative for voice change.   Eyes: Negative for pain.  Respiratory: Negative for cough, chest tightness and shortness of breath.   Cardiovascular: Negative.  Negative for chest pain.  Gastrointestinal: Positive for abdominal pain.       Patient had abdominal pain last week and  Genitourinary: Negative for urgency.  Musculoskeletal: Negative for back pain.  Neurological: Negative.   Hematological: Negative.   Psychiatric/Behavioral: Negative.       Objective:    Physical Exam  Patient is well-nourished white female in no acute distress.  Head normocephalic neck is supple jugular venous pressure is  not elevated carotid upstroke is 2+ without any bruits there is no goiter goiter no lymphadenopathy trachea is central.  Neck is supple.  On examination of the cardiovascular system.  First heart sounds normal second heart sounds are normal no murmur is audible chest decreased breath sounds without any rales or rhonchi.  Abdomen is soft there is no tenderness in the right upper quadrant.  Murphy sign is negative.  Bowel sounds are audible.  No hepatosplenomegaly is noted.  On examination of the locomotor system she does not have any pedal edema or calf tenderness.  Peripheral pulses are 2+.  Neurological examination is nonfocal.  BP 132/75   Pulse 89   Wt 152 lb 3.2 oz (69 kg)   BMI 26.96 kg/m  Wt Readings from Last 3 Encounters:  05/12/20 152 lb 3.2 oz (69 kg)  04/29/20 153 lb 4 oz (69.5 kg)  11/01/19 151 lb (68.5 kg)     Health Maintenance Due  Topic Date Due  . Hepatitis C Screening  Never done  . TETANUS/TDAP  Never done  . MAMMOGRAM  Never done  . COLONOSCOPY  Never done  . DEXA SCAN  Never done    There are no preventive care reminders to display for this  patient.  Lab Results  Component Value Date   TSH 4.41 07/22/2014   Lab Results  Component Value Date   WBC 5.9 07/31/2019   HGB 11.2 (L) 07/31/2019   HCT 32.5 (L) 07/31/2019   MCV 87.6 07/31/2019   PLT 166 07/31/2019   Lab Results  Component Value Date   NA 139 10/29/2019   K 4.6 10/29/2019   CO2 23 10/29/2019   GLUCOSE 134 (H) 10/29/2019   BUN 22 10/29/2019   CREATININE 0.87 10/29/2019   BILITOT 0.9 10/29/2019   ALKPHOS 56 10/29/2019   AST 23 10/29/2019   ALT 18 10/29/2019   PROT 7.2 10/29/2019   ALBUMIN 4.3 10/29/2019   CALCIUM 9.8 10/29/2019   ANIONGAP 11 10/29/2019   Lab Results  Component Value Date   CHOL 156 10/29/2019   Lab Results  Component Value Date   HDL 43 10/29/2019   Lab Results  Component Value Date   LDLCALC UNABLE TO CALCULATE IF TRIGLYCERIDE OVER 400 mg/dL 65/78/4696   Lab Results  Component Value Date   TRIG 429 (H) 10/29/2019   Lab Results  Component Value Date   CHOLHDL 3.6 10/29/2019   Lab Results  Component Value Date   HGBA1C 6.5 (H) 07/30/2019      Assessment & Plan:   Problem List Items Addressed This Visit      Cardiovascular and Mediastinum   Essential hypertension    Patient blood pressure is stable at the present time.      Relevant Orders   CBC with Differential/Platelet   COMPLETE METABOLIC PANEL WITH GFR   Hemoglobin A1c   Amylase     Digestive   Calculus of gallbladder without cholecystitis without obstruction    We will order an ultrasound of the gallbladder.        Other   Hyperlipidemia LDL goal <70    Patient is on Praluent injection       Other Visit Diagnoses    Other specified diabetes mellitus with other specified complication, unspecified whether long term insulin use (HCC)    -  Primary   Relevant Medications   sitaGLIPtin (JANUVIA) 50 MG tablet   Other Relevant Orders   POCT glucose (manual entry) (Completed)  CBC with Differential/Platelet   COMPLETE METABOLIC PANEL WITH GFR    Hemoglobin A1c   Amylase   Type 2 diabetes mellitus without complication, without long-term current use of insulin (HCC)       Relevant Medications   sitaGLIPtin (JANUVIA) 50 MG tablet   Other Relevant Orders   CBC with Differential/Platelet   COMPLETE METABOLIC PANEL WITH GFR   Hemoglobin A1c   Amylase      Meds ordered this encounter  Medications  . sitaGLIPtin (JANUVIA) 50 MG tablet    Sig: Take 1 tablet (50 mg total) by mouth daily.    Dispense:  30 tablet    Refill:  6   1. Other specified diabetes mellitus with other specified complication, unspecified whether long term insulin use (HCC) Blood sugar is 188 today I am going to start her on Januvia 50 mg p.o. daily. - POCT glucose (manual entry) - CBC with Differential/Platelet - COMPLETE METABOLIC PANEL WITH GFR - Hemoglobin A1c - Amylase  2. Essential hypertension Blood pressure is stable today. - CBC with Differential/Platelet - COMPLETE METABOLIC PANEL WITH GFR - Hemoglobin A1c - Amylase  3. Hyperlipidemia LDL goal <70 Patient is on Praluent  4. Calculus of gallbladder without cholecystitis without obstruction We will get ultrasound abdomen  Follow-up: Return in about 1 week (around 05/19/2020).    Corky Downs, MD

## 2020-05-12 NOTE — Assessment & Plan Note (Signed)
Patient is on Praluent injection

## 2020-05-13 LAB — CBC WITH DIFFERENTIAL/PLATELET
Absolute Monocytes: 416 cells/uL (ref 200–950)
Basophils Absolute: 42 cells/uL (ref 0–200)
Basophils Relative: 0.8 %
Eosinophils Absolute: 244 cells/uL (ref 15–500)
Eosinophils Relative: 4.7 %
HCT: 36.3 % (ref 35.0–45.0)
Hemoglobin: 12.1 g/dL (ref 11.7–15.5)
Lymphs Abs: 1446 cells/uL (ref 850–3900)
MCH: 29.7 pg (ref 27.0–33.0)
MCHC: 33.3 g/dL (ref 32.0–36.0)
MCV: 89.2 fL (ref 80.0–100.0)
MPV: 11.6 fL (ref 7.5–12.5)
Monocytes Relative: 8 %
Neutro Abs: 3052 cells/uL (ref 1500–7800)
Neutrophils Relative %: 58.7 %
Platelets: 174 10*3/uL (ref 140–400)
RBC: 4.07 10*6/uL (ref 3.80–5.10)
RDW: 13.7 % (ref 11.0–15.0)
Total Lymphocyte: 27.8 %
WBC: 5.2 10*3/uL (ref 3.8–10.8)

## 2020-05-13 LAB — AMYLASE: Amylase: 102 U/L — ABNORMAL HIGH (ref 21–101)

## 2020-05-13 LAB — COMPLETE METABOLIC PANEL WITH GFR
AG Ratio: 1.9 (calc) (ref 1.0–2.5)
ALT: 14 U/L (ref 6–29)
AST: 19 U/L (ref 10–35)
Albumin: 4.4 g/dL (ref 3.6–5.1)
Alkaline phosphatase (APISO): 57 U/L (ref 37–153)
BUN: 19 mg/dL (ref 7–25)
CO2: 19 mmol/L — ABNORMAL LOW (ref 20–32)
Calcium: 9.5 mg/dL (ref 8.6–10.4)
Chloride: 108 mmol/L (ref 98–110)
Creat: 0.88 mg/dL (ref 0.50–0.99)
GFR, Est African American: 78 mL/min/{1.73_m2} (ref >=60)
GFR, Est Non African American: 67 mL/min/{1.73_m2} (ref >=60)
Globulin: 2.3 g/dL (calc) (ref 1.9–3.7)
Glucose, Bld: 149 mg/dL — ABNORMAL HIGH (ref 65–99)
Potassium: 5 mmol/L (ref 3.5–5.3)
Sodium: 140 mmol/L (ref 135–146)
Total Bilirubin: 0.5 mg/dL (ref 0.2–1.2)
Total Protein: 6.7 g/dL (ref 6.1–8.1)

## 2020-05-13 LAB — HEMOGLOBIN A1C
Hgb A1c MFr Bld: 5.9 % of total Hgb — ABNORMAL HIGH (ref <5.7)
Mean Plasma Glucose: 123 (calc)
eAG (mmol/L): 6.8 (calc)

## 2020-05-15 ENCOUNTER — Other Ambulatory Visit: Payer: Self-pay

## 2020-05-15 ENCOUNTER — Ambulatory Visit (INDEPENDENT_AMBULATORY_CARE_PROVIDER_SITE_OTHER): Payer: Medicare HMO | Admitting: Internal Medicine

## 2020-05-15 ENCOUNTER — Encounter: Payer: Self-pay | Admitting: Internal Medicine

## 2020-05-15 VITALS — BP 124/67 | HR 78

## 2020-05-15 DIAGNOSIS — E785 Hyperlipidemia, unspecified: Secondary | ICD-10-CM

## 2020-05-15 DIAGNOSIS — E1169 Type 2 diabetes mellitus with other specified complication: Secondary | ICD-10-CM

## 2020-05-15 DIAGNOSIS — I10 Essential (primary) hypertension: Secondary | ICD-10-CM | POA: Diagnosis not present

## 2020-05-15 DIAGNOSIS — E119 Type 2 diabetes mellitus without complications: Secondary | ICD-10-CM | POA: Insufficient documentation

## 2020-05-15 DIAGNOSIS — K8020 Calculus of gallbladder without cholecystitis without obstruction: Secondary | ICD-10-CM

## 2020-05-15 DIAGNOSIS — R0602 Shortness of breath: Secondary | ICD-10-CM

## 2020-05-15 NOTE — Progress Notes (Signed)
Established Patient Office Visit  Subjective:  Patient ID: Donna Harper, female    DOB: 08-01-1951  Age: 69 y.o. MRN: [id number]  CC:  Chief Complaint  Patient presents with  . Lab results    HPI  Donna Harper presents for review of the lab test normal diabetes elevated triglyceride mildly overweight denies any chest pain but has been seen in my office in the last visit with abdominal pain.  And wants to get an ultrasound of the abdomen.  For her diabetes control she is taking Metformin as well as glipizide  Past Medical History:  Diagnosis Date  . CAD (coronary artery disease)    a. 07/2019 NSTEMI/PCI: LM 15d, LAD 40p, 50/36m, RI nl, LCX nl, OM1 small, RCA large, 30p, 85m (3.0x12 Resolute Onyx DES), RPDA fills via L->R collats (OM1), EF 45-50%.  [name] GERD (gastroesophageal reflux disease)   . Hyperlipidemia LDL goal <70   . Hypertension   . Ischemic cardiomyopathy    a. 07/30/2019 LV gram: EF 45-50%, basal inf HK; b. 07/31/2019 Echo: EF 55-60%, impaired relaxation, sev basal inf HK. Nl RV fxn.   . Joint pain   . Murmur    a. 07/2019 Echo: no significant valvular abnormalities. Mild AoV thickening.  [name] TIA (transient ischemic attack) 08/2014  . Type II diabetes mellitus (HCC)     Past Surgical History:  Procedure Laterality Date  . BREAST BIOPSY Bilateral 1988   benign/Dr Evette Cristal  . CARDIAC CATHETERIZATION    . CESAREAN SECTION  1986  . CORONARY STENT INTERVENTION N/A 07/30/2019   Procedure: CORONARY STENT INTERVENTION;  Surgeon: [name], MD;  Location: [facility] IN[facility]ASIVE [facility];  Service: Cardiovascular;  Laterality: N/A;  RCA  . LEFT HEART CATH AND CORONARY ANGIOGRAPHY N/A 07/30/2019   Procedure: LEFT HEART CATH AND CORONARY ANGIOGRAPHY;  Surgeon: [name], MD;  Location: [facility]AS[facility] CV [facility];  Service: Cardiovascular;  Laterality: N/A;    Family History  Problem Relation Age of Onset  . COPD Mother   . COPD Father     Social History   Socioeconomic  History  . Marital status: Married    Spouse name: Not on file  . Number of children: Not on file  . Years of education: Not on file  . Highest education level: Not on file  Occupational History  . Not on file  Tobacco Use  . Smoking status: Former Games developer  . Smokeless tobacco: Never Used  . Tobacco comment: Quit 30 years ago  Substance and Sexual Activity  . Alcohol use: No    Alcohol/week: 0.0 standard drinks  . Drug use: No  . Sexual activity: Not on file  Other Topics Concern  . Not on file  Social History Narrative  . Not on file   Social Determinants of Health   Financial Resource Strain:   . Difficulty of Paying Living Expenses:   Food Insecurity:   . Worried About Programme researcher, broadcasting/film/video in the Last Year:   . Barista in the Last Year:   Transportation Needs:   . Freight forwarder (Medical):   [name] Lack of Transportation (Non-Medical):   Physical Activity:   . Days of Exercise per Week:   . Minutes of Exercise per Session:   Stress:   . Feeling of Stress :   Social Connections:   . Frequency of Communication with Friends and Family:   . Frequency of Social Gatherings with Friends and Family:   .  Attends Religious Services:   . Active Member of Clubs or Organizations:   . Attends Banker Meetings:   [name] Marital Status:   Intimate Partner Violence:   . Fear of Current or Ex-Partner:   . Emotionally Abused:   [name] Physically Abused:   . Sexually Abused:      Current Outpatient Medications:  .  Alirocumab (PRALUENT) 150 MG/ML SOAJ, Inject 150 mg into the skin every 14 (fourteen) days., Disp: 2 pen, Rfl: 11 .  aspirin 81 MG tablet, Take 81 mg by mouth daily., Disp: , Rfl:  .  cholecalciferol (VITAMIN D) 1000 UNITS tablet, Take 1,000 Units by mouth daily., Disp: , Rfl:  .  clopidogrel (PLAVIX) 75 MG tablet, Take 1 tablet (75 mg total) by mouth daily., Disp: 90 tablet, Rfl: 3 .  diphenhydrAMINE (BENADRYL) 25 MG tablet, Take 25 mg by mouth at  bedtime., Disp: , Rfl:  .  glipiZIDE (GLUCOTROL XL) 5 MG 24 hr tablet, Take 5 mg by mouth daily., Disp: , Rfl:  .  losartan (COZAAR) 100 MG tablet, Take 100 mg by mouth daily., Disp: , Rfl:  .  metFORMIN (GLUCOPHAGE) 1000 MG tablet, Take 1,000 mg by mouth 2 (two) times daily with a meal., Disp: , Rfl:  .  metoprolol tartrate (LOPRESSOR) 25 MG tablet, Take 1 tablet (25 mg total) by mouth 2 (two) times daily., Disp: 60 tablet, Rfl: 11 .  Multiple Vitamin (MULTIVITAMIN WITH MINERALS) TABS tablet, Take 1 tablet by mouth daily., Disp: , Rfl:  .  NASONEX 50 MCG/ACT nasal spray, Place 2 sprays into the nose daily. , Disp: , Rfl: 0 .  nitroGLYCERIN (NITROSTAT) 0.4 MG SL tablet, Place 1 tablet (0.4 mg total) under the tongue every 5 (five) minutes as needed for chest pain., Disp: 90 tablet, Rfl: 3 .  sitaGLIPtin (JANUVIA) 50 MG tablet, Take 1 tablet (50 mg total) by mouth daily., Disp: 30 tablet, Rfl: 6 .  vitamin E 400 UNIT capsule, Take 400 Units by mouth daily., Disp: , Rfl:    Allergies  Allergen Reactions  . Atorvastatin     Myalgias  . Erythromycin     Stomach Problems'   . Levaquin [Levofloxacin In D5w]   . Penicillins     Heart races  . Rosuvastatin     Myalgias  . Simvastatin     Myalgias  . Tequin [Gatifloxacin]   . Zoloft [Sertraline Hcl]   . Zithromax [Azithromycin] Palpitations    Heart race    ROS Review of Systems  Constitutional: Negative.   HENT: Negative.   Eyes: Negative.   Respiratory: Negative.   Cardiovascular: Negative.   Gastrointestinal: Positive for abdominal pain.  Genitourinary: Negative.   Musculoskeletal: Negative.   Neurological: Negative.   Hematological: Negative.   Psychiatric/Behavioral: Negative.       Objective:    Physical Exam    Constitutional: The patient is oriented to person, place, and time. Pt appears well-developed and well-nourished.  Head: Normocephalic and atraumatic.  Eyes: Pupils are equal, round, and reactive to light.    Neck: No JVD present. No tracheal deviation present. No thyromegaly present.  Cardiovascular: Regular rate and rhythm. No gallop. Pulmonary/Chest: Normal breath sounds. Lungs clear to auscultation. Abdominal: No abdominal tenderness. No guarding or rebound tenderness.. Musculoskeletal: Normal range of motion.  Lymphatic: No cervical adenopathy.  Neurological: No cranial nerve deficit.  Skin: Skin is warm and hydrated.  Psychiatric: The patient has a normal mood and affect.   BP 124/67  Pulse 78  Wt Readings from Last 3 Encounters:  05/12/20 152 lb 3.2 oz (69 kg)  04/29/20 153 lb 4 oz (69.5 kg)  11/01/19 151 lb (68.5 kg)     Health Maintenance Due  Topic Date Due  . Hepatitis C Screening  Never done  . FOOT EXAM  Never done  . OPHTHALMOLOGY EXAM  Never done  . TETANUS/TDAP  Never done  . MAMMOGRAM  Never done  . COLONOSCOPY  Never done  . DEXA SCAN  Never done    There are no preventive care reminders to display for this patient.  Lab Results  Component Value Date   TSH 4.41 07/22/2014   Lab Results  Component Value Date   WBC 5.2 05/12/2020   HGB 12.1 05/12/2020   HCT 36.3 05/12/2020   MCV 89.2 05/12/2020   PLT 174 05/12/2020   Lab Results  Component Value Date   NA 140 05/12/2020   K 5.0 05/12/2020   CO2 19 (L) 05/12/2020   GLUCOSE 149 (H) 05/12/2020   BUN 19 05/12/2020   CREATININE 0.88 05/12/2020   BILITOT 0.5 05/12/2020   ALKPHOS 56 10/29/2019   AST 19 05/12/2020   ALT 14 05/12/2020   PROT 6.7 05/12/2020   ALBUMIN 4.3 10/29/2019   CALCIUM 9.5 05/12/2020   ANIONGAP 11 10/29/2019   Lab Results  Component Value Date   CHOL 156 10/29/2019   Lab Results  Component Value Date   HDL 43 10/29/2019   Lab Results  Component Value Date   LDLCALC UNABLE TO CALCULATE IF TRIGLYCERIDE OVER 400 mg/dL 40/98/1191   Lab Results  Component Value Date   TRIG 429 (H) 10/29/2019   Lab Results  Component Value Date   CHOLHDL 3.6 10/29/2019   Lab  Results  Component Value Date   HGBA1C 5.9 (H) 05/12/2020      Assessment & Plan:   Problem List Items Addressed This Visit      Cardiovascular and Mediastinum   Essential hypertension  stable     Digestive   Calculus of gallbladder without cholecystitis without obstruction - Primary  Abdominal ultrasound  ordered     Endocrine   Diabetes mellitus (HCC)    Hemoglobin AIC 5.6 patient was started on Januvia 50 mg p.o. daily along with other medication.  She was also advised to lose weight.        Other   Shortness of breath    ) Stable.      Hyperlipidemia LDL goal <70    General advised to follow her diet.  Her labs were reviewed with the she was found to have an elevated triglyceride cholesterol is okay HDL is normal.  He was also advised to exercise on a daily basis.         No orders of the defined types were placed in this encounter.   Follow-up: Return in about 4 weeks (around 06/12/2020).    Corky Downs, MD

## 2020-05-15 NOTE — Assessment & Plan Note (Signed)
Stable

## 2020-05-15 NOTE — Assessment & Plan Note (Signed)
General advised to follow her diet.  Her labs were reviewed with the she was found to have an elevated triglyceride cholesterol is okay HDL is normal.  He was also advised to exercise on a daily basis.

## 2020-05-15 NOTE — Assessment & Plan Note (Signed)
Hemoglobin AIC 5.6 patient was started on Januvia 50 mg p.o. daily along with other medication.  She was also advised to lose weight.

## 2020-05-18 NOTE — Addendum Note (Signed)
Addended by: Alois Cliche on: 05/18/2020 09:18 AM   Modules accepted: Orders

## 2020-05-22 ENCOUNTER — Ambulatory Visit
Admission: RE | Admit: 2020-05-22 | Discharge: 2020-05-22 | Disposition: A | Discharge status: Home / Self Care | Payer: Medicare HMO | Source: Ambulatory Visit | Attending: Internal Medicine | Admitting: Internal Medicine

## 2020-05-22 ENCOUNTER — Other Ambulatory Visit: Payer: Self-pay

## 2020-05-22 DIAGNOSIS — N200 Calculus of kidney: Secondary | ICD-10-CM | POA: Diagnosis not present

## 2020-05-22 DIAGNOSIS — K8020 Calculus of gallbladder without cholecystitis without obstruction: Secondary | ICD-10-CM | POA: Diagnosis not present

## 2020-06-01 DIAGNOSIS — G720 Drug-induced myopathy: Secondary | ICD-10-CM | POA: Insufficient documentation

## 2020-07-23 ENCOUNTER — Other Ambulatory Visit: Payer: Self-pay

## 2020-07-23 MED ORDER — PRALUENT 150 MG/ML ~~LOC~~ SOAJ: 150.0000 mg | SUBCUTANEOUS | 2 refills | Status: DC

## 2020-07-23 NOTE — Telephone Encounter (Signed)
Refill sent for Praluent 150 mg/ml pen injection

## 2020-07-29 ENCOUNTER — Telehealth: Payer: Self-pay | Admitting: Internal Medicine

## 2020-07-29 NOTE — Telephone Encounter (Signed)
Pt c/o medication issue:  1. Name of Medication: Praluent   2. How are you currently taking this medication (dosage and times per day)? 150 mg /ml q 14 d   3. Are you having a reaction (difficulty breathing--STAT)?  no  4. What is your medication issue?  Too expensive in the donut hole and cannot afford

## 2020-07-30 NOTE — Telephone Encounter (Signed)
Patient not approved for assistance

## 2020-07-30 NOTE — Telephone Encounter (Signed)
Spoke to patient and gave her the Praluent Patient Assistance Program number to call as a place to start. [phone].  Advised her to have them screen her and see if she will qualify and to call us back to pursue the next option if needed.  She verbalized understanding.

## 2020-07-30 NOTE — Telephone Encounter (Signed)
Spoke to patient. I will apply for a healthwell grant for patient. Looks like she received one last year about this time but never used it. I have renewed her grant and sent the info to [facility]. [facility]. Fatima Sanger will be active on Sunday. Called patient and made her aware to ask [facility] to process on Sunday. Patient very appreciative of the help.

## 2020-07-30 NOTE — Telephone Encounter (Signed)
Routing to Pharmacy for advice if there are any other options for this patient.

## 2020-08-20 ENCOUNTER — Telehealth: Payer: Self-pay | Admitting: Internal Medicine

## 2020-08-20 NOTE — Telephone Encounter (Signed)
Patient calling in stating she received a questionnaire "adverse event report related to a heart attack and [employer]" Patient unsure how to reply based of when she had her heart attack. Patient is also discussing praluent and wondering if this may interfere with patient assistance  Please advise

## 2020-08-21 ENCOUNTER — Other Ambulatory Visit: Payer: Self-pay | Admitting: Internal Medicine

## 2020-08-21 MED ORDER — METOPROLOL TARTRATE 25 MG PO TABS: 25.0000 mg | ORAL_TABLET | Freq: Two times a day (BID) | ORAL | 0 refills | Status: DC

## 2020-08-21 NOTE — Telephone Encounter (Signed)
Requested Prescriptions   Signed Prescriptions Disp Refills   metoprolol tartrate (LOPRESSOR) 25 MG tablet 180 tablet 0    Sig: Take 1 tablet (25 mg total) by mouth 2 (two) times daily.    Authorizing Provider: Theora Gianotti    Ordering User: Britt Bottom

## 2020-08-21 NOTE — Telephone Encounter (Signed)
*  STAT* If patient is at the pharmacy, call can be transferred to refill team.   1. Which medications need to be refilled? (please list name of each medication and dose if known) metoprolol 25 mg bid  2. Which pharmacy/location (including street and city if local pharmacy) is medication to be sent to? [facility] in [place]  3. Do they need a 30 day or 90 day supply? [place]

## 2020-08-21 NOTE — Telephone Encounter (Signed)
Spoke to patient. She received a questionnaire from [employer] to fill out about if she was on Praluent when she had her heart attack. Patient states she was not until after her heart attack. Advised patient to call the number on the questionnaire if she has further questions or concerns. She will do so.

## 2020-09-29 ENCOUNTER — Other Ambulatory Visit: Payer: Self-pay

## 2020-09-29 ENCOUNTER — Ambulatory Visit (INDEPENDENT_AMBULATORY_CARE_PROVIDER_SITE_OTHER): Payer: Medicare HMO

## 2020-09-29 DIAGNOSIS — Z23 Encounter for immunization: Secondary | ICD-10-CM

## 2020-10-21 ENCOUNTER — Telehealth: Payer: Self-pay

## 2020-10-21 MED ORDER — GLIPIZIDE ER 5 MG PO TB24: 5.0000 mg | ORAL_TABLET | Freq: Every day | ORAL | 3 refills | Status: DC

## 2020-10-21 NOTE — Telephone Encounter (Signed)
Need Glipizid er 5 mg called in to [facility] [place]

## 2020-10-21 NOTE — Telephone Encounter (Signed)
Done

## 2020-11-02 ENCOUNTER — Other Ambulatory Visit
Admission: RE | Admit: 2020-11-02 | Discharge: 2020-11-02 | Disposition: A | Discharge status: Home / Self Care | Payer: Medicare HMO | Attending: Internal Medicine | Admitting: Internal Medicine

## 2020-11-02 DIAGNOSIS — I10 Essential (primary) hypertension: Secondary | ICD-10-CM | POA: Insufficient documentation

## 2020-11-02 DIAGNOSIS — E785 Hyperlipidemia, unspecified: Secondary | ICD-10-CM | POA: Diagnosis not present

## 2020-11-02 LAB — LIPID PANEL
Cholesterol: 168 mg/dL (ref 0–200)
HDL: 54 mg/dL (ref >40)
LDL Cholesterol: UNDETERMINED mg/dL (ref 0–99)
Total CHOL/HDL Ratio: 3.1 RATIO
Triglycerides: 412 mg/dL — ABNORMAL HIGH (ref <150)
VLDL: UNDETERMINED mg/dL (ref 0–40)

## 2020-11-02 LAB — ALT: ALT: 19 U/L (ref 0–44)

## 2020-11-03 ENCOUNTER — Other Ambulatory Visit: Payer: Self-pay | Admitting: *Deleted

## 2020-11-03 LAB — LDL CHOLESTEROL, DIRECT: Direct LDL: 52.4 mg/dL (ref 0–99)

## 2020-11-03 NOTE — Progress Notes (Signed)
Follow-up Outpatient Visit Date: 11/04/2020  Primary Care Provider: Cletis Athens, MD [address] [place] [address]  Chief Complaint: Follow-up CAD  HPI:  Donna Harper is a 69 y.o. female with history of CAD status post PCI to the RCA in setting of NSTEMI (07/2019), HTN, HLD, DM2, TIA, and GERD, who presents for follow-up of CAD.  I last saw her in May, at which time Donna Harper was feeling well.  We agreed to discontinue clopidogrel after completing 12 months of DAPT.  Lipid panel earlier this week showed excellent LDL control but persistent hypertriglyceridemia with triglycerides of 412.  Today, Donna Harper reports that she had been doing well until about a week ago when she developed left-sided jaw and arm pain reminiscent of what she felt at the time of her MI last year.  It lasted for a few hours before subsiding on its own.  She does not recall what she was doing when the pain first started.  She did not take NTG or seek medical attention.  Donna Harper denies chest pain, shortness of breath, palpitations, lightheadedness, and edema.  She remains compliant with her medications and is tolerating them well.  --------------------------------------------------------------------------------------------------  Past Medical History:  Diagnosis Date  . CAD (coronary artery disease)    a. 07/2019 NSTEMI/PCI: LM 15d, LAD 40p, 50/99m, RI nl, LCX nl, OM1 small, RCA large, 30p, 78m (3.0x12 Resolute Onyx DES), RPDA fills via L->R collats (OM1), EF 45-50%.  [name] GERD (gastroesophageal reflux disease)   . Hyperlipidemia LDL goal <70   . Hypertension   . Ischemic cardiomyopathy    a. 07/30/2019 LV gram: EF 45-50%, basal inf HK; b. 07/31/2019 Echo: EF 55-60%, impaired relaxation, sev basal inf HK. Nl RV fxn.   . Joint pain   . Murmur    a. 07/2019 Echo: no significant valvular abnormalities. Mild AoV thickening.  [name] TIA (transient ischemic attack) 08/2014  . Type II diabetes mellitus ([place])    Past  Surgical History:  Procedure Laterality Date  . BREAST BIOPSY Bilateral 1988   benign/Dr Jamal Collin  . CARDIAC CATHETERIZATION    . CESAREAN SECTION  1986  . CORONARY STENT INTERVENTION N/A 07/30/2019   Procedure: CORONARY STENT INTERVENTION;  Surgeon: Nelva Bush, MD;  Location: [place];  Service: Cardiovascular;  Laterality: N/A;  RCA  . LEFT HEART CATH AND CORONARY ANGIOGRAPHY N/A 07/30/2019   Procedure: LEFT HEART CATH AND CORONARY ANGIOGRAPHY;  Surgeon: Nelva Bush, MD;  Location: [facility];  Service: Cardiovascular;  Laterality: N/A;     Recent CV Pertinent Labs: Lab Results  Component Value Date   CHOL 168 11/02/2020   CHOL 258 (H) 07/23/2014   HDL 54 11/02/2020   HDL 33 (L) 07/23/2014   LDLCALC UNABLE TO CALCULATE IF TRIGLYCERIDE OVER 400 mg/dL 11/02/2020   LDLCALC SEE COMMENT 07/23/2014   LDLDIRECT 52.4 11/02/2020   TRIG 412 (H) 11/02/2020   TRIG 640 (H) 07/23/2014   CHOLHDL 3.1 11/02/2020   INR 1.0 07/29/2019   INR 1.0 07/22/2014   K 5.0 05/12/2020   K 4.5 07/22/2014   BUN 19 05/12/2020   BUN 10 07/22/2014   CREATININE 0.88 05/12/2020    Past medical and surgical history were reviewed and updated in EPIC.  Current Meds  Medication Sig  . Alirocumab (PRALUENT) 150 MG/ML SOAJ Inject 150 mg into the skin every 14 (fourteen) days.  [name] aspirin 81 MG tablet Take 81 mg by mouth daily.  . cholecalciferol (  VITAMIN D) 1000 UNITS tablet Take 1,000 Units by mouth daily.  . diphenhydrAMINE (BENADRYL) 25 MG tablet Take 25 mg by mouth at bedtime.  [name] glipiZIDE (GLUCOTROL XL) 5 MG 24 hr tablet Take 1 tablet (5 mg total) by mouth daily.  [name] losartan (COZAAR) 100 MG tablet Take 100 mg by mouth daily.  . metFORMIN (GLUCOPHAGE) 1000 MG tablet Take 1,000 mg by mouth 2 (two) times daily with a meal.  . metoprolol tartrate (LOPRESSOR) 50 MG tablet Take 1 tablet (50 mg total) by mouth 2 (two) times daily.  . Multiple Vitamin (MULTIVITAMIN WITH MINERALS) TABS  tablet Take 1 tablet by mouth daily.  [name] NASONEX 50 MCG/ACT nasal spray Place 2 sprays into the nose daily.   . nitroGLYCERIN (NITROSTAT) 0.4 MG SL tablet Place 1 tablet (0.4 mg total) under the tongue every 5 (five) minutes as needed for chest pain.  . sitaGLIPtin (JANUVIA) 50 MG tablet Take 1 tablet (50 mg total) by mouth daily.  . vitamin E 400 UNIT capsule Take 400 Units by mouth daily.  . [DISCONTINUED] metoprolol tartrate (LOPRESSOR) 25 MG tablet Take 1 tablet (25 mg total) by mouth 2 (two) times daily.    Allergies: Atorvastatin, Erythromycin, Levaquin [levofloxacin in d5w], Penicillins, Rosuvastatin, Simvastatin, Tequin [gatifloxacin], Zoloft [sertraline hcl], and Zithromax [azithromycin]  Social History   Tobacco Use  . Smoking status: Former Research scientist (life sciences)  . Smokeless tobacco: Never Used  . Tobacco comment: Quit 30 years ago  Vaping Use  . Vaping Use: Never used  Substance Use Topics  . Alcohol use: No    Alcohol/week: 0.0 standard drinks  . Drug use: No    Family History  Problem Relation Age of Onset  . COPD Mother   . COPD Father     Review of Systems: A 12-system review of systems was performed and was negative except as noted in the HPI.  --------------------------------------------------------------------------------------------------  Physical Exam: BP 120/66 (BP Location: Left Arm, Patient Position: Sitting, Cuff Size: Normal)   Pulse 81   Ht 5\' 3"  (1.6 m)   Wt 155 lb (70.3 kg)   SpO2 98%   BMI 27.46 kg/m   General: NAD. Neck: No JVD or HJR. Lungs: Clear to auscultation bilaterally without wheezes or crackles. Heart: Regular rate and rhythm without murmurs, rubs, or gallops. Abdomen: Soft, nontender, nondistended. Extremities: No lower extremity edema.  EKG: Normal sinus rhythm with low voltage.  Otherwise, no significant abnormality.  No change from prior tracing on 04/29/2020.  Lab Results  Component Value Date   WBC 5.2 05/12/2020   HGB 12.1  05/12/2020   HCT 36.3 05/12/2020   MCV 89.2 05/12/2020   PLT 174 05/12/2020    Lab Results  Component Value Date   NA 140 05/12/2020   K 5.0 05/12/2020   CL 108 05/12/2020   CO2 19 (L) 05/12/2020   BUN 19 05/12/2020   CREATININE 0.88 05/12/2020   GLUCOSE 149 (H) 05/12/2020   ALT 19 11/02/2020    Lab Results  Component Value Date   CHOL 168 11/02/2020   HDL 54 11/02/2020   LDLCALC UNABLE TO CALCULATE IF TRIGLYCERIDE OVER 400 mg/dL 11/02/2020   LDLDIRECT 52.4 11/02/2020   TRIG 412 (H) 11/02/2020   CHOLHDL 3.1 11/02/2020    --------------------------------------------------------------------------------------------------  ASSESSMENT AND PLAN: Coronary artery disease with angina: Donna Harper reports one episode of left jaw and arm pain at restreminiscent of what she felt at the time of her MI last year.  She had moderate LAD  disease that was not intervened upon at that time.  She has not had any exertional symptoms, though she does not push herself very hard.  I am concerned that the symptoms reflect her anginal equivalent.  She has not had symptoms at other times and exhibits a stable EKG without ischemic changes today.  We have agreed to obtain a pharmacologic myocardial perfusion stress test at her earliest convenience.  We have discussed the risks and benefits of this procedure, and Donna Harper is willing to proceed.  In the meantime, we will increase metoprolol tartrate to 50 mg twice daily.  I advised Donna Harper to seek immediate medical attention if she has recurrent angina.  Hyperlipidemia: LDL well controlled on recent labs, though triglycerides are still quite elevated at 412.  We discussed lifestyle modifications to help lower her triglycerides.  I will also reach out to the lipid clinic to see if Donna Harper would qualify for assistance with Vascepa.  She should continue with her current dose of Praluent.  Ischemic cardiomyopathy: Donna Harper appears euvolemic and well  compensated today.  Continue current dose of losartan.  We will increase metoprolol, as outlined above, for antianginal therapy.  Follow-up: Return to clinic in 1 month.  Nelva Bush, MD 11/04/2020 1:11 PM

## 2020-11-04 ENCOUNTER — Encounter: Payer: Self-pay | Admitting: Internal Medicine

## 2020-11-04 ENCOUNTER — Ambulatory Visit: Payer: Medicare HMO | Admitting: Internal Medicine

## 2020-11-04 ENCOUNTER — Other Ambulatory Visit: Payer: Self-pay

## 2020-11-04 VITALS — BP 120/66 | HR 81 | Ht 63.0 in | Wt 155.0 lb

## 2020-11-04 DIAGNOSIS — I255 Ischemic cardiomyopathy: Secondary | ICD-10-CM | POA: Diagnosis not present

## 2020-11-04 DIAGNOSIS — E782 Mixed hyperlipidemia: Secondary | ICD-10-CM

## 2020-11-04 DIAGNOSIS — I25119 Atherosclerotic heart disease of native coronary artery with unspecified angina pectoris: Secondary | ICD-10-CM | POA: Diagnosis not present

## 2020-11-04 MED ORDER — METOPROLOL TARTRATE 50 MG PO TABS
50.0000 mg | ORAL_TABLET | Freq: Two times a day (BID) | ORAL | 5 refills | Status: DC
Start: 2020-11-04 — End: 2021-05-03

## 2020-11-04 NOTE — Patient Instructions (Signed)
Medication Instructions:   Your physician has recommended you make the following change in your medication:   1.  INCREASE your Metoprolol Tartrate ( Lopressor) to 50 MG: Take 1 tab by mouth 2X a day.   *If you need a refill on your cardiac medications before your next appointment, please call your pharmacy*   Lab Work: None Ordered If you have labs (blood work) drawn today and your tests are completely normal, you will receive your results only by: [name] MyChart Message (if you have MyChart) OR . A paper copy in the mail If you have any lab test that is abnormal or we need to change your treatment, we will call you to review the results.   Testing/Procedures:   [place]       Your caregiver has ordered a Stress Test with nuclear imaging. The purpose of this test is to evaluate the blood supply to your heart muscle. This procedure is referred to as a "Non-Invasive Stress Test." This is because other than having an IV started in your vein, nothing is inserted or "invades" your body. Cardiac stress tests are done to find areas of poor blood flow to the heart by determining the extent of coronary artery disease (CAD). Some patients exercise on a treadmill, which naturally increases the blood flow to your heart, while others who are  unable to walk on a treadmill due to physical limitations have a pharmacologic/chemical stress agent called Lexiscan . This medicine will mimic walking on a treadmill by temporarily increasing your coronary blood flow.      PLEASE REPORT TO [facility] MEDICAL MALL ENTRANCE   THE VOLUNTEERS AT THE FIRST DESK WILL DIRECT YOU WHERE TO GO     *Please note: these test may take anywhere between 2-4 hours to complete       Date of Procedure:_____________________________________   Arrival Time for Procedure:______________________________    PLEASE NOTIFY THE OFFICE AT LEAST 24 HOURS IN ADVANCE IF YOU ARE UNABLE TO KEEP YOUR APPOINTMENT.  [place] 24 HOURS IN ADVANCE IF YOU ARE UNABLE TO KEEP YOUR APPOINTMENT. [phone]       How to prepare for your Myoview test:         _ XX___:  Hold diabetes medication the morning of procedure:   metFORMIN (GLUCOPHAGE), glipiZIDE (GLUCOTROL XL) _XX___:  Hold betablocker(s) night before procedure and morning of procedure:   metoprolol tartrate (LOPRESSOR)     1. Do not eat or drink after midnight  2. No caffeine for 24 hours prior to test  3. No smoking 24 hours prior to test.  4. Unless instructed otherwise, Take your medication with a small sips of water.    5.         Ladies, please do not wear dresses. Skirts or pants are appropriate. Please wear a short sleeve shirt.  6. No perfume, cologne or lotion.  7. Wear comfortable walking shoes. No heels!     Follow-Up: At [facility], you and your health needs are our priority.  As part of our continuing mission to provide you with exceptional heart care, we have created designated Provider Care Teams.  These Care Teams include your primary Cardiologist (physician) and Advanced Practice Providers (APPs -  Physician Assistants and Nurse Practitioners) who all work together to provide you with the care you need, when you need it.  We recommend signing up for the patient portal called "MyChart".  Sign up  information is provided on this After Visit Summary.  MyChart is used to connect with patients for Virtual Visits (Telemedicine).  Patients are able to view lab/test results, encounter notes, upcoming appointments, etc.  Non-urgent messages can be sent to your provider as well.   To learn more about what you can do with MyChart, go to [email or web address].    Your next appointment:   1 month(s)  The format for your next appointment:   In Person  Provider:   You may see Nelva Bush, MD or one of the following Advanced Practice Providers on your designated Care Team:    Murray Hodgkins,  NP  Christell Faith, PA-C  Marrianne Mood, PA-C  Cadence [place], [place]  Laurann Montana, NP    Other Instructions

## 2020-11-11 ENCOUNTER — Telehealth: Payer: Self-pay | Admitting: *Deleted

## 2020-11-11 DIAGNOSIS — I10 Essential (primary) hypertension: Secondary | ICD-10-CM

## 2020-11-11 DIAGNOSIS — E785 Hyperlipidemia, unspecified: Secondary | ICD-10-CM

## 2020-11-11 DIAGNOSIS — I2510 Atherosclerotic heart disease of native coronary artery without angina pectoris: Secondary | ICD-10-CM

## 2020-11-11 MED ORDER — ICOSAPENT ETHYL 1 G PO CAPS: 2.0000 g | ORAL_CAPSULE | Freq: Two times a day (BID) | ORAL | 6 refills | Status: DC

## 2020-11-11 NOTE — Telephone Encounter (Signed)
Patient agreeable to try the Vascepa. She is concerned about the price and says she cannot afford it if it costs as much as the Praluent did. I assured her that it should be covered by her [place] and that it will be noted on the prescription for the pharmacist.  She is also aware to get repeat lab work in 3 months at the [facility] and to be fasting.

## 2020-11-11 NOTE — Telephone Encounter (Signed)
-----   Message from Nelva Bush, MD sent at 11/11/2020  1:26 PM EST ----- Regarding: Vascepa Hi Adessa Primiano,Could you reach out to Ms. Kump and let her know that I have spoken with the lipid clinic?  I recommend that we start Vascepa 2 gm BID.  Please include the information regarding secondary insurance on the prescription, as detailed below.  We should recheck a fasting lipid panel and ALT in about 3 months.  Let me know if any questions come up.  Thanks.Gerald Stabs ----- Message ----- From: Ramond Dial, RPH-CPP Sent: 11/09/2020   7:02 AM EST To: Nelva Bush, MD  You can put on the prescription please bill [employer] grant on file as secondary- the pharmacy may or may not see it. I will also resend the grant info to the pharmacy asking them to apply it to the vascepa Rx. It looks to me like her plan covers brand. So you might want to check the dispense as written button (the pharmacy will figure out which one insurance will cover). Attempted to do a PA, but none is needed.  [name] ----- Message ----- From: Nelva Bush, MD Sent: 11/06/2020  12:53 PM EST To: Ramond Dial, RPH-CPP, [facility] Pharm[facility]  Hi [name],Thanks for your help.  Do I need to do something special to prescribe Vascepa using her grant or do I simply send a prescription to her pharmacy and they will be able to take care of the billing?  Gerald Stabs ----- Message ----- From: Ramond Dial, RPH-CPP Sent: 11/04/2020   1:26 PM EST To: Nelva Bush, MD, Cv Div Pharmd  Yes, her grant would cover Vascepa. Her grant is good until August. Currently we cannot renew peoples grant, but hopefully by august there will be funds. She can use up what she was already awarded though.  Fenofibrate is going to give you better TG lowering (20-50%), I do see that her TG are close to 500. She will get some microvascular benefit with fenofibrate due to having DM, but not cardiovascular.  Vascepa will give you less TG lowering (20-30%),  but decent chance it will give you some cardiovascular protection (some question the Reduce it trial)  My guess is she would also benefit from nutrition counseling.  [name]   ----- Message ----- From: Nelva Bush, MD Sent: 11/04/2020   1:19 PM EST To: Cv Div Pharmd  Hello,Ms. Stice was previously seen in the lipid clinic (I believe by Dr. Debara Pickett) and is on Praluent with assistance of a grant.  Do you know if that grant would also cover Vascepa, or if there are other options available to help with her triglycerides?  Thanks.  Gerald Stabs

## 2020-11-13 ENCOUNTER — Telehealth: Payer: Self-pay | Admitting: *Deleted

## 2020-11-13 NOTE — Telephone Encounter (Signed)
Received fax from pharmacy requesting Rx change. Icosapent Ethyl Capsules are not covered by patient's plan. The preferred alternative is Vascepa cap. Please advise if change is appropriate and send to pharmacy on file.

## 2020-11-16 ENCOUNTER — Other Ambulatory Visit: Payer: Self-pay

## 2020-11-16 ENCOUNTER — Encounter
Admission: RE | Admit: 2020-11-16 | Discharge: 2020-11-16 | Disposition: A | Discharge status: Home / Self Care | Payer: Medicare HMO | Source: Ambulatory Visit | Attending: Internal Medicine | Admitting: Internal Medicine

## 2020-11-16 DIAGNOSIS — I25119 Atherosclerotic heart disease of native coronary artery with unspecified angina pectoris: Secondary | ICD-10-CM | POA: Diagnosis not present

## 2020-11-16 LAB — NM MYOCAR MULTI W/SPECT W/WALL MOTION / EF
LV dias vol: 32 mL (ref 46–106)
LV sys vol: 16 mL
Peak HR: 117 {beats}/min
Percent HR: 77 %
Rest HR: 81 {beats}/min
SDS: 0
SRS: 0
SSS: 0
TID: 0.75

## 2020-11-16 MED ORDER — TECHNETIUM TC 99M TETROFOSMIN IV KIT
10.3680 | PACK | Freq: Once | INTRAVENOUS | Status: AC | PRN
  Administered 2020-11-16: 10.368 via INTRAVENOUS

## 2020-11-16 MED ORDER — TECHNETIUM TC 99M TETROFOSMIN IV KIT
30.0000 | PACK | Freq: Once | INTRAVENOUS | Status: AC | PRN
  Administered 2020-11-16: 30.803 via INTRAVENOUS

## 2020-11-16 MED ORDER — REGADENOSON 0.4 MG/5ML IV SOLN
0.4000 mg | Freq: Once | INTRAVENOUS | Status: AC
  Administered 2020-11-16: 0.4 mg via INTRAVENOUS
  Filled 2020-11-16: qty 5

## 2020-11-16 NOTE — Telephone Encounter (Signed)
Spoke to pharmacist. She re-ran the prescription with [employer] and everything went through fine. Nothing else needed at this time.

## 2020-11-17 ENCOUNTER — Telehealth: Payer: Self-pay | Admitting: *Deleted

## 2020-11-17 NOTE — Telephone Encounter (Signed)
-----   Message from Nelva Bush, MD sent at 11/17/2020  6:46 AM EST ----- Please let Donna Harper know that her stress test is normal.  I recommend that she continue her current medications and follow-up with [place] as previously arranged to reassess her symptoms.  If she has worsening chest pain in the meantime, she should let us know or seek immediate medical attention.

## 2020-11-17 NOTE — Telephone Encounter (Signed)
No answer. Left detailed message with results, ok per DPR, and to call back if any questions.  

## 2020-11-17 NOTE — Telephone Encounter (Signed)
Called [facility]- confirmed 0$ copay. Called pt and made her aware. Im not sure why they told her $400 as her grant was only charged $91.93

## 2020-11-17 NOTE — Telephone Encounter (Signed)
Patient calling  States she was told that she would have to pay $400 for Vascepa even with the grant  Patient cannot afford and would like to discuss other options Please call to discuss

## 2020-11-17 NOTE — Telephone Encounter (Signed)
Hi [name],  Do you have any recommendations on helping patient with affording Vascepa?

## 2020-12-05 ENCOUNTER — Other Ambulatory Visit: Payer: Self-pay | Admitting: Internal Medicine

## 2020-12-16 ENCOUNTER — Other Ambulatory Visit: Payer: Self-pay

## 2020-12-16 ENCOUNTER — Ambulatory Visit (INDEPENDENT_AMBULATORY_CARE_PROVIDER_SITE_OTHER): Payer: Medicare HMO | Admitting: Nurse Practitioner

## 2020-12-16 ENCOUNTER — Encounter: Payer: Self-pay | Admitting: Nurse Practitioner

## 2020-12-16 VITALS — BP 120/68 | HR 68 | Ht 63.0 in | Wt 150.0 lb

## 2020-12-16 DIAGNOSIS — I10 Essential (primary) hypertension: Secondary | ICD-10-CM

## 2020-12-16 DIAGNOSIS — E781 Pure hyperglyceridemia: Secondary | ICD-10-CM

## 2020-12-16 DIAGNOSIS — E785 Hyperlipidemia, unspecified: Secondary | ICD-10-CM | POA: Diagnosis not present

## 2020-12-16 DIAGNOSIS — I2510 Atherosclerotic heart disease of native coronary artery without angina pectoris: Secondary | ICD-10-CM

## 2020-12-16 DIAGNOSIS — I255 Ischemic cardiomyopathy: Secondary | ICD-10-CM

## 2020-12-16 NOTE — Progress Notes (Signed)
Office Visit    Patient Name: Donna Harper Date of Encounter: 12/16/2020  Primary Care Provider:  Cletis Athens, MD Primary Cardiologist:  Nelva Bush, MD  Chief Complaint    70 year old female with a history of CAD status post non-STEMI and RCA stenting in August 2020, hypertension, hyperlipidemia, diabetes, TIA, and GERD, who presents for follow-up of CAD.  Past Medical History    Past Medical History:  Diagnosis Date  . CAD (coronary artery disease)    a. 07/2019 NSTEMI/PCI: LM 15d, LAD 40p, 50/15m, RI nl, LCX nl, OM1 small, RCA large, 30p, 47m (3.0x12 Resolute Onyx DES), RPDA fills via L->R collats (OM1), EF 45-50%; b. 11/2020 MV: EF>65%, no ischemia/infact-->low risk.  [name] GERD (gastroesophageal reflux disease)   . Hyperlipidemia LDL goal <70   . Hypertension   . Ischemic cardiomyopathy    a. 07/30/2019 LV gram: EF 45-50%, basal inf HK; b. 07/31/2019 Echo: EF 55-60%, impaired relaxation, sev basal inf HK. Nl RV fxn.   . Joint pain   . Murmur    a. 07/2019 Echo: no significant valvular abnormalities. Mild AoV thickening.  [name] TIA (transient ischemic attack) 08/2014  . Type II diabetes mellitus ([place])    Past Surgical History:  Procedure Laterality Date  . BREAST BIOPSY Bilateral 1988   benign/Dr Jamal Collin  . CARDIAC CATHETERIZATION    . CESAREAN SECTION  1986  . CORONARY STENT INTERVENTION N/A 07/30/2019   Procedure: CORONARY STENT INTERVENTION;  Surgeon: Nelva Bush, MD;  Location: [facility];  Service: Cardiovascular;  Laterality: N/A;  RCA  . LEFT HEART CATH AND CORONARY ANGIOGRAPHY N/A 07/30/2019   Procedure: LEFT HEART CATH AND CORONARY ANGIOGRAPHY;  Surgeon: Nelva Bush, MD;  Location: [facility];  Service: Cardiovascular;  Laterality: N/A;    Allergies  Allergies  Allergen Reactions  . Atorvastatin     Myalgias  . Erythromycin     Stomach Problems'   . Levaquin [Levofloxacin In D5w]   . Penicillins     Heart races  .  Rosuvastatin     Myalgias  . Simvastatin     Myalgias  . Tequin [Gatifloxacin]   . Zoloft [Sertraline Hcl]   . Zithromax [Azithromycin] Palpitations    Heart race    History of Present Illness    70 year old female with the above past medical history including CAD, hypertension, hyperlipidemia, TIA, diabetes, and GERD.  In August 2020, she was admitted to [facility] with exertional chest pain.  She ruled in for non-STEMI and underwent catheterization which showed moderate nonobstructive LAD disease and a 95% stenosis in the mid RCA.  The RCA was successfully treated with a drug-eluting stent.  EF was 45 to 50% on ventriculography but 55 to 60% with severe basal inferior hypokinesis on echo.  She did well post PCI but at her last clinic visit in November 2021, she reported an episode of left-sided jaw and arm pain that lasted a few hours and resolve spontaneously.  Symptoms were reminiscent of prior anginal symptoms.  Stress testing was undertaken in December 2021, and showed no evidence of ischemia or infarct, with normal LV function.  Since her last visit, she has felt well.  She continues to walk about 1 mile 3-4x/wk and denies any symptoms or limitations.  She was prev Rx vascepa after her Nov labs revealed TG of 412, however, she had yet to start it, due to concern over potential side effects.  She did not previously tolerate fish  oil (diarrhea) and is concerned she might experience the same w/ vascepa.  Instead however, she has cut out simple carbohydrates and most fats from her diet.  She is down 5 pounds on her scale today.  She is hopeful that with significant lifestyle modifications, she can bring down her triglycerides on her own and prefers to hold off on starting Vascepa at this time.  She denies chest pain, dyspnea, palpitations, PND, orthopnea, dizziness, syncope, edema, or early satiety.  Home Medications    Prior to Admission medications   Medication Sig Start Date End Date  Taking? Authorizing Provider  Alirocumab (PRALUENT) 150 MG/ML SOAJ Inject 150 mg into the skin every 14 (fourteen) days. 07/23/20   Creig Hines, NP  aspirin 81 MG tablet Take 81 mg by mouth daily.    [provider]  cholecalciferol (VITAMIN D) 1000 UNITS tablet Take 1,000 Units by mouth daily.    [provider]  diphenhydrAMINE (BENADRYL) 25 MG tablet Take 25 mg by mouth at bedtime.    [provider]  glipiZIDE (GLUCOTROL XL) 5 MG 24 hr tablet Take 1 tablet (5 mg total) by mouth daily. 10/21/20   Irish Lack, FNP  icosapent Ethyl (VASCEPA) 1 g capsule Take 2 capsules (2 g total) by mouth 2 (two) times daily. Please bill to [employer] on file as secondary insurance. 11/11/20   End, Cristal Deer, MD  losartan (COZAAR) 100 MG tablet TAKE 1 TABLET BY MOUTH DAILY 12/14/20   Corky Downs, MD  metFORMIN (GLUCOPHAGE) 1000 MG tablet Take 1,000 mg by mouth 2 (two) times daily with a meal.    [provider]  metoprolol tartrate (LOPRESSOR) 50 MG tablet Take 1 tablet (50 mg total) by mouth 2 (two) times daily. 11/04/20   End, Cristal Deer, MD  Multiple Vitamin (MULTIVITAMIN WITH MINERALS) TABS tablet Take 1 tablet by mouth daily.    [provider]  NASONEX 50 MCG/ACT nasal spray Place 2 sprays into the nose daily.  10/29/14   [provider]  nitroGLYCERIN (NITROSTAT) 0.4 MG SL tablet Place 1 tablet (0.4 mg total) under the tongue every 5 (five) minutes as needed for chest pain. 08/14/19 04/29/29  Creig Hines, NP  sitaGLIPtin (JANUVIA) 50 MG tablet Take 1 tablet (50 mg total) by mouth daily. 05/12/20   Corky Downs, MD  vitamin E 400 UNIT capsule Take 400 Units by mouth daily.    [provider]    Review of Systems    She denies chest pain, palpitations, dyspnea, pnd, orthopnea, n, v, dizziness, syncope, edema, weight gain, or early satiety.  All other systems reviewed and are otherwise negative except as noted  above.  Physical Exam    VS:  BP 120/68 (BP Location: Left Arm, Patient Position: Sitting, Cuff Size: Normal)   Pulse 68   Ht 5\' 3"  (1.6 m)   Wt 150 lb (68 kg)   SpO2 95%   BMI 26.57 kg/m  , BMI Body mass index is 26.57 kg/m. GEN: Well nourished, well developed, in no acute distress. HEENT: normal. Neck: Supple, no JVD, carotid bruits, or masses. Cardiac: RRR, I/VI syst murmur @ the upper sternal borders and left lower sternal border, no rubs, or gallops. No clubbing, cyanosis, edema.  Radials/PT 2+ and equal bilaterally.  Respiratory:  Respirations regular and unlabored, clear to auscultation bilaterally. GI: Soft, nontender, nondistended, BS + x 4. MS: no deformity or atrophy. Skin: warm and dry, no rash. Neuro:  Strength and sensation are intact.  Psych: Normal affect.  Accessory Clinical Findings    Lab Results  Component Value Date   WBC 5.2 05/12/2020   HGB 12.1 05/12/2020   HCT 36.3 05/12/2020   MCV 89.2 05/12/2020   PLT 174 05/12/2020   Lab Results  Component Value Date   CREATININE 0.88 05/12/2020   BUN 19 05/12/2020   NA 140 05/12/2020   K 5.0 05/12/2020   CL 108 05/12/2020   CO2 19 (L) 05/12/2020   Lab Results  Component Value Date   ALT 19 11/02/2020   AST 19 05/12/2020   ALKPHOS 56 10/29/2019   BILITOT 0.5 05/12/2020   Lab Results  Component Value Date   CHOL 168 11/02/2020   HDL 54 11/02/2020   LDLCALC UNABLE TO CALCULATE IF TRIGLYCERIDE OVER 400 mg/dL 11/02/2020   LDLDIRECT 52.4 11/02/2020   TRIG 412 (H) 11/02/2020   CHOLHDL 3.1 11/02/2020    Lab Results  Component Value Date   HGBA1C 5.9 (H) 05/12/2020    Assessment & Plan    1.  Coronary artery disease: Status post prior non-STEMI and RCA stenting in August 2020.  Recent low risk Myoview in December 2021.  She has not had any chest pain or dyspnea since her last visit.  She is walking at least 30 minutes 3 times or more a week.  She remains on aspirin, beta-blocker, ARB, and Praluent  therapy.  She was prescribed Vascepa in the setting of ongoing hypertriglyceridemia at her last visit however, she has yet to start.  She has instituted significant lifestyle and diet modifications and is down 5 pounds.  She would like to try and continue with lifestyle changes for the time being.  2.  Essential hypertension: Stable on beta-blocker and ARB therapy.  3.  Hyperlipidemia/hypertriglyceridemia: She is statin intolerant and on Praluent with a direct LDL of 52.4 in November 2021.  Triglycerides were elevated at that time at 412.  She was prescribed Vascepa but after reading the side effects, she has concerns and has not started yet.  She has made dietary and lifestyle modifications and her weight is down 5 pounds.  We discussed the role of Vascepa in the patient with coronary artery disease and hypertriglyceridemia.  We also discussed the role of lifestyle modifications and impact these might have on weight, blood pressure, cholesterol numbers, and overall risk.  She plans to continue with aggressive lifestyle modifications for the time being.  We can plan to follow-up lipids in about 6 months to reassess triglycerides.  4.  Ischemic cardiomyopathy: EF 45 to 50% by left ventriculography at the time of her non-STEMI in August 2020 with subsequent echo showing EF of 55 to 60% with severe basal inferior hypokinesis.  She remains euvolemic and overall well compensated.  Continue beta-blocker and ARB therapy.    5.  Type 2 diabetes mellitus: A1c was 5.9 in June.  She notes that she has cut out simple sugars and is no longer drinking sweet tea throughout the day.  She is on Metformin, Glucotrol, and Januvia therapy.  6.  Disposition: Follow-up in clinic in 6 months or sooner if necessary.   Murray Hodgkins, NP 12/16/2020, 12:08 PM

## 2020-12-16 NOTE — Patient Instructions (Signed)
Medication Instructions:   Your physician recommends that you continue on your current medications as directed. Please refer to the Current Medication list given to you today.  *If you need a refill on your cardiac medications before your next appointment, please call your pharmacy*   Lab Work: None ordered    Testing/Procedures: None ordered   Follow-Up: At [facility], you and your health needs are our priority.  As part of our continuing mission to provide you with exceptional heart care, we have created designated Provider Care Teams.  These Care Teams include your primary Cardiologist (physician) and Advanced Practice Providers (APPs -  Physician Assistants and Nurse Practitioners) who all work together to provide you with the care you need, when you need it.  We recommend signing up for the patient portal called "MyChart".  Sign up information is provided on this After Visit Summary.  MyChart is used to connect with patients for Virtual Visits (Telemedicine).  Patients are able to view lab/test results, encounter notes, upcoming appointments, etc.  Non-urgent messages can be sent to your provider as well.   To learn more about what you can do with MyChart, go to [email or web address].    Your next appointment:   6 month(s)  The format for your next appointment:   In Person  Provider:   You may see Christopher End, MD or one of the following Advanced Practice Providers on your designated Care Team:    Christopher Berge, NP   

## 2020-12-19 ENCOUNTER — Other Ambulatory Visit: Payer: Self-pay | Admitting: Internal Medicine

## 2021-02-08 ENCOUNTER — Other Ambulatory Visit: Payer: Self-pay | Admitting: *Deleted

## 2021-02-08 ENCOUNTER — Encounter: Payer: Self-pay | Admitting: Internal Medicine

## 2021-02-08 ENCOUNTER — Ambulatory Visit (INDEPENDENT_AMBULATORY_CARE_PROVIDER_SITE_OTHER): Payer: Medicare HMO | Admitting: Internal Medicine

## 2021-02-08 ENCOUNTER — Other Ambulatory Visit: Payer: Self-pay

## 2021-02-08 VITALS — BP 139/73 | HR 71 | Ht 64.0 in | Wt 147.0 lb

## 2021-02-08 DIAGNOSIS — I2510 Atherosclerotic heart disease of native coronary artery without angina pectoris: Secondary | ICD-10-CM

## 2021-02-08 DIAGNOSIS — R0602 Shortness of breath: Secondary | ICD-10-CM

## 2021-02-08 DIAGNOSIS — E782 Mixed hyperlipidemia: Secondary | ICD-10-CM | POA: Diagnosis not present

## 2021-02-08 DIAGNOSIS — I10 Essential (primary) hypertension: Secondary | ICD-10-CM

## 2021-02-08 DIAGNOSIS — I255 Ischemic cardiomyopathy: Secondary | ICD-10-CM

## 2021-02-08 MED ORDER — METFORMIN HCL 1000 MG PO TABS
1000.0000 mg | ORAL_TABLET | Freq: Two times a day (BID) | ORAL | 3 refills | Status: DC
Start: 2021-02-08 — End: 2022-01-31

## 2021-02-08 NOTE — Assessment & Plan Note (Signed)
Resolved because of the loss of weight

## 2021-02-08 NOTE — Assessment & Plan Note (Signed)
Stable at the present time no paroxysmal nocturnal dyspnea or swelling of the legs.

## 2021-02-08 NOTE — Assessment & Plan Note (Signed)
Patient blood pressure is normal patient denies any chest pain or shortness of breath there is no history of palpitation paroxysmal nocturnal dyspnea patient can walk 1 blocks without any problem patient was advised to follow low-salt low-cholesterol diet

## 2021-02-08 NOTE — Assessment & Plan Note (Signed)
Patient is not having any angina she is following low-cholesterol low triglyceride diet.

## 2021-02-08 NOTE — Progress Notes (Signed)
Established Patient Office Visit  Subjective:  Patient ID: Donna Harper, female    DOB: December 15, 1950  Age: 70 y.o. MRN: [id number]  CC:  Chief Complaint  Patient presents with  . Diabetes    Patients blood sugar was 116     HPI  Donna Harper presents  for general checkup.  Patient has a history of non ST myocardial infarction.  And has a stent placement she also has known to have ischemic cardiomyopathy hypertension and calculus of the gallbladder without any cholecystitis.  Patient diabetes is under control.  She cannot tolerate any kind of fish oil because of allergy.  So has been following a strict low-carb diet.  Patient was suggested to have a mammogram done her lipids are under control.  She is taking aspirin vitamin D glipizide losartan and metoprolol.  Past Medical History:  Diagnosis Date  . CAD (coronary artery disease)    a. 07/2019 NSTEMI/PCI: LM 15d, LAD 40p, 50/28m, RI nl, LCX nl, OM1 small, RCA large, 30p, 9m (3.0x12 Resolute Onyx DES), RPDA fills via L->R collats (OM1), EF 45-50%; b. 11/2020 MV: EF>65%, no ischemia/infact-->low risk.  [name] GERD (gastroesophageal reflux disease)   . Hyperlipidemia LDL goal <70   . Hypertension   . Ischemic cardiomyopathy    a. 07/30/2019 LV gram: EF 45-50%, basal inf HK; b. 07/31/2019 Echo: EF 55-60%, impaired relaxation, sev basal inf HK. Nl RV fxn.   . Joint pain   . Murmur    a. 07/2019 Echo: no significant valvular abnormalities. Mild AoV thickening.  [name] TIA (transient ischemic attack) 08/2014  . Type II diabetes mellitus (HCC)     Past Surgical History:  Procedure Laterality Date  . BREAST BIOPSY Bilateral 1988   benign/Dr Evette Cristal  . CARDIAC CATHETERIZATION    . CESAREAN SECTION  1986  . CORONARY STENT INTERVENTION N/A 07/30/2019   Procedure: CORONARY STENT INTERVENTION;  Surgeon: [name], MD;  Location: [facility];  Service: Cardiovascular;  Laterality: N/A;  RCA  . LEFT HEART CATH AND CORONARY ANGIOGRAPHY  N/A 07/30/2019   Procedure: LEFT HEART CATH AND CORONARY ANGIOGRAPHY;  Surgeon: [name], MD;  Location: [facility];  Service: Cardiovascular;  Laterality: N/A;    Family History  Problem Relation Age of Onset  . COPD Mother   . COPD Father     Social History   Socioeconomic History  . Marital status: Married    Spouse name: Not on file  . Number of children: Not on file  . Years of education: Not on file  . Highest education level: Not on file  Occupational History  . Not on file  Tobacco Use  . Smoking status: Former Games developer  . Smokeless tobacco: Never Used  . Tobacco comment: Quit 30 years ago  Vaping Use  . Vaping Use: Never used  Substance and Sexual Activity  . Alcohol use: No    Alcohol/week: 0.0 standard drinks  . Drug use: No  . Sexual activity: Not on file  Other Topics Concern  . Not on file  Social History Narrative  . Not on file   Social Determinants of Health   Financial Resource Strain: Not on file  Food Insecurity: Not on file  Transportation Needs: Not on file  Physical Activity: Not on file  Stress: Not on file  Social Connections: Not on file  Intimate Partner Violence: Not on file     Current Outpatient Medications:  .  Alirocumab (PRALUENT) 150  MG/ML SOAJ, Inject 150 mg into the skin every 14 (fourteen) days., Disp: 14 mL, Rfl: 2 .  aspirin 81 MG tablet, Take 81 mg by mouth daily., Disp: , Rfl:  .  cholecalciferol (VITAMIN D) 1000 UNITS tablet, Take 1,000 Units by mouth daily., Disp: , Rfl:  .  diphenhydrAMINE (BENADRYL) 25 MG tablet, Take 25 mg by mouth at bedtime., Disp: , Rfl:  .  glipiZIDE (GLUCOTROL XL) 5 MG 24 hr tablet, Take 1 tablet (5 mg total) by mouth daily., Disp: 90 tablet, Rfl: 3 .  icosapent Ethyl (VASCEPA) 1 g capsule, Take 2 capsules (2 g total) by mouth 2 (two) times daily. Please bill to [employer] on file as secondary insurance., Disp: 120 capsule, Rfl: 6 .  losartan (COZAAR) 100 MG tablet, TAKE 1  TABLET BY MOUTH DAILY, Disp: 90 tablet, Rfl: 1 .  metoprolol tartrate (LOPRESSOR) 50 MG tablet, Take 1 tablet (50 mg total) by mouth 2 (two) times daily., Disp: 60 tablet, Rfl: 5 .  Multiple Vitamin (MULTIVITAMIN WITH MINERALS) TABS tablet, Take 1 tablet by mouth daily., Disp: , Rfl:  .  NASONEX 50 MCG/ACT nasal spray, Place 2 sprays into the nose daily. , Disp: , Rfl: 0 .  nitroGLYCERIN (NITROSTAT) 0.4 MG SL tablet, Place 1 tablet (0.4 mg total) under the tongue every 5 (five) minutes as needed for chest pain., Disp: 90 tablet, Rfl: 3 .  sitaGLIPtin (JANUVIA) 50 MG tablet, Take 1 tablet (50 mg total) by mouth daily., Disp: 30 tablet, Rfl: 6 .  vitamin E 400 UNIT capsule, Take 400 Units by mouth daily., Disp: , Rfl:  .  metFORMIN (GLUCOPHAGE) 1000 MG tablet, Take 1 tablet (1,000 mg total) by mouth 2 (two) times daily., Disp: 180 tablet, Rfl: 3   Allergies  Allergen Reactions  . Atorvastatin     Myalgias  . Erythromycin     Stomach Problems'   . Levaquin [Levofloxacin In D5w]   . Penicillins     Heart races  . Rosuvastatin     Myalgias  . Simvastatin     Myalgias  . Tequin [Gatifloxacin]   . Zoloft [Sertraline Hcl]   . Zithromax [Azithromycin] Palpitations    Heart race    ROS Review of Systems  Constitutional: Negative.   HENT: Negative.   Eyes: Negative.   Respiratory: Negative.   Cardiovascular: Negative.   Gastrointestinal: Negative.   Endocrine: Negative.   Genitourinary: Negative.   Musculoskeletal: Negative.   Skin: Negative.   Allergic/Immunologic: Negative.   Neurological: Negative.   Hematological: Negative.   Psychiatric/Behavioral: Negative.   All other systems reviewed and are negative.     Objective:    Physical Exam Vitals reviewed.  Constitutional:      Appearance: Normal appearance.  HENT:     Mouth/Throat:     Mouth: Mucous membranes are moist.  Eyes:     Pupils: Pupils are equal, round, and reactive to light.  Neck:     Vascular: No  carotid bruit.  Cardiovascular:     Rate and Rhythm: Normal rate and regular rhythm.     Pulses: Normal pulses.     Heart sounds: Normal heart sounds.  Pulmonary:     Effort: Pulmonary effort is normal.     Breath sounds: Normal breath sounds.  Abdominal:     General: Bowel sounds are normal.     Palpations: Abdomen is soft. There is no hepatomegaly, splenomegaly or mass.     Tenderness: There is no abdominal  tenderness.     Hernia: No hernia is present.  Musculoskeletal:        General: No tenderness.     Cervical back: Neck supple.     Right lower leg: No edema.     Left lower leg: No edema.  Skin:    Findings: No rash.  Neurological:     Mental Status: She is alert and oriented to person, place, and time.     Motor: No weakness.  Psychiatric:        Mood and Affect: Mood and affect normal.        Behavior: Behavior normal.     BP 139/73   Pulse 71   Ht 5\' 4"  (1.626 m)   Wt 147 lb (66.7 kg)   BMI 25.23 kg/m  Wt Readings from Last 3 Encounters:  02/08/21 147 lb (66.7 kg)  12/16/20 150 lb (68 kg)  11/04/20 155 lb (70.3 kg)     Health Maintenance Due  Topic Date Due  . Hepatitis C Screening  Never done  . FOOT EXAM  Never done  . OPHTHALMOLOGY EXAM  Never done  . TETANUS/TDAP  Never done  . COLONOSCOPY (Pts 45-5yrs Insurance coverage will need to be confirmed)  Never done  . MAMMOGRAM  Never done  . DEXA SCAN  Never done  . PNA vac Low Risk Adult (2 of 2 - PCV13) 07/30/2020  . COVID-19 Vaccine (3 - Booster for Pfizer series) 08/14/2020  . HEMOGLOBIN A1C  11/11/2020    There are no preventive care reminders to display for this patient.  Lab Results  Component Value Date   TSH 4.41 07/22/2014   Lab Results  Component Value Date   WBC 5.2 05/12/2020   HGB 12.1 05/12/2020   HCT 36.3 05/12/2020   MCV 89.2 05/12/2020   PLT 174 05/12/2020   Lab Results  Component Value Date   NA 140 05/12/2020   K 5.0 05/12/2020   CO2 19 (L) 05/12/2020   GLUCOSE 149  (H) 05/12/2020   BUN 19 05/12/2020   CREATININE 0.88 05/12/2020   BILITOT 0.5 05/12/2020   ALKPHOS 56 10/29/2019   AST 19 05/12/2020   ALT 19 11/02/2020   PROT 6.7 05/12/2020   ALBUMIN 4.3 10/29/2019   CALCIUM 9.5 05/12/2020   ANIONGAP 11 10/29/2019   Lab Results  Component Value Date   CHOL 168 11/02/2020   Lab Results  Component Value Date   HDL 54 11/02/2020   Lab Results  Component Value Date   LDLCALC UNABLE TO CALCULATE IF TRIGLYCERIDE OVER 400 mg/dL 16/09/9603   Lab Results  Component Value Date   TRIG 412 (H) 11/02/2020   Lab Results  Component Value Date   CHOLHDL 3.1 11/02/2020   Lab Results  Component Value Date   HGBA1C 5.9 (H) 05/12/2020      Assessment & Plan:   Problem List Items Addressed This Visit      Cardiovascular and Mediastinum   Coronary artery disease involving native coronary artery of native heart without angina pectoris    Patient is not having any angina she is following low-cholesterol low triglyceride diet.      Ischemic cardiomyopathy - Primary    Stable at the present time no paroxysmal nocturnal dyspnea or swelling of the legs.      Essential hypertension    Patient blood pressure is normal patient denies any chest pain or shortness of breath there is no history of palpitation paroxysmal nocturnal dyspnea patient  can walk 1 blocks without any problem patient was advised to follow low-salt low-cholesterol diet        Other   Shortness of breath    Resolved because of the loss of weight      Mixed hyperlipidemia    LDL is stable, triglycerides are going down.  The last one was more than 400.         No orders of the defined types were placed in this encounter.   Follow-up: Return in 6 months (on 08/08/2021).    Corky Downs, MD

## 2021-02-08 NOTE — Assessment & Plan Note (Signed)
LDL is stable, triglycerides are going down.  The last one was more than 400.

## 2021-03-04 ENCOUNTER — Telehealth: Payer: Self-pay | Admitting: Internal Medicine

## 2021-03-04 NOTE — Telephone Encounter (Signed)
   [facility] Pre-operative Risk Assessment    HEARTCARE STAFF: - Please ensure there is not already an duplicate clearance open for this procedure. - Under Visit Info/Reason for Call, type in Other and utilize the format Clearance MM/DD/YY or Clearance TBD. Do not use dashes or single digits. - If request is for dental extraction, please clarify the # of teeth to be extracted.  Request for surgical clearance:  1. What type of surgery is being performed? Single surgical dental extraction with bone grafting   2. When is this surgery scheduled? TBD  3. What type of clearance is required (medical clearance vs. Pharmacy clearance to hold med vs. Both)? both  4. Are there any medications that need to be held prior to surgery and how long? Aspirin instructions  5. Practice name and name of physician performing surgery? [facility]   6. What is the office phone number? [phone]   7.   What is the office fax number? [phone]  8.   Anesthesia type (None, local, MAC, general) ? Not listed    Donna Harper 03/04/2021, 3:06 PM  _________________________________________________________________   (provider comments below)

## 2021-03-05 NOTE — Telephone Encounter (Signed)
   Primary Cardiologist: Nelva Bush, MD  Chart reviewed as part of pre-operative protocol coverage. Patient was contacted 03/05/2021 in reference to pre-operative risk assessment for pending surgery as outlined below.  Donna Harper was last seen on 12/16/2020 by Ignacia Bayley, NP.  Since that day, Donna Harper has done well without any chest pain or shortness of breath.  For single tooth extraction we typically do not hold aspirin, however with possible bone grafting, may increase the risk of bleeding.  If needed, patient may hold aspirin for 5 to 7 days prior to the procedure and restart as soon as possible afterward.  Therefore, based on ACC/AHA guidelines, the patient would be at acceptable risk for the planned procedure without further cardiovascular testing.   The patient was advised that if she develops new symptoms prior to surgery to contact our office to arrange for a follow-up visit, and she verbalized understanding.  I will route this recommendation to the requesting party via Epic fax function and remove from pre-op pool. Please call with questions.   [place], [place] 03/05/2021, 1:43 PM

## 2021-03-09 ENCOUNTER — Other Ambulatory Visit: Payer: Self-pay | Admitting: *Deleted

## 2021-03-09 ENCOUNTER — Other Ambulatory Visit: Payer: Self-pay

## 2021-03-09 ENCOUNTER — Encounter: Payer: Self-pay | Admitting: Internal Medicine

## 2021-03-09 ENCOUNTER — Ambulatory Visit: Payer: Medicare HMO | Admitting: Internal Medicine

## 2021-03-09 VITALS — BP 144/71 | HR 69 | Ht 64.0 in | Wt 146.3 lb

## 2021-03-09 DIAGNOSIS — J329 Chronic sinusitis, unspecified: Secondary | ICD-10-CM | POA: Diagnosis not present

## 2021-03-09 DIAGNOSIS — E1169 Type 2 diabetes mellitus with other specified complication: Secondary | ICD-10-CM

## 2021-03-09 DIAGNOSIS — E782 Mixed hyperlipidemia: Secondary | ICD-10-CM

## 2021-03-09 DIAGNOSIS — I10 Essential (primary) hypertension: Secondary | ICD-10-CM

## 2021-03-09 LAB — POC COVID19 BINAXNOW: SARS Coronavirus 2 Ag: NEGATIVE

## 2021-03-09 MED ORDER — DOXYCYCLINE HYCLATE 100 MG PO TABS: 100.0000 mg | ORAL_TABLET | Freq: Two times a day (BID) | ORAL | 0 refills | Status: DC

## 2021-03-09 NOTE — Assessment & Plan Note (Signed)
Hypercholesterolemia  I advised the patient to follow Mediterranean diet This diet is rich in fruits vegetables and whole grain, and This diet is also rich in fish and lean meat Patient should also eat a handful of almonds or walnuts daily Recent heart study indicated that average follow-up on this kind of diet reduces the cardiovascular mortality by 50 to 70%== 

## 2021-03-09 NOTE — Progress Notes (Signed)
Established Patient Office Visit  Subjective:  Patient ID: Donna Harper, female    DOB: 1951-04-03  Age: 70 y.o. MRN: [id number]  CC:  Chief Complaint  Patient presents with  . Sinusitis    Patient having sinus issues with ear pain. Having bloody snot when she blows her nose.     Sinusitis This is a new problem. The current episode started in the past 7 days. The problem has been waxing and waning since onset. There has been no fever. Her pain is at a severity of 1/10. Associated symptoms include congestion, coughing and ear pain. Pertinent negatives include no chills, headaches, hoarse voice, neck pain, shortness of breath, sneezing or sore throat. Past treatments include oral decongestants.    Donna Harper presents for sinus pain  Past Medical History:  Diagnosis Date  . CAD (coronary artery disease)    a. 07/2019 NSTEMI/PCI: LM 15d, LAD 40p, 50/42m, RI nl, LCX nl, OM1 small, RCA large, 30p, 48m (3.0x12 Resolute Onyx DES), RPDA fills via L->R collats (OM1), EF 45-50%; b. 11/2020 MV: EF>65%, no ischemia/infact-->low risk.  [name] GERD (gastroesophageal reflux disease)   . Hyperlipidemia LDL goal <70   . Hypertension   . Ischemic cardiomyopathy    a. 07/30/2019 LV gram: EF 45-50%, basal inf HK; b. 07/31/2019 Echo: EF 55-60%, impaired relaxation, sev basal inf HK. Nl RV fxn.   . Joint pain   . Murmur    a. 07/2019 Echo: no significant valvular abnormalities. Mild AoV thickening.  [name] TIA (transient ischemic attack) 08/2014  . Type II diabetes mellitus (HCC)     Past Surgical History:  Procedure Laterality Date  . BREAST BIOPSY Bilateral 1988   benign/Dr Donna Harper  . CARDIAC CATHETERIZATION    . CESAREAN SECTION  1986  . CORONARY STENT INTERVENTION N/A 07/30/2019   Procedure: CORONARY STENT INTERVENTION;  Surgeon: [name], MD;  Location: [facility];  Service: Cardiovascular;  Laterality: N/A;  RCA  . LEFT HEART CATH AND CORONARY ANGIOGRAPHY N/A 07/30/2019    Procedure: LEFT HEART CATH AND CORONARY ANGIOGRAPHY;  Surgeon: [name], MD;  Location: [facility];  Service: Cardiovascular;  Laterality: N/A;    Family History  Problem Relation Age of Onset  . COPD Mother   . COPD Father     Social History   Socioeconomic History  . Marital status: Married    Spouse name: Not on file  . Number of children: Not on file  . Years of education: Not on file  . Highest education level: Not on file  Occupational History  . Not on file  Tobacco Use  . Smoking status: Former Games developer  . Smokeless tobacco: Never Used  . Tobacco comment: Quit 30 years ago  Vaping Use  . Vaping Use: Never used  Substance and Sexual Activity  . Alcohol use: No    Alcohol/week: 0.0 standard drinks  . Drug use: No  . Sexual activity: Not on file  Other Topics Concern  . Not on file  Social History Narrative  . Not on file   Social Determinants of Health   Financial Resource Strain: Not on file  Food Insecurity: Not on file  Transportation Needs: Not on file  Physical Activity: Not on file  Stress: Not on file  Social Connections: Not on file  Intimate Partner Violence: Not on file     Current Outpatient Medications:  .  Alirocumab (PRALUENT) 150 MG/ML SOAJ, Inject 150 mg into the skin  every 14 (fourteen) days., Disp: 14 mL, Rfl: 2 .  aspirin 81 MG tablet, Take 81 mg by mouth daily., Disp: , Rfl:  .  cholecalciferol (VITAMIN D) 1000 UNITS tablet, Take 1,000 Units by mouth daily., Disp: , Rfl:  .  diphenhydrAMINE (BENADRYL) 25 MG tablet, Take 25 mg by mouth at bedtime., Disp: , Rfl:  .  doxycycline (VIBRA-TABS) 100 MG tablet, Take 1 tablet (100 mg total) by mouth 2 (two) times daily., Disp: 20 tablet, Rfl: 0 .  glipiZIDE (GLUCOTROL XL) 5 MG 24 hr tablet, Take 1 tablet (5 mg total) by mouth daily., Disp: 90 tablet, Rfl: 3 .  icosapent Ethyl (VASCEPA) 1 g capsule, Take 2 capsules (2 g total) by mouth 2 (two) times daily. Please bill to [employer] on file as secondary insurance., Disp: 120 capsule, Rfl: 6 .  losartan (COZAAR) 100 MG tablet, TAKE 1 TABLET BY MOUTH DAILY, Disp: 90 tablet, Rfl: 1 .  metFORMIN (GLUCOPHAGE) 1000 MG tablet, Take 1 tablet (1,000 mg total) by mouth 2 (two) times daily., Disp: 180 tablet, Rfl: 3 .  metoprolol tartrate (LOPRESSOR) 50 MG tablet, Take 1 tablet (50 mg total) by mouth 2 (two) times daily., Disp: 60 tablet, Rfl: 5 .  Multiple Vitamin (MULTIVITAMIN WITH MINERALS) TABS tablet, Take 1 tablet by mouth daily., Disp: , Rfl:  .  NASONEX 50 MCG/ACT nasal spray, Place 2 sprays into the nose daily. , Disp: , Rfl: 0 .  nitroGLYCERIN (NITROSTAT) 0.4 MG SL tablet, Place 1 tablet (0.4 mg total) under the tongue every 5 (five) minutes as needed for chest pain., Disp: 90 tablet, Rfl: 3 .  sitaGLIPtin (JANUVIA) 50 MG tablet, Take 1 tablet (50 mg total) by mouth daily., Disp: 30 tablet, Rfl: 6 .  vitamin E 400 UNIT capsule, Take 400 Units by mouth daily., Disp: , Rfl:    Allergies  Allergen Reactions  . Atorvastatin     Myalgias  . Erythromycin     Stomach Problems'   . Levaquin [Levofloxacin In D5w]   . Penicillins     Heart races  . Rosuvastatin     Myalgias  . Simvastatin     Myalgias  . Tequin [Gatifloxacin]   . Zoloft [Sertraline Hcl]   . Zithromax [Azithromycin] Palpitations    Heart race    ROS Review of Systems  Constitutional: Negative.  Negative for chills.  HENT: Positive for congestion and ear pain. Negative for hoarse voice, sneezing and sore throat.   Eyes: Negative.   Respiratory: Positive for cough. Negative for shortness of breath.   Cardiovascular: Negative.   Gastrointestinal: Negative.   Endocrine: Negative.   Genitourinary: Negative.   Musculoskeletal: Negative.  Negative for neck pain.  Skin: Negative.   Allergic/Immunologic: Negative.   Neurological: Negative.  Negative for headaches.  Hematological: Negative.   Psychiatric/Behavioral: Negative.   All other systems  reviewed and are negative.     Objective:    Physical Exam Vitals reviewed.  Constitutional:      Appearance: Normal appearance.  HENT:     Mouth/Throat:     Mouth: Mucous membranes are moist.  Eyes:     Pupils: Pupils are equal, round, and reactive to light.  Neck:     Vascular: No carotid bruit.  Cardiovascular:     Rate and Rhythm: Normal rate and regular rhythm.     Pulses: Normal pulses.     Heart sounds: Normal heart sounds.  Pulmonary:     Effort: Pulmonary effort  is normal.     Breath sounds: Normal breath sounds.  Abdominal:     General: Bowel sounds are normal.     Palpations: Abdomen is soft. There is no hepatomegaly, splenomegaly or mass.     Tenderness: There is no abdominal tenderness.     Hernia: No hernia is present.  Musculoskeletal:        General: No tenderness.     Cervical back: Neck supple.     Right lower leg: No edema.     Left lower leg: No edema.  Skin:    Findings: No rash.  Neurological:     Mental Status: She is alert and oriented to person, place, and time.     Motor: No weakness.  Psychiatric:        Mood and Affect: Mood and affect normal.        Behavior: Behavior normal.     BP (!) 144/71   Pulse 69   Ht 5\' 4"  (1.626 m)   Wt 146 lb 4.8 oz (66.4 kg)   BMI 25.11 kg/m  Wt Readings from Last 3 Encounters:  03/09/21 146 lb 4.8 oz (66.4 kg)  02/08/21 147 lb (66.7 kg)  12/16/20 150 lb (68 kg)     Health Maintenance Due  Topic Date Due  . Hepatitis C Screening  Never done  . FOOT EXAM  Never done  . OPHTHALMOLOGY EXAM  Never done  . TETANUS/TDAP  Never done  . COLONOSCOPY (Pts 45-7yrs Insurance coverage will need to be confirmed)  Never done  . MAMMOGRAM  Never done  . DEXA SCAN  Never done  . PNA vac Low Risk Adult (2 of 2 - PCV13) 07/30/2020  . COVID-19 Vaccine (3 - Booster for Pfizer series) 08/14/2020  . HEMOGLOBIN A1C  11/11/2020    There are no preventive care reminders to display for this patient.  Lab Results   Component Value Date   TSH 4.41 07/22/2014   Lab Results  Component Value Date   WBC 5.2 05/12/2020   HGB 12.1 05/12/2020   HCT 36.3 05/12/2020   MCV 89.2 05/12/2020   PLT 174 05/12/2020   Lab Results  Component Value Date   NA 140 05/12/2020   K 5.0 05/12/2020   CO2 19 (L) 05/12/2020   GLUCOSE 149 (H) 05/12/2020   BUN 19 05/12/2020   CREATININE 0.88 05/12/2020   BILITOT 0.5 05/12/2020   ALKPHOS 56 10/29/2019   AST 19 05/12/2020   ALT 19 11/02/2020   PROT 6.7 05/12/2020   ALBUMIN 4.3 10/29/2019   CALCIUM 9.5 05/12/2020   ANIONGAP 11 10/29/2019   Lab Results  Component Value Date   CHOL 168 11/02/2020   Lab Results  Component Value Date   HDL 54 11/02/2020   Lab Results  Component Value Date   LDLCALC UNABLE TO CALCULATE IF TRIGLYCERIDE OVER 400 mg/dL 56/21/3086   Lab Results  Component Value Date   TRIG 412 (H) 11/02/2020   Lab Results  Component Value Date   CHOLHDL 3.1 11/02/2020   Lab Results  Component Value Date   HGBA1C 5.9 (H) 05/12/2020      Assessment & Plan:   Problem List Items Addressed This Visit      Cardiovascular and Mediastinum   Essential hypertension    Patient blood pressure is normal patient denies any chest pain or shortness of breath there is no history of palpitation paroxysmal nocturnal dyspnea patient can walkioo yards without any problem patient was advised  to follow low-salt low-cholesterol diet  I reviewed the results of Sprint trial  ideally I want to keep systolic blood pressure below 347 mmHg, patient was asked to check blood pressure 3 times a week and give me a report on that.  Patient will be follow-up in 3 months, patient will call me back for any change in the cardiovascular symptoms           Respiratory   Sinusitis - Primary    Patient has bilateral sinus pain.  We will start her on doxycycline.  She was also advised to take Claritin 10 mg p.o. daily.  If she is not better she should come and reported to me  in a week      Relevant Medications   doxycycline (VIBRA-TABS) 100 MG tablet   Other Relevant Orders   POC COVID-19 (Completed)     Endocrine   Diabetes mellitus (HCC)    - The patient's blood sugar is under control on med. - The patient will continue the current treatment regimen.  - I encouraged the patient to regularly check blood sugar.  - I encouraged the patient to monitor diet. I encouraged the patient to eat low-carb and low-sugar to help prevent blood sugar spikes.  - I encouraged the patient to continue following their prescribed treatment plan for diabetes - I informed the patient to get help if blood sugar drops below 54mg /dL, or if suddenly have trouble thinking clearly or breathing.         Other   Mixed hyperlipidemia    Hypercholesterolemia  I advised the patient to follow Mediterranean diet This diet is rich in fruits vegetables and whole grain, and This diet is also rich in fish and lean meat Patient should also eat a handful of almonds or walnuts daily Recent heart study indicated that average follow-up on this kind of diet reduces the cardiovascular mortality by 50 to 70%==         Meds ordered this encounter  Medications  . doxycycline (VIBRA-TABS) 100 MG tablet    Sig: Take 1 tablet (100 mg total) by mouth 2 (two) times daily.    Dispense:  20 tablet    Refill:  0    Follow-up: No follow-ups on file.    Corky Downs, MD

## 2021-03-09 NOTE — Assessment & Plan Note (Signed)
Patient has bilateral sinus pain.  We will start her on doxycycline.  She was also advised to take Claritin 10 mg p.o. daily.  If she is not better she should come and reported to me in a week

## 2021-03-09 NOTE — Assessment & Plan Note (Signed)

## 2021-03-09 NOTE — Assessment & Plan Note (Signed)

## 2021-04-15 LAB — HM DIABETES EYE EXAM

## 2021-05-02 ENCOUNTER — Other Ambulatory Visit: Payer: Self-pay | Admitting: Internal Medicine

## 2021-05-03 ENCOUNTER — Encounter: Payer: Self-pay | Admitting: Cardiology

## 2021-05-11 ENCOUNTER — Telehealth: Payer: Self-pay | Admitting: Internal Medicine

## 2021-05-11 DIAGNOSIS — E781 Pure hyperglyceridemia: Secondary | ICD-10-CM

## 2021-05-11 DIAGNOSIS — Z79899 Other long term (current) drug therapy: Secondary | ICD-10-CM

## 2021-05-11 DIAGNOSIS — E785 Hyperlipidemia, unspecified: Secondary | ICD-10-CM

## 2021-05-11 NOTE — Telephone Encounter (Signed)
Patient calling  Patient would like to know if she can have her triglyceride rechecked since she has been on a new medication  Please call to discus

## 2021-05-11 NOTE — Telephone Encounter (Signed)
I called and spoke with the patient. I inquired if she started the Vascepa medication that was recommended to her in January. The patient confirms she has not started Vascepa, but has made "huge" dietary changes and wanted to have her lab work rechecked.  I have advised her I will place orders for her to have this done at the [facility]. I have asked her to fast for 8 hours prior to her lab draw. She has an appointment with Dr. Saunders Revel on 06/02/21. She is aware she can have this done anytime between now and the day prior to her appointment.  The patient voices understanding and is agreeable.   I will remove Vascepa from her medication list as she has not started this.

## 2021-05-17 ENCOUNTER — Other Ambulatory Visit
Admission: RE | Admit: 2021-05-17 | Discharge: 2021-05-17 | Disposition: A | Discharge status: Home / Self Care | Payer: Medicare HMO | Attending: Internal Medicine | Admitting: Internal Medicine

## 2021-05-17 DIAGNOSIS — E781 Pure hyperglyceridemia: Secondary | ICD-10-CM | POA: Insufficient documentation

## 2021-05-17 DIAGNOSIS — I2510 Atherosclerotic heart disease of native coronary artery without angina pectoris: Secondary | ICD-10-CM

## 2021-05-17 DIAGNOSIS — I10 Essential (primary) hypertension: Secondary | ICD-10-CM

## 2021-05-17 DIAGNOSIS — Z79899 Other long term (current) drug therapy: Secondary | ICD-10-CM | POA: Insufficient documentation

## 2021-05-17 DIAGNOSIS — E785 Hyperlipidemia, unspecified: Secondary | ICD-10-CM | POA: Diagnosis not present

## 2021-05-17 LAB — LIPID PANEL
Cholesterol: 138 mg/dL (ref 0–200)
HDL: 49 mg/dL (ref >40)
LDL Cholesterol: 26 mg/dL (ref 0–99)
Total CHOL/HDL Ratio: 2.8 RATIO
Triglycerides: 313 mg/dL — ABNORMAL HIGH (ref <150)
VLDL: 63 mg/dL — ABNORMAL HIGH (ref 0–40)

## 2021-05-17 LAB — HEPATIC FUNCTION PANEL
ALT: 14 U/L (ref 0–44)
AST: 19 U/L (ref 15–41)
Albumin: 4.4 g/dL (ref 3.5–5.0)
Alkaline Phosphatase: 45 U/L (ref 38–126)
Bilirubin, Direct: <0.1 mg/dL (ref 0.0–0.2)
Total Bilirubin: 0.7 mg/dL (ref 0.3–1.2)
Total Protein: 6.9 g/dL (ref 6.5–8.1)

## 2021-06-02 ENCOUNTER — Encounter: Payer: Self-pay | Admitting: Internal Medicine

## 2021-06-02 ENCOUNTER — Ambulatory Visit: Payer: Medicare HMO | Admitting: Internal Medicine

## 2021-06-02 ENCOUNTER — Other Ambulatory Visit: Payer: Self-pay

## 2021-06-02 VITALS — BP 110/70 | HR 68 | Ht 63.0 in | Wt 142.0 lb

## 2021-06-02 DIAGNOSIS — I2510 Atherosclerotic heart disease of native coronary artery without angina pectoris: Secondary | ICD-10-CM | POA: Diagnosis not present

## 2021-06-02 DIAGNOSIS — E1169 Type 2 diabetes mellitus with other specified complication: Secondary | ICD-10-CM | POA: Diagnosis not present

## 2021-06-02 DIAGNOSIS — Z79899 Other long term (current) drug therapy: Secondary | ICD-10-CM | POA: Diagnosis not present

## 2021-06-02 DIAGNOSIS — E785 Hyperlipidemia, unspecified: Secondary | ICD-10-CM

## 2021-06-02 DIAGNOSIS — I10 Essential (primary) hypertension: Secondary | ICD-10-CM

## 2021-06-02 DIAGNOSIS — G720 Drug-induced myopathy: Secondary | ICD-10-CM

## 2021-06-02 MED ORDER — ICOSAPENT ETHYL 1 G PO CAPS: 2.0000 g | ORAL_CAPSULE | Freq: Two times a day (BID) | ORAL | Status: DC

## 2021-06-02 NOTE — Progress Notes (Signed)
Follow-up Outpatient Visit Date: 06/02/2021  Primary Care Provider: Cletis Athens, MD [address] [place] [address]  Chief Complaint: Follow-up coronary artery disease and hyperlipidemia  HPI:  Donna Harper is a 70 y.o. female with history of CAD status post PCI to the RCA in setting of NSTEMI (07/2019), HTN, HLD, DM2, TIA, and GERD, who presents for follow-up of the artery disease and hyperlipidemia.  I last saw her in 10/2020, which time Ms. Lahmann was doing well other than a single episode of left-sided jaw and arm pain reminiscent of what she experienced at the time of her MI in 2020.  Subsequent myocardial perfusion stress test was low risk without evidence of ischemia or scar.  She was seen for follow-up by Ignacia Bayley, NP, in early January, at which time she was feeling well without further symptoms.  She had been prescribed Vascepa for management of her hypertriglyceridemia but had yet to start this medication out of concerns for GI intolerance, as she previously experienced diarrhea while taking OTC fish oil.  She was encouraged to continue reducing her simple carbohydrates.  LDL was well controlled on Praluent with a history of significant myalgias with statin therapy.  Today, Ms. Skeen reports that she is feeling quite well.  She denies chest pain, shortness of breath, palpitations, lightheadedness, and edema.  She has been trying to control her triglycerides by significantly reducing her carbohydrate intake.  She is tolerating Praluent well, though she notes mild transient nausea after taking the injection.  --------------------------------------------------------------------------------------------------  Past Medical History:  Diagnosis Date   CAD (coronary artery disease)    a. 07/2019 NSTEMI/PCI: LM 15d, LAD 40p, 50/18m, RI nl, LCX nl, OM1 small, RCA large, 30p, 65m (3.0x12 Resolute Onyx DES), RPDA fills via L->R collats (OM1), EF 45-50%; b. 11/2020 MV: EF>65%, no  ischemia/infact-->low risk.   GERD (gastroesophageal reflux disease)    Hyperlipidemia LDL goal <70    Hypertension    Ischemic cardiomyopathy    a. 07/30/2019 LV gram: EF 45-50%, basal inf HK; b. 07/31/2019 Echo: EF 55-60%, impaired relaxation, sev basal inf HK. Nl RV fxn.    Joint pain    Murmur    a. 07/2019 Echo: no significant valvular abnormalities. Mild AoV thickening.   TIA (transient ischemic attack) 08/2014   Type II diabetes mellitus ([place])    Past Surgical History:  Procedure Laterality Date   BREAST BIOPSY Bilateral 1988   benign/Dr Elburn   CORONARY STENT INTERVENTION N/A 07/30/2019   Procedure: CORONARY STENT INTERVENTION;  Surgeon: Nelva Bush, MD;  Location: [facility];  Service: Cardiovascular;  Laterality: N/A;  RCA   LEFT HEART CATH AND CORONARY ANGIOGRAPHY N/A 07/30/2019   Procedure: LEFT HEART CATH AND CORONARY ANGIOGRAPHY;  Surgeon: Nelva Bush, MD;  Location: [facility];  Service: Cardiovascular;  Laterality: N/A;    Current Meds  Medication Sig   Alirocumab (PRALUENT) 150 MG/ML SOAJ Inject 150 mg into the skin every 14 (fourteen) days.   aspirin 81 MG tablet Take 81 mg by mouth daily.   cholecalciferol (VITAMIN D) 1000 UNITS tablet Take 1,000 Units by mouth daily.   diphenhydrAMINE (BENADRYL) 25 MG tablet Take 25 mg by mouth at bedtime.   doxycycline (VIBRA-TABS) 100 MG tablet Take 1 tablet (100 mg total) by mouth 2 (two) times daily.   glipiZIDE (GLUCOTROL XL) 5 MG 24 hr tablet Take 1 tablet (5 mg total)  by mouth daily.   icosapent Ethyl (VASCEPA) 1 g capsule Take 2 capsules (2 g total) by mouth 2 (two) times daily.   losartan (COZAAR) 100 MG tablet TAKE 1 TABLET BY MOUTH DAILY   metFORMIN (GLUCOPHAGE) 1000 MG tablet Take 1 tablet (1,000 mg total) by mouth 2 (two) times daily.   metoprolol tartrate (LOPRESSOR) 50 MG tablet TAKE 1 TABLET(50 MG) BY MOUTH TWICE DAILY   Multiple  Vitamin (MULTIVITAMIN WITH MINERALS) TABS tablet Take 1 tablet by mouth daily.   NASONEX 50 MCG/ACT nasal spray Place 2 sprays into the nose daily.    nitroGLYCERIN (NITROSTAT) 0.4 MG SL tablet Place 1 tablet (0.4 mg total) under the tongue every 5 (five) minutes as needed for chest pain.   sitaGLIPtin (JANUVIA) 50 MG tablet Take 1 tablet (50 mg total) by mouth daily.   vitamin E 400 UNIT capsule Take 400 Units by mouth daily.    Allergies: Atorvastatin, Erythromycin, Levaquin [levofloxacin in d5w], Penicillins, Rosuvastatin, Simvastatin, Tequin [gatifloxacin], Zoloft [sertraline hcl], and Zithromax [azithromycin]  Social History   Tobacco Use   Smoking status: Former    Pack years: 0.00   Smokeless tobacco: Never   Tobacco comments:    Quit 30 years ago  Electronics engineer Use   Vaping Use: Never used  Substance Use Topics   Alcohol use: No    Alcohol/week: 0.0 standard drinks   Drug use: No    Family History  Problem Relation Age of Onset   COPD Mother    COPD Father     Review of Systems: A 12-system review of systems was performed and was negative except as noted in the HPI.  --------------------------------------------------------------------------------------------------  Physical Exam: BP 110/70 (BP Location: Left Arm, Patient Position: Sitting, Cuff Size: Normal)   Pulse 68   Ht 5\' 3"  (1.6 m)   Wt 142 lb (64.4 kg)   SpO2 96%   BMI 25.15 kg/m   General:  NAD. Neck: No JVD or HJR. Lungs: Clear to auscultation bilaterally without wheezes or crackles. Heart: Regular rate and rhythm without murmurs, rubs, or gallops. Abdomen: Soft, nontender, nondistended. Extremities: No lower extremity edema.  EKG: Normal sinus rhythm with low voltage.  Otherwise, no significant abnormality or change from prior tracing on 11/04/2020.  Lab Results  Component Value Date   WBC 5.2 05/12/2020   HGB 12.1 05/12/2020   HCT 36.3 05/12/2020   MCV 89.2 05/12/2020   PLT 174 05/12/2020     Lab Results  Component Value Date   NA 140 05/12/2020   K 5.0 05/12/2020   CL 108 05/12/2020   CO2 19 (L) 05/12/2020   BUN 19 05/12/2020   CREATININE 0.88 05/12/2020   GLUCOSE 149 (H) 05/12/2020   ALT 14 05/17/2021    Lab Results  Component Value Date   CHOL 138 05/17/2021   HDL 49 05/17/2021   LDLCALC 26 05/17/2021   LDLDIRECT 52.4 11/02/2020   TRIG 313 (H) 05/17/2021   CHOLHDL 2.8 05/17/2021    --------------------------------------------------------------------------------------------------  ASSESSMENT AND PLAN: Coronary artery disease: Ms. Weber is doing well without recurrent chest pain.  I have encouraged her to remain active and continue her current medications for secondary prevention.  Hyperlipidemia associated with type 2 diabetes mellitus and drug-induced myopathy: Ms. Abbs is tolerating Praluent well with only mild transient nausea after receiving the injection.  She has not had any myalgias, which precluded use of multiple statins in the past.  Given her persistent hypertriglyceridemia, I have encouraged her to try  taking Vascepa.  We will begin 1 g daily for a week.  If she tolerates this, she should increase to 2 g twice daily with follow-up lipid panel in 3 months.  If she is intolerant of Vascepa due to diarrhea, we could try a fibrate instead.  Hypertension: Blood pressure well controlled today.  Continue metoprolol and losartan.  Follow-up: Return to clinic in 6 months.  Nelva Bush, MD 06/03/2021 7:31 AM

## 2021-06-02 NOTE — Patient Instructions (Addendum)
Medication Instructions:   Your physician has recommended you make the following change in your medication:   START Vascepa - May take 1 gram capsule daily for 1 week to make sure able to tolerate                             - If tolerate, may increase to 2 grams TWICE daily  Please call the office if you are unable to tolerate Vascepa.   *If you need a refill on your cardiac medications before your next appointment, please call your pharmacy*   Lab Work:  If you tolerate and are able to continue Vascepa 2 gram TWICE daily:   -  Please have FASTING lab work for Lipid Panel in 3 months at the [place] at [facility]  -  Please go to the [facility]. You will check in at the front desk to the right as you walk into the atrium. Valet Parking is offered if needed. - No appointment needed. You may go any day between 7 am and 6 pm.   Testing/Procedures:  None ordered   Follow-Up: At [facility], you and your health needs are our priority.  As part of our continuing mission to provide you with exceptional heart care, we have created designated Provider Care Teams.  These Care Teams include your primary Cardiologist (physician) and Advanced Practice Providers (APPs -  Physician Assistants and Nurse Practitioners) who all work together to provide you with the care you need, when you need it.  We recommend signing up for the patient portal called "MyChart".  Sign up information is provided on this After Visit Summary.  MyChart is used to connect with patients for Virtual Visits (Telemedicine).  Patients are able to view lab/test results, encounter notes, upcoming appointments, etc.  Non-urgent messages can be sent to your provider as well.   To learn more about what you can do with MyChart, go to [email or web address].    Your next appointment:   6 month(s)  The format for your next appointment:   In Person  Provider:   You may see Nelva Bush, MD or one of the following  Advanced Practice Providers on your designated Care Team:   Murray Hodgkins, NP Christell Faith, PA-C Marrianne Mood, PA-C Cadence Willow, [place] Laurann [place], NP

## 2021-06-03 ENCOUNTER — Ambulatory Visit: Payer: Medicare HMO | Admitting: Internal Medicine

## 2021-06-03 ENCOUNTER — Encounter: Payer: Self-pay | Admitting: Internal Medicine

## 2021-06-15 ENCOUNTER — Telehealth: Payer: Self-pay | Admitting: Internal Medicine

## 2021-06-15 DIAGNOSIS — E785 Hyperlipidemia, unspecified: Secondary | ICD-10-CM

## 2021-06-15 DIAGNOSIS — E1169 Type 2 diabetes mellitus with other specified complication: Secondary | ICD-10-CM

## 2021-06-15 NOTE — Telephone Encounter (Signed)
Pt c/o medication issue:  1. Name of Medication: Vascepa  2. How are you currently taking this medication (dosage and times per day)? 1 capsule in am and 1 in evening  3. Are you having a reaction (difficulty breathing--STAT)?   4. What is your medication issue? Joint pain to where patient could not turn over in bed. Patient states this is not something she can deal with and would not like to continue  Please advise for other options

## 2021-06-16 NOTE — Telephone Encounter (Signed)
I think low dose fenofibrate is reasonable - since she cannot tolerate Vascepa - it will make the numbers look better, however, the is not good CV risk reduction data with fibrates.  Dr. Debara Pickett

## 2021-06-16 NOTE — Telephone Encounter (Signed)
LDL at goal, trigs remain elevated.  Recommend starting fenofibrate. Since patient has frequent intolerances, recommend starting at low dose 54mg  daily and titrating up

## 2021-06-16 NOTE — Telephone Encounter (Signed)
Given intolerable side effects associated with Vascepa, I think it is reasonable to discontinue this medication.  I will forward Ms. Michalik's concerns to Dr. Debara Pickett and the lipid clinic as well for their thoughts regarding ongoing management of her mixed hyperlipidemia.  Nelva Bush, MD [facility]

## 2021-06-16 NOTE — Telephone Encounter (Signed)
Patient hasn't had an A1C done in over a year. I think her blood sugar control should also be evaluated. Patient should be encouraged to decrease her sugar/carb intake and A1C should be checked to see if she needs adjustment in medications along with her diet.

## 2021-06-17 MED ORDER — FENOFIBRATE 54 MG PO TABS: 54.0000 mg | ORAL_TABLET | Freq: Every day | ORAL | 3 refills | Status: DC

## 2021-06-17 NOTE — Telephone Encounter (Signed)
Based on recommendations from Dr. Debara Pickett and our pharmacy team, let's have Ms. Wignall remain off Vescepa and start fenofibrate 54 mg daily.  We should check a fasting lipid panel, ALT, and hemoglobin A1c in about 6 weeks to assess response.  Nelva Bush, MD [facility]

## 2021-06-17 NOTE — Telephone Encounter (Signed)
Spoke with pt and reviewed recommendations.  Pt agreeable to plan.  

## 2021-06-17 NOTE — Addendum Note (Signed)
Addended by: Loren Racer on: 06/17/2021 10:55 AM   Modules accepted: Orders

## 2021-07-05 ENCOUNTER — Telehealth: Payer: Self-pay | Admitting: Internal Medicine

## 2021-07-05 NOTE — Telephone Encounter (Signed)
Patient calling  States she received a message she needs to reapply for her Praluent medication  Gave code [phone]

## 2021-07-08 ENCOUNTER — Other Ambulatory Visit: Payer: Self-pay

## 2021-07-08 MED ORDER — PRALUENT 150 MG/ML ~~LOC~~ SOAJ
150.0000 mg | SUBCUTANEOUS | 2 refills | Status: DC
Start: 2021-07-08 — End: 2021-08-05

## 2021-07-08 NOTE — Telephone Encounter (Signed)
Alirocumab (PRALUENT) 150 MG/ML SOAJ 14 mL 2 07/08/2021    Sig - Route: Inject 150 mg into the skin every 14 (fourteen) days. - Subcutaneous   Sent to pharmacy as: Alirocumab (PRALUENT) 150 MG/ML Solution Auto-injector   E-Prescribing Status: Receipt confirmed by pharmacy (07/08/2021  8:58 AM EDT)     Pharmacy  [id number] - [place], [place] [place]

## 2021-07-19 ENCOUNTER — Telehealth: Payer: Self-pay | Admitting: Internal Medicine

## 2021-07-19 NOTE — Telephone Encounter (Signed)
Patient wants to know what pain medication she can take with her other medications

## 2021-07-20 ENCOUNTER — Ambulatory Visit
Admission: RE | Admit: 2021-07-20 | Discharge: 2021-07-20 | Disposition: A | Discharge status: Home / Self Care | Payer: Medicare HMO | Source: Ambulatory Visit | Attending: Internal Medicine | Admitting: Internal Medicine

## 2021-07-20 ENCOUNTER — Other Ambulatory Visit: Payer: Self-pay

## 2021-07-20 ENCOUNTER — Ambulatory Visit
Admission: RE | Admit: 2021-07-20 | Discharge: 2021-07-20 | Disposition: A | Discharge status: Home / Self Care | Payer: Medicare HMO | Attending: Internal Medicine | Admitting: Internal Medicine

## 2021-07-20 ENCOUNTER — Other Ambulatory Visit: Payer: Self-pay | Admitting: *Deleted

## 2021-07-20 DIAGNOSIS — R0781 Pleurodynia: Secondary | ICD-10-CM | POA: Insufficient documentation

## 2021-07-20 DIAGNOSIS — R079 Chest pain, unspecified: Secondary | ICD-10-CM | POA: Diagnosis not present

## 2021-07-20 NOTE — Telephone Encounter (Signed)
Pt fell and broke "a couple ribs" over the weekend.  She would like Dr. Saunders Revel to advise if she may take Extra strength Tylenol or Advil / Aleve for pain along with her other medications. Pt states she was not interested in taking narcotic pain medication and wants to only take otc if she can.

## 2021-07-20 NOTE — Telephone Encounter (Signed)
Spoke with pt and made aware of recc below.  Pt voiced understanding. No further needs at this time.

## 2021-07-20 NOTE — Telephone Encounter (Signed)
Ok to take tylenol. Can try alternating tylenol with Advil in the short term. Should use advil sparingly but ok to use for a few days

## 2021-07-21 ENCOUNTER — Ambulatory Visit (INDEPENDENT_AMBULATORY_CARE_PROVIDER_SITE_OTHER): Payer: Medicare HMO | Admitting: Internal Medicine

## 2021-07-21 ENCOUNTER — Encounter: Payer: Self-pay | Admitting: Internal Medicine

## 2021-07-21 VITALS — BP 141/70 | HR 74 | Ht 63.0 in | Wt 144.7 lb

## 2021-07-21 DIAGNOSIS — E1169 Type 2 diabetes mellitus with other specified complication: Secondary | ICD-10-CM

## 2021-07-21 DIAGNOSIS — I10 Essential (primary) hypertension: Secondary | ICD-10-CM | POA: Diagnosis not present

## 2021-07-21 DIAGNOSIS — I214 Non-ST elevation (NSTEMI) myocardial infarction: Secondary | ICD-10-CM

## 2021-07-21 DIAGNOSIS — K8020 Calculus of gallbladder without cholecystitis without obstruction: Secondary | ICD-10-CM

## 2021-07-21 NOTE — Assessment & Plan Note (Signed)
Diabetes under control °

## 2021-07-21 NOTE — Assessment & Plan Note (Signed)

## 2021-07-21 NOTE — Assessment & Plan Note (Signed)
Patient denies any chest pain or shortness of

## 2021-07-21 NOTE — Progress Notes (Signed)
Established Patient Office Visit  Subjective:  Patient ID: Donna Harper, female    DOB: September 14, 1951  Age: 70 y.o. MRN: [id number]  CC:  Chief Complaint  Patient presents with   Rib Injury    Patient had recent fall and now having rib pain. Patient had chest xray yesterday that was neg for any fractures     HPI  Donna Harper presents for checkup, chest x-ray did not show any new fracture she has some bruises on the left side of the chest and under the left breast.  It is very tender on certain points.  She is not tender in the splenic or liver area.  Bowel sounds are audible.  Past Medical History:  Diagnosis Date   CAD (coronary artery disease)    a. 07/2019 NSTEMI/PCI: LM 15d, LAD 40p, 50/33m, RI nl, LCX nl, OM1 small, RCA large, 30p, 33m (3.0x12 Resolute Onyx DES), RPDA fills via L->R collats (OM1), EF 45-50%; b. 11/2020 MV: EF>65%, no ischemia/infact-->low risk.   GERD (gastroesophageal reflux disease)    Hyperlipidemia LDL goal <70    Hypertension    Ischemic cardiomyopathy    a. 07/30/2019 LV gram: EF 45-50%, basal inf HK; b. 07/31/2019 Echo: EF 55-60%, impaired relaxation, sev basal inf HK. Nl RV fxn.    Joint pain    Murmur    a. 07/2019 Echo: no significant valvular abnormalities. Mild AoV thickening.   TIA (transient ischemic attack) 08/2014   Type II diabetes mellitus ([facility])     Past Surgical History:  Procedure Laterality Date   BREAST BIOPSY Bilateral 1988   benign/Dr Sankar   CARDIAC CATHETERIZATION     CESAREAN SECTION  1986   CORONARY STENT INTERVENTION N/A 07/30/2019   Procedure: CORONARY STENT INTERVENTION;  Surgeon: [name], MD;  Location: [facility];  Service: Cardiovascular;  Laterality: N/A;  RCA   LEFT HEART CATH AND CORONARY ANGIOGRAPHY N/A 07/30/2019   Procedure: LEFT HEART CATH AND CORONARY ANGIOGRAPHY;  Surgeon: [name], MD;  Location: [facility];  Service: Cardiovascular;  Laterality: N/A;    Family History   Problem Relation Age of Onset   COPD Mother    COPD Father     Social History   Socioeconomic History   Marital status: Married    Spouse name: Not on file   Number of children: Not on file   Years of education: Not on file   Highest education level: Not on file  Occupational History   Not on file  Tobacco Use   Smoking status: Former   Smokeless tobacco: Never   Tobacco comments:    Quit 30 years ago  Vaping Use   Vaping Use: Never used  Substance and Sexual Activity   Alcohol use: No    Alcohol/week: 0.0 standard drinks   Drug use: No   Sexual activity: Not on file  Other Topics Concern   Not on file  Social History Narrative   Not on file   Social Determinants of Health   Financial Resource Strain: Not on file  Food Insecurity: Not on file  Transportation Needs: Not on file  Physical Activity: Not on file  Stress: Not on file  Social Connections: Not on file  Intimate Partner Violence: Not on file     Current Outpatient Medications:    Alirocumab (PRALUENT) 150 MG/ML SOAJ, Inject 150 mg into the skin every 14 (fourteen) days., Disp: 14 mL, Rfl: 2   aspirin 81 MG  tablet, Take 81 mg by mouth daily., Disp: , Rfl:    cholecalciferol (VITAMIN D) 1000 UNITS tablet, Take 1,000 Units by mouth daily., Disp: , Rfl:    diphenhydrAMINE (BENADRYL) 25 MG tablet, Take 25 mg by mouth at bedtime., Disp: , Rfl:    doxycycline (VIBRA-TABS) 100 MG tablet, Take 1 tablet (100 mg total) by mouth 2 (two) times daily., Disp: 20 tablet, Rfl: 0   fenofibrate 54 MG tablet, Take 1 tablet (54 mg total) by mouth daily., Disp: 90 tablet, Rfl: 3   glipiZIDE (GLUCOTROL XL) 5 MG 24 hr tablet, Take 1 tablet (5 mg total) by mouth daily., Disp: 90 tablet, Rfl: 3   losartan (COZAAR) 100 MG tablet, TAKE 1 TABLET BY MOUTH DAILY, Disp: 90 tablet, Rfl: 1   metFORMIN (GLUCOPHAGE) 1000 MG tablet, Take 1 tablet (1,000 mg total) by mouth 2 (two) times daily., Disp: 180 tablet, Rfl: 3   metoprolol  tartrate (LOPRESSOR) 50 MG tablet, TAKE 1 TABLET(50 MG) BY MOUTH TWICE DAILY, Disp: 60 tablet, Rfl: 2   Multiple Vitamin (MULTIVITAMIN WITH MINERALS) TABS tablet, Take 1 tablet by mouth daily., Disp: , Rfl:    NASONEX 50 MCG/ACT nasal spray, Place 2 sprays into the nose daily. , Disp: , Rfl: 0   nitroGLYCERIN (NITROSTAT) 0.4 MG SL tablet, Place 1 tablet (0.4 mg total) under the tongue every 5 (five) minutes as needed for chest pain., Disp: 90 tablet, Rfl: 3   sitaGLIPtin (JANUVIA) 50 MG tablet, Take 1 tablet (50 mg total) by mouth daily., Disp: 30 tablet, Rfl: 6   vitamin E 400 UNIT capsule, Take 400 Units by mouth daily., Disp: , Rfl:    Allergies  Allergen Reactions   Atorvastatin     Myalgias   Erythromycin     Stomach Problems'    Levaquin [Levofloxacin In D5w]    Penicillins     Heart races   Rosuvastatin     Myalgias   Simvastatin     Myalgias   Tequin [Gatifloxacin]    Zoloft [Sertraline Hcl]    Zithromax [Azithromycin] Palpitations    Heart race    ROS Review of Systems  Constitutional: Negative.   HENT: Negative.    Eyes: Negative.   Respiratory: Negative.         Tender on the left side of the chest no nausea or vomiting no shortness of breath chest x-ray was reviewed.  Cardiovascular: Negative.   Gastrointestinal: Negative.   Endocrine: Negative.   Genitourinary: Negative.   Musculoskeletal: Negative.   Skin: Negative.   Allergic/Immunologic: Negative.   Neurological: Negative.   Hematological: Negative.   Psychiatric/Behavioral: Negative.    All other systems reviewed and are negative.    Objective:    Physical Exam Vitals reviewed.  Constitutional:      Appearance: Normal appearance. She is normal weight.    HENT:     Mouth/Throat:     Mouth: Mucous membranes are moist.  Eyes:     Pupils: Pupils are equal, round, and reactive to light.  Neck:     Vascular: No carotid bruit.  Cardiovascular:     Rate and Rhythm: Normal rate and regular  rhythm.     Pulses: Normal pulses.     Heart sounds: Normal heart sounds.  Pulmonary:     Effort: Pulmonary effort is normal.     Breath sounds: Normal breath sounds.  Chest:    Abdominal:     General: Bowel sounds are normal.  Palpations: Abdomen is soft. There is no hepatomegaly, splenomegaly or mass.     Tenderness: There is no abdominal tenderness.     Hernia: No hernia is present.  Musculoskeletal:        General: No tenderness.     Cervical back: Neck supple.     Right lower leg: No edema.     Left lower leg: No edema.  Skin:    Findings: No rash.  Neurological:     Mental Status: She is alert and oriented to person, place, and time.     Motor: No weakness.  Psychiatric:        Mood and Affect: Mood and affect normal.        Behavior: Behavior normal.    BP (!) 141/70   Pulse 74   Ht 5\' 3"  (1.6 m)   Wt 144 lb 11.2 oz (65.6 kg)   BMI 25.63 kg/m  Wt Readings from Last 3 Encounters:  07/21/21 144 lb 11.2 oz (65.6 kg)  06/02/21 142 lb (64.4 kg)  03/09/21 146 lb 4.8 oz (66.4 kg)     Health Maintenance Due  Topic Date Due   FOOT EXAM  Never done   Hepatitis C Screening  Never done   TETANUS/TDAP  Never done   Zoster Vaccines- Shingrix (1 of 2) Never done   COLONOSCOPY (Pts 45-78yrs Insurance coverage will need to be confirmed)  Never done   MAMMOGRAM  Never done   DEXA SCAN  Never done   COVID-19 Vaccine (3 - Pfizer risk series) 03/11/2020   PNA vac Low Risk Adult (2 of 2 - PCV13) 07/30/2020   HEMOGLOBIN A1C  11/11/2020   INFLUENZA VACCINE  07/12/2021    There are no preventive care reminders to display for this patient.  Lab Results  Component Value Date   TSH 4.41 07/22/2014   Lab Results  Component Value Date   WBC 5.2 05/12/2020   HGB 12.1 05/12/2020   HCT 36.3 05/12/2020   MCV 89.2 05/12/2020   PLT 174 05/12/2020   Lab Results  Component Value Date   NA 140 05/12/2020   K 5.0 05/12/2020   CO2 19 (L) 05/12/2020   GLUCOSE 149 (H)  05/12/2020   BUN 19 05/12/2020   CREATININE 0.88 05/12/2020   BILITOT 0.7 05/17/2021   ALKPHOS 45 05/17/2021   AST 19 05/17/2021   ALT 14 05/17/2021   PROT 6.9 05/17/2021   ALBUMIN 4.4 05/17/2021   CALCIUM 9.5 05/12/2020   ANIONGAP 11 10/29/2019   Lab Results  Component Value Date   CHOL 138 05/17/2021   Lab Results  Component Value Date   HDL 49 05/17/2021   Lab Results  Component Value Date   LDLCALC 26 05/17/2021   Lab Results  Component Value Date   TRIG 313 (H) 05/17/2021   Lab Results  Component Value Date   CHOLHDL 2.8 05/17/2021   Lab Results  Component Value Date   HGBA1C 5.9 (H) 05/12/2020      Assessment & Plan:   Problem List Items Addressed This Visit       Cardiovascular and Mediastinum   Non-ST elevation (NSTEMI) myocardial infarction [facility])    Patient denies any chest pain or shortness of       Essential hypertension - Primary     Patient denies any chest pain or shortness of breath there is no history of palpitation or paroxysmal nocturnal dyspnea   patient was advised to follow low-salt low-cholesterol diet  ideally I want to keep systolic blood pressure below 213 mmHg, patient was asked to check blood pressure one times a week and give me a report on that.  Patient will be follow-up in 3 months  or earlier as needed, patient will call me back for any change in the cardiovascular symptoms Patient was advised to buy a book from local bookstore concerning blood pressure and read several chapters  every day.  This will be supplemented by some of the material we will give him from the office.  Patient should also utilize other resources like YouTube and Internet to learn more about the blood pressure and the diet.         Digestive   Calculus of gallbladder without cholecystitis without obstruction     Endocrine   Diabetes mellitus (HCC)    Diabetes under control        No orders of the defined types were placed in this  encounter.   Follow-up: No follow-ups on file.    Corky Downs, MD

## 2021-07-27 ENCOUNTER — Other Ambulatory Visit: Payer: Self-pay

## 2021-07-27 ENCOUNTER — Encounter: Payer: Self-pay | Admitting: Internal Medicine

## 2021-07-27 ENCOUNTER — Ambulatory Visit (INDEPENDENT_AMBULATORY_CARE_PROVIDER_SITE_OTHER): Payer: Medicare HMO | Admitting: Internal Medicine

## 2021-07-27 VITALS — BP 144/60 | HR 79 | Ht 63.0 in | Wt 144.0 lb

## 2021-07-27 DIAGNOSIS — R0602 Shortness of breath: Secondary | ICD-10-CM | POA: Diagnosis not present

## 2021-07-27 DIAGNOSIS — I10 Essential (primary) hypertension: Secondary | ICD-10-CM | POA: Diagnosis not present

## 2021-07-27 DIAGNOSIS — R0781 Pleurodynia: Secondary | ICD-10-CM

## 2021-07-27 DIAGNOSIS — E782 Mixed hyperlipidemia: Secondary | ICD-10-CM | POA: Diagnosis not present

## 2021-07-27 NOTE — Assessment & Plan Note (Signed)
Patient has a chest pain in the left side due to rib fracture abdomen is soft nontender without any hepatosplenomegaly

## 2021-07-27 NOTE — Assessment & Plan Note (Signed)
Blood pressure stable ? ?

## 2021-07-27 NOTE — Progress Notes (Signed)
Established Patient Office Visit  Subjective:  Patient ID: Donna Harper, female    DOB: June 02, 1951  Age: 70 y.o. MRN: [id number]  CC:  Chief Complaint  Patient presents with   Follow-up    HPI  Donna Harper presents for follow-up on the left-sided chest pain due to rib fracture.  She denies any history of nausea vomiting syncope, there is no swelling of the legs or swelling of the abdomen.  Past Medical History:  Diagnosis Date   CAD (coronary artery disease)    a. 07/2019 NSTEMI/PCI: LM 15d, LAD 40p, 50/76m, RI nl, LCX nl, OM1 small, RCA large, 30p, 28m (3.0x12 Resolute Onyx DES), RPDA fills via L->R collats (OM1), EF 45-50%; b. 11/2020 MV: EF>65%, no ischemia/infact-->low risk.   GERD (gastroesophageal reflux disease)    Hyperlipidemia LDL goal <70    Hypertension    Ischemic cardiomyopathy    a. 07/30/2019 LV gram: EF 45-50%, basal inf HK; b. 07/31/2019 Echo: EF 55-60%, impaired relaxation, sev basal inf HK. Nl RV fxn.    Joint pain    Murmur    a. 07/2019 Echo: no significant valvular abnormalities. Mild AoV thickening.   TIA (transient ischemic attack) 08/2014   Type II diabetes mellitus (HCC)     Past Surgical History:  Procedure Laterality Date   BREAST BIOPSY Bilateral 1988   benign/Dr Sankar   CARDIAC CATHETERIZATION     CESAREAN SECTION  1986   CORONARY STENT INTERVENTION N/A 07/30/2019   Procedure: CORONARY STENT INTERVENTION;  Surgeon: [name], MD;  Location: [facility];  Service: Cardiovascular;  Laterality: N/A;  RCA   LEFT HEART CATH AND CORONARY ANGIOGRAPHY N/A 07/30/2019   Procedure: LEFT HEART CATH AND CORONARY ANGIOGRAPHY;  Surgeon: [name], MD;  Location: [facility];  Service: Cardiovascular;  Laterality: N/A;    Family History  Problem Relation Age of Onset   COPD Mother    COPD Father     Social History   Socioeconomic History   Marital status: Married    Spouse name: Not on file   Number of children:  Not on file   Years of education: Not on file   Highest education level: Not on file  Occupational History   Not on file  Tobacco Use   Smoking status: Former   Smokeless tobacco: Never   Tobacco comments:    Quit 30 years ago  Vaping Use   Vaping Use: Never used  Substance and Sexual Activity   Alcohol use: No    Alcohol/week: 0.0 standard drinks   Drug use: No   Sexual activity: Not on file  Other Topics Concern   Not on file  Social History Narrative   Not on file   Social Determinants of Health   Financial Resource Strain: Not on file  Food Insecurity: Not on file  Transportation Needs: Not on file  Physical Activity: Not on file  Stress: Not on file  Social Connections: Not on file  Intimate Partner Violence: Not on file     Current Outpatient Medications:    Alirocumab (PRALUENT) 150 MG/ML SOAJ, Inject 150 mg into the skin every 14 (fourteen) days., Disp: 14 mL, Rfl: 2   aspirin 81 MG tablet, Take 81 mg by mouth daily., Disp: , Rfl:    cholecalciferol (VITAMIN D) 1000 UNITS tablet, Take 1,000 Units by mouth daily., Disp: , Rfl:    diphenhydrAMINE (BENADRYL) 25 MG tablet, Take 25 mg by mouth at bedtime.,  Disp: , Rfl:    doxycycline (VIBRA-TABS) 100 MG tablet, Take 1 tablet (100 mg total) by mouth 2 (two) times daily., Disp: 20 tablet, Rfl: 0   fenofibrate 54 MG tablet, Take 1 tablet (54 mg total) by mouth daily., Disp: 90 tablet, Rfl: 3   glipiZIDE (GLUCOTROL XL) 5 MG 24 hr tablet, Take 1 tablet (5 mg total) by mouth daily., Disp: 90 tablet, Rfl: 3   losartan (COZAAR) 100 MG tablet, TAKE 1 TABLET BY MOUTH DAILY, Disp: 90 tablet, Rfl: 1   metFORMIN (GLUCOPHAGE) 1000 MG tablet, Take 1 tablet (1,000 mg total) by mouth 2 (two) times daily., Disp: 180 tablet, Rfl: 3   metoprolol tartrate (LOPRESSOR) 50 MG tablet, TAKE 1 TABLET(50 MG) BY MOUTH TWICE DAILY, Disp: 60 tablet, Rfl: 2   Multiple Vitamin (MULTIVITAMIN WITH MINERALS) TABS tablet, Take 1 tablet by mouth daily.,  Disp: , Rfl:    NASONEX 50 MCG/ACT nasal spray, Place 2 sprays into the nose daily. , Disp: , Rfl: 0   nitroGLYCERIN (NITROSTAT) 0.4 MG SL tablet, Place 1 tablet (0.4 mg total) under the tongue every 5 (five) minutes as needed for chest pain., Disp: 90 tablet, Rfl: 3   sitaGLIPtin (JANUVIA) 50 MG tablet, Take 1 tablet (50 mg total) by mouth daily., Disp: 30 tablet, Rfl: 6   vitamin E 400 UNIT capsule, Take 400 Units by mouth daily., Disp: , Rfl:    Allergies  Allergen Reactions   Atorvastatin     Myalgias   Erythromycin     Stomach Problems'    Levaquin [Levofloxacin In D5w]    Penicillins     Heart races   Rosuvastatin     Myalgias   Simvastatin     Myalgias   Tequin [Gatifloxacin]    Zoloft [Sertraline Hcl]    Zithromax [Azithromycin] Palpitations    Heart race    ROS Review of Systems  Constitutional: Negative.   HENT: Negative.    Eyes: Negative.   Respiratory:         Chest pain on the left side with bruising under the left breast  Cardiovascular: Negative.   Gastrointestinal: Negative.   Endocrine: Negative.   Genitourinary: Negative.   Musculoskeletal: Negative.   Skin: Negative.   Allergic/Immunologic: Negative.   Neurological: Negative.   Hematological: Negative.   Psychiatric/Behavioral: Negative.    All other systems reviewed and are negative.    Objective:    Physical Exam Vitals reviewed.  Constitutional:      Appearance: Normal appearance.  HENT:     Mouth/Throat:     Mouth: Mucous membranes are moist.  Eyes:     Pupils: Pupils are equal, round, and reactive to light.  Neck:     Vascular: No carotid bruit.  Cardiovascular:     Rate and Rhythm: Normal rate and regular rhythm.     Pulses: Normal pulses.     Heart sounds: Normal heart sounds.  Pulmonary:     Effort: Pulmonary effort is normal.     Breath sounds: Normal breath sounds.  Chest:       Comments: Tenderness in the left side of the chest Abdominal:     General: Bowel sounds  are normal.     Palpations: Abdomen is soft. There is no hepatomegaly, splenomegaly or mass.     Tenderness: There is no abdominal tenderness.     Hernia: No hernia is present.  Musculoskeletal:        General: No tenderness.  Cervical back: Neck supple.     Right lower leg: No edema.     Left lower leg: No edema.  Skin:    Findings: No rash.  Neurological:     Mental Status: She is alert and oriented to person, place, and time.     Motor: No weakness.  Psychiatric:        Mood and Affect: Mood and affect normal.        Behavior: Behavior normal.    BP (!) 144/60   Pulse 79   Ht 5\' 3"  (1.6 m)   Wt 144 lb (65.3 kg)   BMI 25.51 kg/m  Wt Readings from Last 3 Encounters:  07/27/21 144 lb (65.3 kg)  07/21/21 144 lb 11.2 oz (65.6 kg)  06/02/21 142 lb (64.4 kg)     Health Maintenance Due  Topic Date Due   FOOT EXAM  Never done   Hepatitis C Screening  Never done   TETANUS/TDAP  Never done   Zoster Vaccines- Shingrix (1 of 2) Never done   COLONOSCOPY (Pts 45-97yrs Insurance coverage will need to be confirmed)  Never done   MAMMOGRAM  Never done   DEXA SCAN  Never done   COVID-19 Vaccine (3 - Pfizer risk series) 03/11/2020   PNA vac Low Risk Adult (2 of 2 - PCV13) 07/30/2020   HEMOGLOBIN A1C  11/11/2020   INFLUENZA VACCINE  07/12/2021    There are no preventive care reminders to display for this patient.  Lab Results  Component Value Date   TSH 4.41 07/22/2014   Lab Results  Component Value Date   WBC 5.2 05/12/2020   HGB 12.1 05/12/2020   HCT 36.3 05/12/2020   MCV 89.2 05/12/2020   PLT 174 05/12/2020   Lab Results  Component Value Date   NA 140 05/12/2020   K 5.0 05/12/2020   CO2 19 (L) 05/12/2020   GLUCOSE 149 (H) 05/12/2020   BUN 19 05/12/2020   CREATININE 0.88 05/12/2020   BILITOT 0.7 05/17/2021   ALKPHOS 45 05/17/2021   AST 19 05/17/2021   ALT 14 05/17/2021   PROT 6.9 05/17/2021   ALBUMIN 4.4 05/17/2021   CALCIUM 9.5 05/12/2020   ANIONGAP 11  10/29/2019   Lab Results  Component Value Date   CHOL 138 05/17/2021   Lab Results  Component Value Date   HDL 49 05/17/2021   Lab Results  Component Value Date   LDLCALC 26 05/17/2021   Lab Results  Component Value Date   TRIG 313 (H) 05/17/2021   Lab Results  Component Value Date   CHOLHDL 2.8 05/17/2021   Lab Results  Component Value Date   HGBA1C 5.9 (H) 05/12/2020      Assessment & Plan:   Problem List Items Addressed This Visit       Cardiovascular and Mediastinum   Essential hypertension    Blood pressure stable        Other   Chest pain - Primary    Patient has a chest pain in the left side due to rib fracture abdomen is soft nontender without any hepatosplenomegaly      Shortness of breath    Patient is not having any shortness of breath on exertion      Mixed hyperlipidemia    Advised to follow low-cholesterol diet       No orders of the defined types were placed in this encounter.   Follow-up: No follow-ups on file.    Corky Downs, MD

## 2021-07-27 NOTE — Assessment & Plan Note (Signed)
Advised to follow low cholesterol diet 

## 2021-07-27 NOTE — Assessment & Plan Note (Signed)
Patient is not having any shortness of breath on exertion

## 2021-07-30 ENCOUNTER — Telehealth: Payer: Self-pay | Admitting: Internal Medicine

## 2021-07-30 NOTE — Telephone Encounter (Signed)
Notified pt I am reaching out to pharmD for assistance to see what options are available.  Pt appreciative.

## 2021-07-30 NOTE — Telephone Encounter (Signed)
Pt c/o medication issue:  1. Name of Medication: praluent  2. How are you currently taking this medication (dosage and times per day)? 150 mg inj q 14 days   3. Are you having a reaction (difficulty breathing--STAT)? no  4. What is your medication issue? Unable to use card given for pharmacy cost patient wants to discuss re applying for assistance to health well foundation.

## 2021-08-02 NOTE — Telephone Encounter (Signed)
[employer] is currently closed due to lack of funding. Do not have other options to lower her Praluent copay. Ideally wish for her to continue on therapy, she would be responsible for whatever copay the pharmacy is charging her after her insurance is billed.  Previously seen by Dr Debara Pickett for lipid management. Reported intolerances include atorvastatin, rosuvastatin, and pravastatin.  If Praluent copay is cost prohibitive, could start her on ezetimibe '10mg'$  daily instead (less effective but generic so the cost would be lower). I can also add her to my running list of pts to call once the [employer] reopens.

## 2021-08-03 NOTE — Telephone Encounter (Signed)
Patient did not pick up medication Wants to discuss getting another option  Please call

## 2021-08-03 NOTE — Telephone Encounter (Signed)
Spoke with pt.  She did attempt to continue Praluent, however, out of pocket cost is unaffordable at this time.  Pt very disappointed as she has no side effects with Praluent and wishes to continue.  Per message below, no other options to lower her Praluent copay.  Pt with statin intolerance.  She will be unable to continue Praluent at this time.  Notified pt I will make Dr. Saunders Revel aware and will let her know what he recommends.

## 2021-08-03 NOTE — Telephone Encounter (Signed)
I agree with Ms. Supple's recommendation of ezetimbe 10 mg daily if she is unable to afford Praluent.  She should be sure to reapply for health well grant when available in 2023.  It may also be worthwhile to check with the drug manufacturer to see if she would qualify for any assistance with the medication cost.  Nelva Bush, MD [facility]

## 2021-08-03 NOTE — Telephone Encounter (Signed)
Spoke to pt. Notified of information below.  Pt will have pharmacy fill Praluent and pay copay at this time. Pt states "when I reach the donut hole, I will likely need to try another option to replace Praluent." Pt will contact office when/if cost becomes an issue with Praluent.

## 2021-08-04 NOTE — Telephone Encounter (Signed)
Patient returning missed call. 

## 2021-08-04 NOTE — Telephone Encounter (Signed)
Attempted to call pt to discuss recc below.  No answer at this time. Lmtcb.

## 2021-08-05 ENCOUNTER — Other Ambulatory Visit: Payer: Self-pay | Admitting: Nurse Practitioner

## 2021-08-05 NOTE — Telephone Encounter (Signed)
Patient returning call  States that she is going to have purulent filled even though she cannot afford and just take once a month instead of twice a month Please call to discuss Says she is ok to wait til Jinny Blossom returns Friday

## 2021-08-06 NOTE — Telephone Encounter (Signed)
Spoke with pt to discuss further.  Pt proceeded to tell me that she called her pharmacy this morning and copay is no longer $150, now copay will be $47.  Pt confirms that she will be able to continue Praluent as prescribed twice a month.

## 2021-08-07 ENCOUNTER — Other Ambulatory Visit: Payer: Self-pay | Admitting: Internal Medicine

## 2021-08-10 ENCOUNTER — Encounter: Payer: Self-pay | Admitting: Internal Medicine

## 2021-08-10 ENCOUNTER — Ambulatory Visit (INDEPENDENT_AMBULATORY_CARE_PROVIDER_SITE_OTHER): Payer: Medicare HMO | Admitting: Internal Medicine

## 2021-08-10 ENCOUNTER — Other Ambulatory Visit: Payer: Self-pay

## 2021-08-10 VITALS — BP 125/67 | HR 73 | Ht 63.0 in | Wt 144.1 lb

## 2021-08-10 DIAGNOSIS — R0781 Pleurodynia: Secondary | ICD-10-CM | POA: Diagnosis not present

## 2021-08-10 DIAGNOSIS — K8020 Calculus of gallbladder without cholecystitis without obstruction: Secondary | ICD-10-CM

## 2021-08-10 DIAGNOSIS — E1169 Type 2 diabetes mellitus with other specified complication: Secondary | ICD-10-CM

## 2021-08-10 DIAGNOSIS — I2510 Atherosclerotic heart disease of native coronary artery without angina pectoris: Secondary | ICD-10-CM | POA: Diagnosis not present

## 2021-08-10 DIAGNOSIS — I10 Essential (primary) hypertension: Secondary | ICD-10-CM

## 2021-08-10 NOTE — Assessment & Plan Note (Signed)

## 2021-08-10 NOTE — Assessment & Plan Note (Signed)
No episode of gallbladder colic

## 2021-08-10 NOTE — Assessment & Plan Note (Signed)

## 2021-08-10 NOTE — Assessment & Plan Note (Signed)
Stable at the present time patient denies any chest pain or shortness of breath.

## 2021-08-10 NOTE — Progress Notes (Signed)
Established Patient Office Visit  Subjective:  Patient ID: Donna Harper, female    DOB: 12/29/1950  Age: 70 y.o. MRN: [id number]  CC:  Chief Complaint  Patient presents with   Follow-up    HPI  Verbena P Conine presents for follow up on lt sided chest contusion  Past Medical History:  Diagnosis Date   CAD (coronary artery disease)    a. 07/2019 NSTEMI/PCI: LM 15d, LAD 40p, 50/69m, RI nl, LCX nl, OM1 small, RCA large, 30p, 49m (3.0x12 Resolute Onyx DES), RPDA fills via L->R collats (OM1), EF 45-50%; b. 11/2020 MV: EF>65%, no ischemia/infact-->low risk.   GERD (gastroesophageal reflux disease)    Hyperlipidemia LDL goal <70    Hypertension    Ischemic cardiomyopathy    a. 07/30/2019 LV gram: EF 45-50%, basal inf HK; b. 07/31/2019 Echo: EF 55-60%, impaired relaxation, sev basal inf HK. Nl RV fxn.    Joint pain    Murmur    a. 07/2019 Echo: no significant valvular abnormalities. Mild AoV thickening.   TIA (transient ischemic attack) 08/2014   Type II diabetes mellitus (HCC)     Past Surgical History:  Procedure Laterality Date   BREAST BIOPSY Bilateral 1988   benign/Dr Sankar   CARDIAC CATHETERIZATION     CESAREAN SECTION  1986   CORONARY STENT INTERVENTION N/A 07/30/2019   Procedure: CORONARY STENT INTERVENTION;  Surgeon: [name], MD;  Location: [facility];  Service: Cardiovascular;  Laterality: N/A;  RCA   LEFT HEART CATH AND CORONARY ANGIOGRAPHY N/A 07/30/2019   Procedure: LEFT HEART CATH AND CORONARY ANGIOGRAPHY;  Surgeon: [name], MD;  Location: [facility];  Service: Cardiovascular;  Laterality: N/A;    Family History  Problem Relation Age of Onset   COPD Mother    COPD Father     Social History   Socioeconomic History   Marital status: Married    Spouse name: Not on file   Number of children: Not on file   Years of education: Not on file   Highest education level: Not on file  Occupational History   Not on file  Tobacco  Use   Smoking status: Former   Smokeless tobacco: Never   Tobacco comments:    Quit 30 years ago  Vaping Use   Vaping Use: Never used  Substance and Sexual Activity   Alcohol use: No    Alcohol/week: 0.0 standard drinks   Drug use: No   Sexual activity: Not on file  Other Topics Concern   Not on file  Social History Narrative   Not on file   Social Determinants of Health   Financial Resource Strain: Not on file  Food Insecurity: Not on file  Transportation Needs: Not on file  Physical Activity: Not on file  Stress: Not on file  Social Connections: Not on file  Intimate Partner Violence: Not on file     Current Outpatient Medications:    Alirocumab (PRALUENT) 150 MG/ML SOAJ, INJECT 150 MG INTO THE SKIN EVERY 14 DAYS, Disp: 2 mL, Rfl: 11   aspirin 81 MG tablet, Take 81 mg by mouth daily., Disp: , Rfl:    cholecalciferol (VITAMIN D) 1000 UNITS tablet, Take 1,000 Units by mouth daily., Disp: , Rfl:    diphenhydrAMINE (BENADRYL) 25 MG tablet, Take 25 mg by mouth at bedtime., Disp: , Rfl:    fenofibrate 54 MG tablet, Take 1 tablet (54 mg total) by mouth daily., Disp: 90 tablet, Rfl: 3  glipiZIDE (GLUCOTROL XL) 5 MG 24 hr tablet, Take 1 tablet (5 mg total) by mouth daily., Disp: 90 tablet, Rfl: 3   losartan (COZAAR) 100 MG tablet, TAKE 1 TABLET BY MOUTH DAILY, Disp: 90 tablet, Rfl: 1   metFORMIN (GLUCOPHAGE) 1000 MG tablet, Take 1 tablet (1,000 mg total) by mouth 2 (two) times daily., Disp: 180 tablet, Rfl: 3   metoprolol tartrate (LOPRESSOR) 50 MG tablet, TAKE 1 TABLET(50 MG) BY MOUTH TWICE DAILY, Disp: 60 tablet, Rfl: 4   Multiple Vitamin (MULTIVITAMIN WITH MINERALS) TABS tablet, Take 1 tablet by mouth daily., Disp: , Rfl:    NASONEX 50 MCG/ACT nasal spray, Place 2 sprays into the nose daily. , Disp: , Rfl: 0   nitroGLYCERIN (NITROSTAT) 0.4 MG SL tablet, Place 1 tablet (0.4 mg total) under the tongue every 5 (five) minutes as needed for chest pain., Disp: 90 tablet, Rfl: 3    sitaGLIPtin (JANUVIA) 50 MG tablet, Take 1 tablet (50 mg total) by mouth daily., Disp: 30 tablet, Rfl: 6   vitamin E 400 UNIT capsule, Take 400 Units by mouth daily., Disp: , Rfl:    Allergies  Allergen Reactions   Atorvastatin     Myalgias   Erythromycin     Stomach Problems'    Levaquin [Levofloxacin In D5w]    Penicillins     Heart races   Rosuvastatin     Myalgias   Simvastatin     Myalgias   Tequin [Gatifloxacin]    Zoloft [Sertraline Hcl]    Zithromax [Azithromycin] Palpitations    Heart race    ROS Review of Systems  Constitutional: Negative.   HENT: Negative.    Eyes: Negative.   Respiratory: Negative.    Cardiovascular: Negative.   Gastrointestinal: Negative.   Endocrine: Negative.   Genitourinary: Negative.   Musculoskeletal: Negative.   Skin: Negative.   Allergic/Immunologic: Negative.   Neurological: Negative.   Hematological: Negative.   Psychiatric/Behavioral: Negative.    All other systems reviewed and are negative.    Objective:    Physical Exam Vitals reviewed.  Constitutional:      Appearance: Normal appearance.  HENT:     Mouth/Throat:     Mouth: Mucous membranes are moist.  Eyes:     Pupils: Pupils are equal, round, and reactive to light.  Neck:     Vascular: No carotid bruit.  Cardiovascular:     Rate and Rhythm: Normal rate and regular rhythm.     Pulses: Normal pulses.     Heart sounds: Normal heart sounds.  Pulmonary:     Effort: Pulmonary effort is normal.     Breath sounds: Normal breath sounds.  Abdominal:     General: Bowel sounds are normal.     Palpations: Abdomen is soft. There is no hepatomegaly, splenomegaly or mass.     Tenderness: There is no abdominal tenderness.     Hernia: No hernia is present.  Musculoskeletal:        General: No tenderness.     Cervical back: Neck supple.     Right lower leg: No edema.     Left lower leg: No edema.  Skin:    Findings: No rash.  Neurological:     Mental Status: She is  alert and oriented to person, place, and time.     Motor: No weakness.  Psychiatric:        Mood and Affect: Mood and affect normal.        Behavior: Behavior normal.  BP 125/67   Pulse 73   Ht 5\' 3"  (1.6 m)   Wt 144 lb 1.6 oz (65.4 kg)   BMI 25.53 kg/m  Wt Readings from Last 3 Encounters:  08/10/21 144 lb 1.6 oz (65.4 kg)  07/27/21 144 lb (65.3 kg)  07/21/21 144 lb 11.2 oz (65.6 kg)     Health Maintenance Due  Topic Date Due   FOOT EXAM  Never done   Hepatitis C Screening  Never done   TETANUS/TDAP  Never done   Zoster Vaccines- Shingrix (1 of 2) Never done   COLONOSCOPY (Pts 45-51yrs Insurance coverage will need to be confirmed)  Never done   MAMMOGRAM  Never done   DEXA SCAN  Never done   COVID-19 Vaccine (3 - Pfizer risk series) 03/11/2020   PNA vac Low Risk Adult (2 of 2 - PCV13) 07/30/2020   HEMOGLOBIN A1C  11/11/2020   INFLUENZA VACCINE  07/12/2021    There are no preventive care reminders to display for this patient.  Lab Results  Component Value Date   TSH 4.41 07/22/2014   Lab Results  Component Value Date   WBC 5.2 05/12/2020   HGB 12.1 05/12/2020   HCT 36.3 05/12/2020   MCV 89.2 05/12/2020   PLT 174 05/12/2020   Lab Results  Component Value Date   NA 140 05/12/2020   K 5.0 05/12/2020   CO2 19 (L) 05/12/2020   GLUCOSE 149 (H) 05/12/2020   BUN 19 05/12/2020   CREATININE 0.88 05/12/2020   BILITOT 0.7 05/17/2021   ALKPHOS 45 05/17/2021   AST 19 05/17/2021   ALT 14 05/17/2021   PROT 6.9 05/17/2021   ALBUMIN 4.4 05/17/2021   CALCIUM 9.5 05/12/2020   ANIONGAP 11 10/29/2019   Lab Results  Component Value Date   CHOL 138 05/17/2021   Lab Results  Component Value Date   HDL 49 05/17/2021   Lab Results  Component Value Date   LDLCALC 26 05/17/2021   Lab Results  Component Value Date   TRIG 313 (H) 05/17/2021   Lab Results  Component Value Date   CHOLHDL 2.8 05/17/2021   Lab Results  Component Value Date   HGBA1C 5.9 (H)  05/12/2020      Assessment & Plan:   Problem List Items Addressed This Visit       Cardiovascular and Mediastinum   Coronary artery disease involving native coronary artery of native heart without angina pectoris - Primary    Stable at the present time patient denies any chest pain or shortness of breath.      Essential hypertension     Patient denies any chest pain or shortness of breath there is no history of palpitation or paroxysmal nocturnal dyspnea   patient was advised to follow low-salt low-cholesterol diet    ideally I want to keep systolic blood pressure below 161 mmHg, patient was asked to check blood pressure one times a week and give me a report on that.  Patient will be follow-up in 3 months  or earlier as needed, patient will call me back for any change in the cardiovascular symptoms Patient was advised to buy a book from local bookstore concerning blood pressure and read several chapters  every day.  This will be supplemented by some of the material we will give him from the office.  Patient should also utilize other resources like YouTube and Internet to learn more about the blood pressure and the diet.  Digestive   Calculus of gallbladder without cholecystitis without obstruction    No episode of gallbladder colic        Endocrine   Diabetes mellitus (HCC)    - The patient's blood sugar is labile on med. - The patient will continue the current treatment regimen.  - I encouraged the patient to regularly check blood sugar.  - I encouraged the patient to monitor diet. I encouraged the patient to eat low-carb and low-sugar to help prevent blood sugar spikes.  - I encouraged the patient to continue following their prescribed treatment plan for diabetes - I informed the patient to get help if blood sugar drops below 54mg /dL, or if suddenly have trouble thinking clearly or breathing.  Patient was advised to buy a book on diabetes from a local bookstore or from  [place].  Patient should read 2 chapters every day to keep the motivation going, this is in addition to some of the materials we provided them from the office.  There are other resources on the Internet like YouTube and [employer]kip[employer] to get an education on the diabetes        Other   Chest pain    Left-sided chest pain due to rib fracture has resolved       No orders of the defined types were placed in this encounter.   Follow-up: No follow-ups on file.    Corky Downs, MD

## 2021-08-10 NOTE — Assessment & Plan Note (Signed)
Left-sided chest pain due to rib fracture has resolved

## 2021-08-17 ENCOUNTER — Ambulatory Visit (INDEPENDENT_AMBULATORY_CARE_PROVIDER_SITE_OTHER): Payer: Medicare HMO | Admitting: Internal Medicine

## 2021-08-17 ENCOUNTER — Other Ambulatory Visit: Payer: Self-pay

## 2021-08-17 ENCOUNTER — Encounter: Payer: Self-pay | Admitting: Internal Medicine

## 2021-08-17 VITALS — BP 125/62 | HR 68 | Temp 99.0°F | Ht 63.0 in | Wt 144.2 lb

## 2021-08-17 DIAGNOSIS — I10 Essential (primary) hypertension: Secondary | ICD-10-CM

## 2021-08-17 DIAGNOSIS — J329 Chronic sinusitis, unspecified: Secondary | ICD-10-CM | POA: Diagnosis not present

## 2021-08-17 DIAGNOSIS — E1169 Type 2 diabetes mellitus with other specified complication: Secondary | ICD-10-CM

## 2021-08-17 DIAGNOSIS — K8020 Calculus of gallbladder without cholecystitis without obstruction: Secondary | ICD-10-CM

## 2021-08-17 DIAGNOSIS — Z20822 Contact with and (suspected) exposure to covid-19: Secondary | ICD-10-CM

## 2021-08-17 DIAGNOSIS — R0602 Shortness of breath: Secondary | ICD-10-CM

## 2021-08-17 DIAGNOSIS — J40 Bronchitis, not specified as acute or chronic: Secondary | ICD-10-CM | POA: Diagnosis not present

## 2021-08-17 DIAGNOSIS — E782 Mixed hyperlipidemia: Secondary | ICD-10-CM

## 2021-08-17 LAB — POC COVID19 BINAXNOW: SARS Coronavirus 2 Ag: NEGATIVE

## 2021-08-17 MED ORDER — DOXYCYCLINE HYCLATE 100 MG PO TABS: 100.0000 mg | ORAL_TABLET | Freq: Two times a day (BID) | ORAL | 1 refills | Status: DC

## 2021-08-17 NOTE — Assessment & Plan Note (Signed)
Hypercholesterolemia  I advised the patient to follow Mediterranean diet This diet is rich in fruits vegetables and whole grain, and This diet is also rich in fish and lean meat Patient should also eat a handful of almonds or walnuts daily Recent heart study indicated that average follow-up on this kind of diet reduces the cardiovascular mortality by 50 to 70%== 

## 2021-08-17 NOTE — Assessment & Plan Note (Signed)
Gallbladder calculus is silent at the present time

## 2021-08-17 NOTE — Assessment & Plan Note (Signed)
Shortness of breath is due to wheezing due to upper respiratory infection COVID-negative throat is not congested chest with bilateral wheezing started on doxycycline

## 2021-08-17 NOTE — Assessment & Plan Note (Signed)
Patient has bronchitis with wheezing, COVID test is negative, patient was started on doxycycline for 7 days

## 2021-08-17 NOTE — Assessment & Plan Note (Signed)

## 2021-08-17 NOTE — Assessment & Plan Note (Signed)

## 2021-08-17 NOTE — Progress Notes (Signed)
Established Patient Office Visit  Subjective:  Patient ID: Donna Harper, female    DOB: 12/25/1950  Age: 70 y.o. MRN: [id number]  CC: No chief complaint on file.   HPI  Elesha P Beswick presents for uri Past Medical History:  Diagnosis Date   CAD (coronary artery disease)    a. 07/2019 NSTEMI/PCI: LM 15d, LAD 40p, 50/85m, RI nl, LCX nl, OM1 small, RCA large, 30p, 3m (3.0x12 Resolute Onyx DES), RPDA fills via L->R collats (OM1), EF 45-50%; b. 11/2020 MV: EF>65%, no ischemia/infact-->low risk.   GERD (gastroesophageal reflux disease)    Hyperlipidemia LDL goal <70    Hypertension    Ischemic cardiomyopathy    a. 07/30/2019 LV gram: EF 45-50%, basal inf HK; b. 07/31/2019 Echo: EF 55-60%, impaired relaxation, sev basal inf HK. Nl RV fxn.    Joint pain    Murmur    a. 07/2019 Echo: no significant valvular abnormalities. Mild AoV thickening.   TIA (transient ischemic attack) 08/2014   Type II diabetes mellitus (HCC)     Past Surgical History:  Procedure Laterality Date   BREAST BIOPSY Bilateral 1988   benign/Dr Sankar   CARDIAC CATHETERIZATION     CESAREAN SECTION  1986   CORONARY STENT INTERVENTION N/A 07/30/2019   Procedure: CORONARY STENT INTERVENTION;  Surgeon: [name], MD;  Location: [facility];  Service: Cardiovascular;  Laterality: N/A;  RCA   LEFT HEART CATH AND CORONARY ANGIOGRAPHY N/A 07/30/2019   Procedure: LEFT HEART CATH AND CORONARY ANGIOGRAPHY;  Surgeon: [name], MD;  Location: [facility];  Service: Cardiovascular;  Laterality: N/A;    Family History  Problem Relation Age of Onset   COPD Mother    COPD Father     Social History   Socioeconomic History   Marital status: Married    Spouse name: Not on file   Number of children: Not on file   Years of education: Not on file   Highest education level: Not on file  Occupational History   Not on file  Tobacco Use   Smoking status: Former   Smokeless tobacco: Never    Tobacco comments:    Quit 30 years ago  Vaping Use   Vaping Use: Never used  Substance and Sexual Activity   Alcohol use: No    Alcohol/week: 0.0 standard drinks   Drug use: No   Sexual activity: Not on file  Other Topics Concern   Not on file  Social History Narrative   Not on file   Social Determinants of Health   Financial Resource Strain: Not on file  Food Insecurity: Not on file  Transportation Needs: Not on file  Physical Activity: Not on file  Stress: Not on file  Social Connections: Not on file  Intimate Partner Violence: Not on file     Current Outpatient Medications:    doxycycline (VIBRA-TABS) 100 MG tablet, Take 1 tablet (100 mg total) by mouth 2 (two) times daily for 7 days., Disp: 14 tablet, Rfl: 1   Alirocumab (PRALUENT) 150 MG/ML SOAJ, INJECT 150 MG INTO THE SKIN EVERY 14 DAYS, Disp: 2 mL, Rfl: 11   aspirin 81 MG tablet, Take 81 mg by mouth daily., Disp: , Rfl:    cholecalciferol (VITAMIN D) 1000 UNITS tablet, Take 1,000 Units by mouth daily., Disp: , Rfl:    diphenhydrAMINE (BENADRYL) 25 MG tablet, Take 25 mg by mouth at bedtime., Disp: , Rfl:    fenofibrate 54 MG tablet, Take  1 tablet (54 mg total) by mouth daily., Disp: 90 tablet, Rfl: 3   glipiZIDE (GLUCOTROL XL) 5 MG 24 hr tablet, Take 1 tablet (5 mg total) by mouth daily., Disp: 90 tablet, Rfl: 3   losartan (COZAAR) 100 MG tablet, TAKE 1 TABLET BY MOUTH DAILY, Disp: 90 tablet, Rfl: 1   metFORMIN (GLUCOPHAGE) 1000 MG tablet, Take 1 tablet (1,000 mg total) by mouth 2 (two) times daily., Disp: 180 tablet, Rfl: 3   metoprolol tartrate (LOPRESSOR) 50 MG tablet, TAKE 1 TABLET(50 MG) BY MOUTH TWICE DAILY, Disp: 60 tablet, Rfl: 4   Multiple Vitamin (MULTIVITAMIN WITH MINERALS) TABS tablet, Take 1 tablet by mouth daily., Disp: , Rfl:    NASONEX 50 MCG/ACT nasal spray, Place 2 sprays into the nose daily. , Disp: , Rfl: 0   nitroGLYCERIN (NITROSTAT) 0.4 MG SL tablet, Place 1 tablet (0.4 mg total) under the tongue  every 5 (five) minutes as needed for chest pain., Disp: 90 tablet, Rfl: 3   sitaGLIPtin (JANUVIA) 50 MG tablet, Take 1 tablet (50 mg total) by mouth daily., Disp: 30 tablet, Rfl: 6   vitamin E 400 UNIT capsule, Take 400 Units by mouth daily., Disp: , Rfl:    Allergies  Allergen Reactions   Atorvastatin     Myalgias   Erythromycin     Stomach Problems'    Levaquin [Levofloxacin In D5w]    Penicillins     Heart races   Rosuvastatin     Myalgias   Simvastatin     Myalgias   Tequin [Gatifloxacin]    Zoloft [Sertraline Hcl]    Zithromax [Azithromycin] Palpitations    Heart race    ROS Review of Systems  Constitutional:  Positive for fatigue. Negative for chills.  HENT:  Positive for congestion. Negative for ear discharge and mouth sores.   Eyes: Negative.   Respiratory:  Positive for cough, chest tightness, shortness of breath and wheezing.   Cardiovascular: Negative.   Gastrointestinal: Negative.   Endocrine: Negative.   Genitourinary: Negative.   Musculoskeletal: Negative.   Skin: Negative.   Allergic/Immunologic: Negative.   Neurological: Negative.   Hematological: Negative.   Psychiatric/Behavioral: Negative.    All other systems reviewed and are negative.    Objective:    Physical Exam Vitals reviewed.  Constitutional:      Appearance: Normal appearance.  HENT:     Mouth/Throat:     Mouth: Mucous membranes are moist.  Eyes:     Pupils: Pupils are equal, round, and reactive to light.  Neck:     Vascular: No carotid bruit.  Cardiovascular:     Rate and Rhythm: Normal rate and regular rhythm.     Pulses: Normal pulses.     Heart sounds: Normal heart sounds.  Pulmonary:     Effort: Pulmonary effort is normal.     Breath sounds: Wheezing and rhonchi present.  Abdominal:     General: Bowel sounds are normal.     Palpations: Abdomen is soft. There is no hepatomegaly, splenomegaly or mass.     Tenderness: There is no abdominal tenderness.     Hernia: No  hernia is present.  Musculoskeletal:        General: No tenderness.     Cervical back: Neck supple.     Right lower leg: No edema.     Left lower leg: No edema.  Skin:    Findings: No rash.  Neurological:     Mental Status: She is alert and oriented  to person, place, and time.     Motor: No weakness.  Psychiatric:        Mood and Affect: Mood and affect normal.        Behavior: Behavior normal.    BP 125/62   Pulse 68   Temp 99 F (37.2 C)   Ht 5\' 3"  (1.6 m)   Wt 144 lb 3.2 oz (65.4 kg)   BMI 25.54 kg/m  Wt Readings from Last 3 Encounters:  08/17/21 144 lb 3.2 oz (65.4 kg)  08/10/21 144 lb 1.6 oz (65.4 kg)  07/27/21 144 lb (65.3 kg)     Health Maintenance Due  Topic Date Due   FOOT EXAM  Never done   Hepatitis C Screening  Never done   TETANUS/TDAP  Never done   Zoster Vaccines- Shingrix (1 of 2) Never done   COLONOSCOPY (Pts 45-67yrs Insurance coverage will need to be confirmed)  Never done   MAMMOGRAM  Never done   DEXA SCAN  Never done   COVID-19 Vaccine (3 - Pfizer risk series) 03/11/2020   PNA vac Low Risk Adult (2 of 2 - PCV13) 07/30/2020   HEMOGLOBIN A1C  11/11/2020   INFLUENZA VACCINE  07/12/2021    There are no preventive care reminders to display for this patient.  Lab Results  Component Value Date   TSH 4.41 07/22/2014   Lab Results  Component Value Date   WBC 5.2 05/12/2020   HGB 12.1 05/12/2020   HCT 36.3 05/12/2020   MCV 89.2 05/12/2020   PLT 174 05/12/2020   Lab Results  Component Value Date   NA 140 05/12/2020   K 5.0 05/12/2020   CO2 19 (L) 05/12/2020   GLUCOSE 149 (H) 05/12/2020   BUN 19 05/12/2020   CREATININE 0.88 05/12/2020   BILITOT 0.7 05/17/2021   ALKPHOS 45 05/17/2021   AST 19 05/17/2021   ALT 14 05/17/2021   PROT 6.9 05/17/2021   ALBUMIN 4.4 05/17/2021   CALCIUM 9.5 05/12/2020   ANIONGAP 11 10/29/2019   Lab Results  Component Value Date   CHOL 138 05/17/2021   Lab Results  Component Value Date   HDL 49  05/17/2021   Lab Results  Component Value Date   LDLCALC 26 05/17/2021   Lab Results  Component Value Date   TRIG 313 (H) 05/17/2021   Lab Results  Component Value Date   CHOLHDL 2.8 05/17/2021   Lab Results  Component Value Date   HGBA1C 5.9 (H) 05/12/2020      Assessment & Plan:   Problem List Items Addressed This Visit       Cardiovascular and Mediastinum   Essential hypertension     Patient denies any chest pain or shortness of breath there is no history of palpitation or paroxysmal nocturnal dyspnea   patient was advised to follow low-salt low-cholesterol diet    ideally I want to keep systolic blood pressure below 811 mmHg, patient was asked to check blood pressure one times a week and give me a report on that.  Patient will be follow-up in 3 months  or earlier as needed, patient will call me back for any change in the cardiovascular symptoms Patient was advised to buy a book from local bookstore concerning blood pressure and read several chapters  every day.  This will be supplemented by some of the material we will give him from the office.  Patient should also utilize other resources like YouTube and Internet to learn more about  the blood pressure and the diet.        Respiratory   Sinusitis   Relevant Medications   doxycycline (VIBRA-TABS) 100 MG tablet   Bronchitis    Patient has bronchitis with wheezing, COVID test is negative, patient was started on doxycycline for 7 days        Digestive   Calculus of gallbladder without cholecystitis without obstruction    Gallbladder calculus is silent at the present time        Endocrine   Diabetes mellitus (HCC)    - The patient's blood sugar is labile on med. - The patient will continue the current treatment regimen.  - I encouraged the patient to regularly check blood sugar.  - I encouraged the patient to monitor diet. I encouraged the patient to eat low-carb and low-sugar to help prevent blood sugar spikes.   - I encouraged the patient to continue following their prescribed treatment plan for diabetes - I informed the patient to get help if blood sugar drops below 54mg /dL, or if suddenly have trouble thinking clearly or breathing.  Patient was advised to buy a book on diabetes from a local bookstore or from [place].  Patient should read 2 chapters every day to keep the motivation going, this is in addition to some of the materials we provided them from the office.  There are other resources on the Internet like YouTube and wilkipedia to get an education on the diabetes        Other   Shortness of breath    Shortness of breath is due to wheezing due to upper respiratory infection COVID-negative throat is not congested chest with bilateral wheezing started on doxycycline      Mixed hyperlipidemia    Hypercholesterolemia  I advised the patient to follow Mediterranean diet This diet is rich in fruits vegetables and whole grain, and This diet is also rich in fish and lean meat Patient should also eat a handful of almonds or walnuts daily Recent heart study indicated that average follow-up on this kind of diet reduces the cardiovascular mortality by 50 to 70%==      Other Visit Diagnoses     Suspected COVID-19 virus infection    -  Primary   Relevant Orders   POC COVID-19 (Completed)       Meds ordered this encounter  Medications   doxycycline (VIBRA-TABS) 100 MG tablet    Sig: Take 1 tablet (100 mg total) by mouth 2 (two) times daily for 7 days.    Dispense:  14 tablet    Refill:  1    Follow-up: No follow-ups on file.    Corky Downs, MD

## 2021-08-23 ENCOUNTER — Encounter: Payer: Self-pay | Admitting: Internal Medicine

## 2021-08-23 ENCOUNTER — Ambulatory Visit (INDEPENDENT_AMBULATORY_CARE_PROVIDER_SITE_OTHER): Payer: Medicare HMO | Admitting: Internal Medicine

## 2021-08-23 ENCOUNTER — Other Ambulatory Visit: Payer: Self-pay

## 2021-08-23 VITALS — BP 119/56 | HR 67 | Ht 63.0 in | Wt 143.7 lb

## 2021-08-23 DIAGNOSIS — E1169 Type 2 diabetes mellitus with other specified complication: Secondary | ICD-10-CM

## 2021-08-23 DIAGNOSIS — R0602 Shortness of breath: Secondary | ICD-10-CM | POA: Diagnosis not present

## 2021-08-23 DIAGNOSIS — I2510 Atherosclerotic heart disease of native coronary artery without angina pectoris: Secondary | ICD-10-CM | POA: Diagnosis not present

## 2021-08-23 DIAGNOSIS — J40 Bronchitis, not specified as acute or chronic: Secondary | ICD-10-CM | POA: Diagnosis not present

## 2021-08-23 DIAGNOSIS — I10 Essential (primary) hypertension: Secondary | ICD-10-CM | POA: Diagnosis not present

## 2021-08-23 NOTE — Assessment & Plan Note (Addendum)
Will start on Z-Pak

## 2021-08-23 NOTE — Assessment & Plan Note (Signed)

## 2021-08-23 NOTE — Assessment & Plan Note (Signed)
Patient is on statin.

## 2021-08-23 NOTE — Progress Notes (Signed)
Established Patient Office Visit  Subjective:  Patient ID: Donna Harper, female    DOB: 10-25-1951  Age: 70 y.o. MRN: [id number]  CC: No chief complaint on file.   HPI  Donna Harper presents for her bronchitis  Past Medical History:  Diagnosis Date   CAD (coronary artery disease)    a. 07/2019 NSTEMI/PCI: LM 15d, LAD 40p, 50/71m, RI nl, LCX nl, OM1 small, RCA large, 30p, 14m (3.0x12 Resolute Onyx DES), RPDA fills via L->R collats (OM1), EF 45-50%; b. 11/2020 MV: EF>65%, no ischemia/infact-->low risk.   GERD (gastroesophageal reflux disease)    Hyperlipidemia LDL goal <70    Hypertension    Ischemic cardiomyopathy    a. 07/30/2019 LV gram: EF 45-50%, basal inf HK; b. 07/31/2019 Echo: EF 55-60%, impaired relaxation, sev basal inf HK. Nl RV fxn.    Joint pain    Murmur    a. 07/2019 Echo: no significant valvular abnormalities. Mild AoV thickening.   TIA (transient ischemic attack) 08/2014   Type II diabetes mellitus (HCC)     Past Surgical History:  Procedure Laterality Date   BREAST BIOPSY Bilateral 1988   benign/Dr Sankar   CARDIAC CATHETERIZATION     CESAREAN SECTION  1986   CORONARY STENT INTERVENTION N/A 07/30/2019   Procedure: CORONARY STENT INTERVENTION;  Surgeon: [name], MD;  Location: [facility];  Service: Cardiovascular;  Laterality: N/A;  RCA   LEFT HEART CATH AND CORONARY ANGIOGRAPHY N/A 07/30/2019   Procedure: LEFT HEART CATH AND CORONARY ANGIOGRAPHY;  Surgeon: [name], MD;  Location: [facility];  Service: Cardiovascular;  Laterality: N/A;    Family History  Problem Relation Age of Onset   COPD Mother    COPD Father     Social History   Socioeconomic History   Marital status: Married    Spouse name: Not on file   Number of children: Not on file   Years of education: Not on file   Highest education level: Not on file  Occupational History   Not on file  Tobacco Use   Smoking status: Former   Smokeless tobacco:  Never   Tobacco comments:    Quit 30 years ago  Vaping Use   Vaping Use: Never used  Substance and Sexual Activity   Alcohol use: No    Alcohol/week: 0.0 standard drinks   Drug use: No   Sexual activity: Not on file  Other Topics Concern   Not on file  Social History Narrative   Not on file   Social Determinants of Health   Financial Resource Strain: Not on file  Food Insecurity: Not on file  Transportation Needs: Not on file  Physical Activity: Not on file  Stress: Not on file  Social Connections: Not on file  Intimate Partner Violence: Not on file     Current Outpatient Medications:    Alirocumab (PRALUENT) 150 MG/ML SOAJ, INJECT 150 MG INTO THE SKIN EVERY 14 DAYS, Disp: 2 mL, Rfl: 11   aspirin 81 MG tablet, Take 81 mg by mouth daily., Disp: , Rfl:    cholecalciferol (VITAMIN D) 1000 UNITS tablet, Take 1,000 Units by mouth daily., Disp: , Rfl:    diphenhydrAMINE (BENADRYL) 25 MG tablet, Take 25 mg by mouth at bedtime., Disp: , Rfl:    doxycycline (VIBRA-TABS) 100 MG tablet, Take 1 tablet (100 mg total) by mouth 2 (two) times daily for 7 days., Disp: 14 tablet, Rfl: 1   fenofibrate 54 MG  tablet, Take 1 tablet (54 mg total) by mouth daily., Disp: 90 tablet, Rfl: 3   glipiZIDE (GLUCOTROL XL) 5 MG 24 hr tablet, Take 1 tablet (5 mg total) by mouth daily., Disp: 90 tablet, Rfl: 3   losartan (COZAAR) 100 MG tablet, TAKE 1 TABLET BY MOUTH DAILY, Disp: 90 tablet, Rfl: 1   metFORMIN (GLUCOPHAGE) 1000 MG tablet, Take 1 tablet (1,000 mg total) by mouth 2 (two) times daily., Disp: 180 tablet, Rfl: 3   metoprolol tartrate (LOPRESSOR) 50 MG tablet, TAKE 1 TABLET(50 MG) BY MOUTH TWICE DAILY, Disp: 60 tablet, Rfl: 4   Multiple Vitamin (MULTIVITAMIN WITH MINERALS) TABS tablet, Take 1 tablet by mouth daily., Disp: , Rfl:    NASONEX 50 MCG/ACT nasal spray, Place 2 sprays into the nose daily. , Disp: , Rfl: 0   nitroGLYCERIN (NITROSTAT) 0.4 MG SL tablet, Place 1 tablet (0.4 mg total) under the  tongue every 5 (five) minutes as needed for chest pain., Disp: 90 tablet, Rfl: 3   sitaGLIPtin (JANUVIA) 50 MG tablet, Take 1 tablet (50 mg total) by mouth daily., Disp: 30 tablet, Rfl: 6   vitamin E 400 UNIT capsule, Take 400 Units by mouth daily., Disp: , Rfl:    Allergies  Allergen Reactions   Atorvastatin     Myalgias   Erythromycin     Stomach Problems'    Levaquin [Levofloxacin In D5w]    Penicillins     Heart races   Rosuvastatin     Myalgias   Simvastatin     Myalgias   Tequin [Gatifloxacin]    Zoloft [Sertraline Hcl]    Zithromax [Azithromycin] Palpitations    Heart race    ROS Review of Systems  Constitutional: Negative.   HENT: Negative.    Eyes: Negative.   Respiratory:  Positive for cough.   Cardiovascular: Negative.   Gastrointestinal: Negative.   Endocrine: Negative.   Genitourinary: Negative.   Musculoskeletal: Negative.   Skin: Negative.   Allergic/Immunologic: Negative.   Neurological: Negative.   Hematological: Negative.   Psychiatric/Behavioral: Negative.    All other systems reviewed and are negative.    Objective:    Physical Exam Vitals reviewed.  Constitutional:      Appearance: Normal appearance.  HENT:     Mouth/Throat:     Mouth: Mucous membranes are moist.  Eyes:     Pupils: Pupils are equal, round, and reactive to light.  Neck:     Vascular: No carotid bruit.  Cardiovascular:     Rate and Rhythm: Normal rate and regular rhythm.     Pulses: Normal pulses.     Heart sounds: Normal heart sounds.  Pulmonary:     Effort: Pulmonary effort is normal.     Breath sounds: Wheezing present.  Abdominal:     General: Bowel sounds are normal.     Palpations: Abdomen is soft. There is no hepatomegaly, splenomegaly or mass.     Tenderness: There is no abdominal tenderness.     Hernia: No hernia is present.  Musculoskeletal:        General: No tenderness.     Cervical back: Neck supple.     Right lower leg: No edema.     Left lower  leg: No edema.  Skin:    Findings: No rash.  Neurological:     Mental Status: She is alert and oriented to person, place, and time.     Motor: No weakness.  Psychiatric:  Mood and Affect: Mood and affect normal.        Behavior: Behavior normal.    BP (!) 119/56   Pulse 67   Ht 5\' 3"  (1.6 m)   Wt 143 lb 11.2 oz (65.2 kg)   HC 63" (160 cm)   BMI 25.46 kg/m  Wt Readings from Last 3 Encounters:  08/23/21 143 lb 11.2 oz (65.2 kg)  08/17/21 144 lb 3.2 oz (65.4 kg)  08/10/21 144 lb 1.6 oz (65.4 kg)     Health Maintenance Due  Topic Date Due   FOOT EXAM  Never done   Hepatitis C Screening  Never done   TETANUS/TDAP  Never done   Zoster Vaccines- Shingrix (1 of 2) Never done   COLONOSCOPY (Pts 45-88yrs Insurance coverage will need to be confirmed)  Never done   MAMMOGRAM  Never done   DEXA SCAN  Never done   COVID-19 Vaccine (3 - Pfizer risk series) 03/11/2020   PNA vac Low Risk Adult (2 of 2 - PCV13) 07/30/2020   HEMOGLOBIN A1C  11/11/2020   INFLUENZA VACCINE  07/12/2021    There are no preventive care reminders to display for this patient.  Lab Results  Component Value Date   TSH 4.41 07/22/2014   Lab Results  Component Value Date   WBC 5.2 05/12/2020   HGB 12.1 05/12/2020   HCT 36.3 05/12/2020   MCV 89.2 05/12/2020   PLT 174 05/12/2020   Lab Results  Component Value Date   NA 140 05/12/2020   K 5.0 05/12/2020   CO2 19 (L) 05/12/2020   GLUCOSE 149 (H) 05/12/2020   BUN 19 05/12/2020   CREATININE 0.88 05/12/2020   BILITOT 0.7 05/17/2021   ALKPHOS 45 05/17/2021   AST 19 05/17/2021   ALT 14 05/17/2021   PROT 6.9 05/17/2021   ALBUMIN 4.4 05/17/2021   CALCIUM 9.5 05/12/2020   ANIONGAP 11 10/29/2019   Lab Results  Component Value Date   CHOL 138 05/17/2021   Lab Results  Component Value Date   HDL 49 05/17/2021   Lab Results  Component Value Date   LDLCALC 26 05/17/2021   Lab Results  Component Value Date   TRIG 313 (H) 05/17/2021   Lab  Results  Component Value Date   CHOLHDL 2.8 05/17/2021   Lab Results  Component Value Date   HGBA1C 5.9 (H) 05/12/2020      Assessment & Plan:   Problem List Items Addressed This Visit       Cardiovascular and Mediastinum   Coronary artery disease involving native coronary artery of native heart without angina pectoris - Primary    Patient is on statin      Essential hypertension    The following hypertensive lifestyle modification were recommended and discussed:  1. Limiting alcohol intake to less than 1 oz/day of ethanol:(24 oz of beer or 8 oz of wine or 2 oz of 100-proof whiskey). 2. Take baby ASA 81 mg daily. 3. Importance of regular aerobic exercise and losing weight. 4. Reduce dietary saturated fat and cholesterol intake for overall cardiovascular health. 5. Maintaining adequate dietary potassium, calcium, and magnesium intake. 6. Regular monitoring of the blood pressure. 7. Reduce sodium intake to less than 100 mmol/day (less than 2.3 gm of sodium or less than 6 gm of sodium choride)         Respiratory   Bronchitis    Will start on Z-Pak  Endocrine   Diabetes mellitus (HCC)    - The patient's blood sugar is labile on med. - The patient will continue the current treatment regimen.  - I encouraged the patient to regularly check blood sugar.  - I encouraged the patient to monitor diet. I encouraged the patient to eat low-carb and low-sugar to help prevent blood sugar spikes.  - I encouraged the patient to continue following their prescribed treatment plan for diabetes - I informed the patient to get help if blood sugar drops below 54mg /dL, or if suddenly have trouble thinking clearly or breathing.  Patient was advised to buy a book on diabetes from a local bookstore or from [place].  Patient should read 2 chapters every day to keep the motivation going, this is in addition to some of the materials we provided them from the office.  There  are other resources on the Internet like YouTube and wilkipedia to get an education on the diabetes        Other   Shortness of breath    Shortness of breath is due to acute bronchitis, will get a chest x-ray if she is not better       No orders of the defined types were placed in this encounter.   Follow-up: No follow-ups on file.    Corky Downs, MD

## 2021-08-23 NOTE — Assessment & Plan Note (Signed)
Shortness of breath is due to acute bronchitis, will get a chest x-ray if she is not better

## 2021-08-23 NOTE — Assessment & Plan Note (Signed)

## 2021-08-24 ENCOUNTER — Other Ambulatory Visit: Payer: Self-pay | Admitting: *Deleted

## 2021-08-24 MED ORDER — DOXYCYCLINE HYCLATE 100 MG PO TABS: 100.0000 mg | ORAL_TABLET | Freq: Two times a day (BID) | ORAL | 1 refills | Status: AC

## 2021-09-03 ENCOUNTER — Ambulatory Visit (INDEPENDENT_AMBULATORY_CARE_PROVIDER_SITE_OTHER): Payer: Medicare HMO

## 2021-09-03 DIAGNOSIS — Z0000 Encounter for general adult medical examination without abnormal findings: Secondary | ICD-10-CM

## 2021-09-03 NOTE — Progress Notes (Signed)
I have reviewed this visit and agree with the documentation.   

## 2021-09-03 NOTE — Progress Notes (Signed)
Subjective:   Donna Harper is a 70 y.o. female who presents for Medicare Annual (Subsequent) preventive examination. Visit completed by audio.  Patient location: Home  Provider location: Home  Review of Systems    N/A Cardiac Risk Factors include: none     Objective:    There were no vitals filed for this visit. There is no height or weight on file to calculate BMI.  Advanced Directives 09/03/2021 08/26/2019 07/30/2019 07/30/2019 07/29/2019  Does Patient Have a Medical Advance Directive? No No - - No  Would patient like information on creating a medical advance directive? - No - Patient declined Yes (MAU/Ambulatory/Procedural Areas - Information given) No - Patient declined -    Current Medications (verified) Outpatient Encounter Medications as of 09/03/2021  Medication Sig   Alirocumab (PRALUENT) 150 MG/ML SOAJ INJECT 150 MG INTO THE SKIN EVERY 14 DAYS   aspirin 81 MG tablet Take 81 mg by mouth daily.   cholecalciferol (VITAMIN D) 1000 UNITS tablet Take 1,000 Units by mouth daily.   fenofibrate 54 MG tablet Take 1 tablet (54 mg total) by mouth daily.   glipiZIDE (GLUCOTROL XL) 5 MG 24 hr tablet Take 1 tablet (5 mg total) by mouth daily.   losartan (COZAAR) 100 MG tablet TAKE 1 TABLET BY MOUTH DAILY   metFORMIN (GLUCOPHAGE) 1000 MG tablet Take 1 tablet (1,000 mg total) by mouth 2 (two) times daily.   metoprolol tartrate (LOPRESSOR) 50 MG tablet TAKE 1 TABLET(50 MG) BY MOUTH TWICE DAILY   Multiple Vitamin (MULTIVITAMIN WITH MINERALS) TABS tablet Take 1 tablet by mouth daily.   NASONEX 50 MCG/ACT nasal spray Place 2 sprays into the nose daily.    nitroGLYCERIN (NITROSTAT) 0.4 MG SL tablet Place 1 tablet (0.4 mg total) under the tongue every 5 (five) minutes as needed for chest pain.   sitaGLIPtin (JANUVIA) 50 MG tablet Take 1 tablet (50 mg total) by mouth daily.   vitamin E 400 UNIT capsule Take 400 Units by mouth daily.   [DISCONTINUED] diphenhydrAMINE (BENADRYL) 25 MG tablet  Take 25 mg by mouth at bedtime. (Patient not taking: Reported on 09/03/2021)   No facility-administered encounter medications on file as of 09/03/2021.    Allergies (verified) Atorvastatin, Erythromycin, Levaquin [levofloxacin in d5w], Penicillins, Rosuvastatin, Simvastatin, Tequin [gatifloxacin], Zoloft [sertraline hcl], and Zithromax [azithromycin]   History: Past Medical History:  Diagnosis Date   CAD (coronary artery disease)    a. 07/2019 NSTEMI/PCI: LM 15d, LAD 40p, 50/12m, RI nl, LCX nl, OM1 small, RCA large, 30p, 13m (3.0x12 Resolute Onyx DES), RPDA fills via L->R collats (OM1), EF 45-50%; b. 11/2020 MV: EF>65%, no ischemia/infact-->low risk.   GERD (gastroesophageal reflux disease)    Hyperlipidemia LDL goal <70    Hypertension    Ischemic cardiomyopathy    a. 07/30/2019 LV gram: EF 45-50%, basal inf HK; b. 07/31/2019 Echo: EF 55-60%, impaired relaxation, sev basal inf HK. Nl RV fxn.    Joint pain    Murmur    a. 07/2019 Echo: no significant valvular abnormalities. Mild AoV thickening.   TIA (transient ischemic attack) 08/2014   Type II diabetes mellitus ([place])    Past Surgical History:  Procedure Laterality Date   BREAST BIOPSY Bilateral 1988   benign/Dr [place]   CORONARY STENT INTERVENTION N/A 07/30/2019   Procedure: CORONARY STENT INTERVENTION;  Surgeon: Nelva Bush, MD;  Location: [facility];  Service: Cardiovascular;  Laterality: N/A;  RCA   LEFT HEART CATH AND CORONARY ANGIOGRAPHY N/A 07/30/2019   Procedure: LEFT HEART CATH AND CORONARY ANGIOGRAPHY;  Surgeon: Nelva Bush, MD;  Location: [facility];  Service: Cardiovascular;  Laterality: N/A;   Family History  Problem Relation Age of Onset   COPD Mother    COPD Father    Social History   Socioeconomic History   Marital status: Married    Spouse name: Donna Harper   Number of children: 3   Years of education: Engineer, water education level: Some college, no degree  Occupational History   Not on file  Tobacco Use   Smoking status: Former   Smokeless tobacco: Never   Tobacco comments:    Quit 30 years ago  Vaping Use   Vaping Use: Never used  Substance and Sexual Activity   Alcohol use: No    Alcohol/week: 0.0 standard drinks   Drug use: No   Sexual activity: Yes  Other Topics Concern   Not on file  Social History Narrative   Not on file   Social Determinants of Health   Financial Resource Strain: Low Risk    Difficulty of Paying Living Expenses: Not very hard  Food Insecurity: Not on file  Transportation Needs: No Transportation Needs   Lack of Transportation (Medical): No   Lack of Transportation (Non-Medical): No  Physical Activity: Insufficiently Active   Days of Exercise per Week: 7 days   Minutes of Exercise per Session: 20 min  Stress: No Stress Concern Present   Feeling of Stress : Not at all  Social Connections: Socially Integrated   Frequency of Communication with Friends and Family: More than three times a week   Frequency of Social Gatherings with Friends and Family: More than three times a week   Attends Religious Services: More than 4 times per year   Active Member of [employer] or Organizations: Yes   Attends Music therapist: More than 4 times per year   Marital Status: Married    Tobacco Counseling Counseling given: Not Answered Tobacco comments: Quit 30 years ago   Clinical Intake:  Pre-visit preparation completed: Yes  Pain : No/denies pain     Diabetes: Yes  How often do you need to have someone help you when you read instructions, pamphlets, or other written materials from your doctor or pharmacy?: 1 - Never What is the last grade level you completed in school?: College Graduate  Diabetic? Yes  Interpreter Needed?: No  Information entered by :: Anson [place] CMA   Activities of Daily Living In your present state of health, do you  have any difficulty performing the following activities: 09/03/2021 08/10/2021  Hearing? Y N  Comment Pt sees Dr. Farrel Conners -  Vision? N N  Difficulty concentrating or making decisions? N N  Walking or climbing stairs? N N  Dressing or bathing? N N  Doing errands, shopping? N N  Preparing Food and eating ? N -  Using the Toilet? N -  In the past six months, have you accidently leaked urine? N -  Do you have problems with loss of bowel control? N -  Managing your Medications? N -  Managing your Finances? N -  Housekeeping or managing your Housekeeping? N -  Some recent data might be hidden    Patient Care Team: Cletis Athens, MD as PCP - General (Internal Medicine) End, Harrell Gave, MD as PCP - Cardiology (Cardiology) Laurey Morale Wonda Cheng, MD (Unknown Physician Specialty)  Christene Lye, MD (General Surgery)  Indicate any recent Medical Services you may have received from other than [facility] providers in the past year (date may be approximate).     Assessment:   This is a routine wellness examination for Donna Harper.  Hearing/Vision screen No results found.  Dietary issues and exercise activities discussed: Current Exercise Habits: Home exercise routine, Type of exercise: walking, Time (Minutes): 20, Frequency (Times/Week): 7, Weekly Exercise (Minutes/Week): 140, Intensity: Mild, Exercise limited by: None identified   Goals Addressed   None    Depression Screen PHQ 2/9 Scores 09/03/2021 08/23/2021 07/21/2021 09/24/2019 08/27/2019  PHQ - 2 Score 0 0 0 1 2  PHQ- 9 Score - - - 5 5    Fall Risk Fall Risk  09/03/2021 08/23/2021 07/21/2021 08/26/2019  Falls in the past year? 1 1 1  0  Number falls in past yr: 0 0 0 -  Injury with Fall? 1 1 1  -  Risk for fall due to : Other (Comment) - History of fall(s) -  Follow up Falls evaluation completed Falls evaluation completed Falls evaluation completed -    FALL RISK PREVENTION PERTAINING TO THE HOME:  Any stairs in or around the home? No  If  so, are there any without handrails? No Home free of loose throw rugs in walkways, pet beds, electrical cords, etc? Yes  Adequate lighting in your home to reduce risk of falls? Yes   ASSISTIVE DEVICES UTILIZED TO PREVENT FALLS:  Life alert? No  Use of a cane, walker or w/c? No  Grab bars in the bathroom? Yes  Shower chair or bench in shower? Yes  Elevated toilet seat or a handicapped toilet? Yes   TIMED UP AND GO:  Was the test performed? No .  Length of time to ambulate 10 feet: 0 sec.     Cognitive Function:     6CIT Screen 09/03/2021  What Year? 0 points  What month? 0 points  What time? 0 points  Count back from 20 0 points  Months in reverse 0 points  Repeat phrase 0 points  Total Score 0    Immunizations Immunization History  Administered Date(s) Administered   Fluad Quad(high Dose 65+) 09/29/2020   Influenza-Unspecified 10/12/2018   PFIZER(Purple Top)SARS-COV-2 Vaccination 01/22/2020, 02/12/2020   Pneumococcal Polysaccharide-23 07/31/2019    TDAP status: Due, Education has been provided regarding the importance of this vaccine. Advised may receive this vaccine at local pharmacy or Health Dept. Aware to provide a copy of the vaccination record if obtained from local pharmacy or Health Dept. Verbalized acceptance and understanding.  Flu Vaccine status: Due, Education has been provided regarding the importance of this vaccine. Advised may receive this vaccine at local pharmacy or Health Dept. Aware to provide a copy of the vaccination record if obtained from local pharmacy or Health Dept. Verbalized acceptance and understanding.  Pneumococcal vaccine status: Due, Education has been provided regarding the importance of this vaccine. Advised may receive this vaccine at local pharmacy or Health Dept. Aware to provide a copy of the vaccination record if obtained from local pharmacy or Health Dept. Verbalized acceptance and understanding.  Covid-19 vaccine status:  Completed vaccines (Patient states she is only going to have the 2 vaccines she has already received)  Qualifies for Shingles Vaccine? Yes   Zostavax completed No   Shingrix Completed?: No.    Education has been provided regarding the importance of this vaccine. Patient has been advised to call insurance company to determine out  of pocket expense if they have not yet received this vaccine. Advised may also receive vaccine at local pharmacy or Health Dept. Verbalized acceptance and understanding.  Screening Tests Health Maintenance  Topic Date Due   FOOT EXAM  Never done   Hepatitis C Screening  Never done   TETANUS/TDAP  Never done   Zoster Vaccines- Shingrix (1 of 2) Never done   COLONOSCOPY (Pts 45-52yrs Insurance coverage will need to be confirmed)  Never done   MAMMOGRAM  Never done   COVID-19 Vaccine (3 - Pfizer risk series) 03/11/2020   HEMOGLOBIN A1C  11/11/2020   INFLUENZA VACCINE  07/12/2021   OPHTHALMOLOGY EXAM  04/15/2022   DEXA SCAN  Completed   HPV VACCINES  Aged Out    Health Maintenance  Health Maintenance Due  Topic Date Due   FOOT EXAM  Never done   Hepatitis C Screening  Never done   TETANUS/TDAP  Never done   Zoster Vaccines- Shingrix (1 of 2) Never done   COLONOSCOPY (Pts 45-28yrs Insurance coverage will need to be confirmed)  Never done   MAMMOGRAM  Never done   COVID-19 Vaccine (3 - Pfizer risk series) 03/11/2020   HEMOGLOBIN A1C  11/11/2020   INFLUENZA VACCINE  07/12/2021    Colorectal cancer screening: Type of screening: Colonoscopy. Completed No. Repeat every 10 years (Patient Declined)  Mammogram status: Completed No. Repeat every year (Patient declined)  Dexa Scan: Completed  Lung Cancer Screening: (Low Dose CT Chest recommended if Age 41-80 years, 30 pack-year currently smoking OR have quit w/in 15years.) does not qualify.   Lung Cancer Screening Referral: N/A  Additional Screening:  Hepatitis C Screening: does not qualify; Completed  No  Vision Screening: Recommended annual ophthalmology exams for early detection of glaucoma and other disorders of the eye. Is the patient up to date with their annual eye exam?  Yes  Who is the provider or what is the name of the office in which the patient attends annual eye exams? [facility] If pt is not established with a provider, would they like to be referred to a provider to establish care? No .   Dental Screening: Recommended annual dental exams for proper oral hygiene  Community Resource Referral / Chronic Care Management: CRR required this visit?  No   CCM required this visit?  No      Plan:     I have personally reviewed and noted the following in the patient's chart:   Medical and social history Use of alcohol, tobacco or illicit drugs  Current medications and supplements including opioid prescriptions.  Functional ability and status Nutritional status Physical activity Advanced directives List of other physicians Hospitalizations, surgeries, and ER visits in previous 12 months Vitals Screenings to include cognitive, depression, and falls Referrals and appointments  In addition, I have reviewed and discussed with patient certain preventive protocols, quality metrics, and best practice recommendations. A written personalized care plan for preventive services as well as general preventive health recommendations were provided to patient.     Renato Gails, [place]   09/03/2021   Nurse Notes: Patient has declined mammogram and colonoscopy at this time. Patient informed of vaccines currently due. No referrals needed at this time.   Donna Harper , Thank you for taking time to come for your Medicare Wellness Visit. I appreciate your ongoing commitment to your health goals. Please review the following plan we discussed and let me know if I can assist you in the future.  These are the goals we discussed:  Goals   None     This is a list of the screening  recommended for you and due dates:  Health Maintenance  Topic Date Due   Complete foot exam   Never done   Hepatitis C Screening: USPSTF Recommendation to screen - Ages 34-79 yo.  Never done   Tetanus Vaccine  Never done   Zoster (Shingles) Vaccine (1 of 2) Never done   Colon Cancer Screening  Never done   Mammogram  Never done   COVID-19 Vaccine (3 - Pfizer risk series) 03/11/2020   Hemoglobin A1C  11/11/2020   Flu Shot  07/12/2021   Eye exam for diabetics  04/15/2022   DEXA scan (bone density measurement)  Completed   HPV Vaccine  Aged Out

## 2021-09-08 ENCOUNTER — Ambulatory Visit: Payer: Medicare HMO | Admitting: Internal Medicine

## 2021-10-18 ENCOUNTER — Other Ambulatory Visit: Payer: Self-pay | Admitting: *Deleted

## 2021-10-18 MED ORDER — GLIPIZIDE ER 5 MG PO TB24
5.0000 mg | ORAL_TABLET | Freq: Every day | ORAL | 3 refills | Status: DC
Start: 2021-10-18 — End: 2022-10-10

## 2021-10-19 ENCOUNTER — Telehealth: Payer: Self-pay

## 2021-10-19 NOTE — Telephone Encounter (Signed)
Called and spoke w/pt and stated that they were approved for the [employer] grant for the praluent and emailed the pt a copy.

## 2021-10-26 ENCOUNTER — Other Ambulatory Visit: Payer: Self-pay

## 2021-10-26 ENCOUNTER — Ambulatory Visit (INDEPENDENT_AMBULATORY_CARE_PROVIDER_SITE_OTHER): Payer: Medicare HMO | Admitting: *Deleted

## 2021-10-26 DIAGNOSIS — Z23 Encounter for immunization: Secondary | ICD-10-CM

## 2021-11-01 ENCOUNTER — Other Ambulatory Visit: Payer: Self-pay | Admitting: *Deleted

## 2021-11-01 MED ORDER — CLOBETASOL PROPIONATE 0.05 % EX CREA: 1.0000 "application " | TOPICAL_CREAM | Freq: Two times a day (BID) | CUTANEOUS | 4 refills | Status: DC

## 2021-11-16 ENCOUNTER — Other Ambulatory Visit: Payer: Self-pay

## 2021-11-16 ENCOUNTER — Ambulatory Visit (INDEPENDENT_AMBULATORY_CARE_PROVIDER_SITE_OTHER): Payer: Medicare HMO | Admitting: Internal Medicine

## 2021-11-16 ENCOUNTER — Encounter: Payer: Self-pay | Admitting: Internal Medicine

## 2021-11-16 VITALS — BP 123/69 | HR 76 | Temp 99.4°F | Ht 63.0 in | Wt 144.3 lb

## 2021-11-16 DIAGNOSIS — I2510 Atherosclerotic heart disease of native coronary artery without angina pectoris: Secondary | ICD-10-CM

## 2021-11-16 DIAGNOSIS — I10 Essential (primary) hypertension: Secondary | ICD-10-CM

## 2021-11-16 DIAGNOSIS — J069 Acute upper respiratory infection, unspecified: Secondary | ICD-10-CM

## 2021-11-16 DIAGNOSIS — E1169 Type 2 diabetes mellitus with other specified complication: Secondary | ICD-10-CM

## 2021-11-16 LAB — POC COVID19 BINAXNOW: SARS Coronavirus 2 Ag: NEGATIVE

## 2021-11-16 MED ORDER — DOXYCYCLINE HYCLATE 100 MG PO TABS: 100.0000 mg | ORAL_TABLET | Freq: Two times a day (BID) | ORAL | 0 refills | Status: DC

## 2021-11-16 NOTE — Assessment & Plan Note (Addendum)
We will start the patient on doxycycline 100 mg p.o. twice a day

## 2021-11-16 NOTE — Assessment & Plan Note (Signed)
Denies any chest pain heart is regular chest decreased breath sound with few rhonchi

## 2021-11-16 NOTE — Assessment & Plan Note (Signed)

## 2021-11-16 NOTE — Progress Notes (Signed)
Established Patient Office Visit  Subjective:  Patient ID: Donna Harper, female    DOB: Oct 28, 1951  Age: 70 y.o. MRN: [id number]  CC:  Chief Complaint  Patient presents with   Cough    Patient complains of cough and chest congestion x 4- 5 days. Slight fever on thurs-fri. Covid test negative.     Cough Associated symptoms include wheezing. Pertinent negatives include no shortness of breath.   Donna Harper presents for check up  Past Medical History:  Diagnosis Date   CAD (coronary artery disease)    a. 07/2019 NSTEMI/PCI: LM 15d, LAD 40p, 50/14m, RI nl, LCX nl, OM1 small, RCA large, 30p, 66m (3.0x12 Resolute Onyx DES), RPDA fills via L->R collats (OM1), EF 45-50%; b. 11/2020 MV: EF>65%, no ischemia/infact-->low risk.   GERD (gastroesophageal reflux disease)    Hyperlipidemia LDL goal <70    Hypertension    Ischemic cardiomyopathy    a. 07/30/2019 LV gram: EF 45-50%, basal inf HK; b. 07/31/2019 Echo: EF 55-60%, impaired relaxation, sev basal inf HK. Nl RV fxn.    Joint pain    Murmur    a. 07/2019 Echo: no significant valvular abnormalities. Mild AoV thickening.   TIA (transient ischemic attack) 08/2014   Type II diabetes mellitus (HCC)     Past Surgical History:  Procedure Laterality Date   BREAST BIOPSY Bilateral 1988   benign/Dr Sankar   CARDIAC CATHETERIZATION     CESAREAN SECTION  1986   CORONARY STENT INTERVENTION N/A 07/30/2019   Procedure: CORONARY STENT INTERVENTION;  Surgeon: [name], MD;  Location: [facility];  Service: Cardiovascular;  Laterality: N/A;  RCA   LEFT HEART CATH AND CORONARY ANGIOGRAPHY N/A 07/30/2019   Procedure: LEFT HEART CATH AND CORONARY ANGIOGRAPHY;  Surgeon: [name], MD;  Location: [facility];  Service: Cardiovascular;  Laterality: N/A;    Family History  Problem Relation Age of Onset   COPD Mother    COPD Father     Social History   Socioeconomic History   Marital status: Married    Spouse  name: Lewanda Biga   Number of children: 3   Years of education: Financial planner education level: Some college, no degree  Occupational History   Not on file  Tobacco Use   Smoking status: Former   Smokeless tobacco: Never   Tobacco comments:    Quit 30 years ago  Vaping Use   Vaping Use: Never used  Substance and Sexual Activity   Alcohol use: No    Alcohol/week: 0.0 standard drinks   Drug use: No   Sexual activity: Yes  Other Topics Concern   Not on file  Social History Narrative   Not on file   Social Determinants of Health   Financial Resource Strain: Low Risk    Difficulty of Paying Living Expenses: Not very hard  Food Insecurity: Not on file  Transportation Needs: No Transportation Needs   Lack of Transportation (Medical): No   Lack of Transportation (Non-Medical): No  Physical Activity: Insufficiently Active   Days of Exercise per Week: 7 days   Minutes of Exercise per Session: 20 min  Stress: No Stress Concern Present   Feeling of Stress : Not at all  Social Connections: Socially Integrated   Frequency of Communication with Friends and Family: More than three times a week   Frequency of Social Gatherings with Friends and Family: More than three times a week   Attends Religious  Services: More than 4 times per year   Active Member of Clubs or Organizations: Yes   Attends Engineer, structural: More than 4 times per year   Marital Status: Married  Catering manager Violence: Not At Risk   Fear of Current or Ex-Partner: No   Emotionally Abused: No   Physically Abused: No   Sexually Abused: No     Current Outpatient Medications:    doxycycline (VIBRA-TABS) 100 MG tablet, Take 1 tablet (100 mg total) by mouth 2 (two) times daily., Disp: 20 tablet, Rfl: 0   Alirocumab (PRALUENT) 150 MG/ML SOAJ, INJECT 150 MG INTO THE SKIN EVERY 14 DAYS, Disp: 2 mL, Rfl: 11   aspirin 81 MG tablet, Take 81 mg by mouth daily., Disp: , Rfl:    cholecalciferol  (VITAMIN D) 1000 UNITS tablet, Take 1,000 Units by mouth daily., Disp: , Rfl:    clobetasol cream (TEMOVATE) 0.05 %, Apply 1 application topically 2 (two) times daily., Disp: 45 g, Rfl: 4   fenofibrate 54 MG tablet, Take 1 tablet (54 mg total) by mouth daily., Disp: 90 tablet, Rfl: 3   glipiZIDE (GLUCOTROL XL) 5 MG 24 hr tablet, Take 1 tablet (5 mg total) by mouth daily., Disp: 90 tablet, Rfl: 3   losartan (COZAAR) 100 MG tablet, TAKE 1 TABLET BY MOUTH DAILY, Disp: 90 tablet, Rfl: 1   metFORMIN (GLUCOPHAGE) 1000 MG tablet, Take 1 tablet (1,000 mg total) by mouth 2 (two) times daily., Disp: 180 tablet, Rfl: 3   metoprolol tartrate (LOPRESSOR) 50 MG tablet, TAKE 1 TABLET(50 MG) BY MOUTH TWICE DAILY, Disp: 60 tablet, Rfl: 4   Multiple Vitamin (MULTIVITAMIN WITH MINERALS) TABS tablet, Take 1 tablet by mouth daily., Disp: , Rfl:    NASONEX 50 MCG/ACT nasal spray, Place 2 sprays into the nose daily. , Disp: , Rfl: 0   nitroGLYCERIN (NITROSTAT) 0.4 MG SL tablet, Place 1 tablet (0.4 mg total) under the tongue every 5 (five) minutes as needed for chest pain., Disp: 90 tablet, Rfl: 3   sitaGLIPtin (JANUVIA) 50 MG tablet, Take 1 tablet (50 mg total) by mouth daily., Disp: 30 tablet, Rfl: 6   vitamin E 400 UNIT capsule, Take 400 Units by mouth daily., Disp: , Rfl:    Allergies  Allergen Reactions   Atorvastatin     Myalgias   Erythromycin     Stomach Problems'    Levaquin [Levofloxacin In D5w]    Penicillins     Heart races   Rosuvastatin     Myalgias   Simvastatin     Myalgias   Tequin [Gatifloxacin]    Zoloft [Sertraline Hcl]    Zithromax [Azithromycin] Palpitations    Heart race    ROS Review of Systems  Constitutional: Negative.   HENT: Negative.    Eyes: Negative.   Respiratory:  Positive for cough, chest tightness and wheezing. Negative for shortness of breath.   Cardiovascular: Negative.   Gastrointestinal: Negative.   Endocrine: Negative.   Genitourinary: Negative.    Musculoskeletal: Negative.   Skin: Negative.   Allergic/Immunologic: Negative.   Neurological: Negative.   Hematological: Negative.   Psychiatric/Behavioral: Negative.    All other systems reviewed and are negative.    Objective:    Physical Exam Vitals reviewed.  Constitutional:      Appearance: Normal appearance.  HENT:     Mouth/Throat:     Mouth: Mucous membranes are moist.  Eyes:     Pupils: Pupils are equal, round, and reactive to  light.  Neck:     Vascular: No carotid bruit.  Cardiovascular:     Rate and Rhythm: Normal rate and regular rhythm.     Pulses: Normal pulses.     Heart sounds: Normal heart sounds.  Pulmonary:     Effort: Pulmonary effort is normal.     Breath sounds: Normal breath sounds.  Abdominal:     General: Bowel sounds are normal.     Palpations: Abdomen is soft. There is no hepatomegaly, splenomegaly or mass.     Tenderness: There is no abdominal tenderness.     Hernia: No hernia is present.  Musculoskeletal:        General: No tenderness.     Cervical back: Neck supple.     Right lower leg: No edema.     Left lower leg: No edema.  Skin:    Findings: No rash.  Neurological:     Mental Status: She is alert and oriented to person, place, and time.     Motor: No weakness.  Psychiatric:        Mood and Affect: Mood and affect normal.        Behavior: Behavior normal.    BP 123/69   Pulse 76   Temp 99.4 F (37.4 C)   Ht 5\' 3"  (1.6 m)   Wt 144 lb 4.8 oz (65.5 kg)   BMI 25.56 kg/m  Wt Readings from Last 3 Encounters:  11/16/21 144 lb 4.8 oz (65.5 kg)  08/23/21 143 lb 11.2 oz (65.2 kg)  08/17/21 144 lb 3.2 oz (65.4 kg)     Health Maintenance Due  Topic Date Due   FOOT EXAM  Never done   Hepatitis C Screening  Never done   TETANUS/TDAP  Never done   Zoster Vaccines- Shingrix (1 of 2) Never done   COLONOSCOPY (Pts 45-84yrs Insurance coverage will need to be confirmed)  Never done   MAMMOGRAM  Never done   COVID-19 Vaccine (3 -  Pfizer risk series) 03/11/2020   Pneumonia Vaccine 46+ Years old (2 - PCV) 07/30/2020   HEMOGLOBIN A1C  11/11/2020    There are no preventive care reminders to display for this patient.  Lab Results  Component Value Date   TSH 4.41 07/22/2014   Lab Results  Component Value Date   WBC 5.2 05/12/2020   HGB 12.1 05/12/2020   HCT 36.3 05/12/2020   MCV 89.2 05/12/2020   PLT 174 05/12/2020   Lab Results  Component Value Date   NA 140 05/12/2020   K 5.0 05/12/2020   CO2 19 (L) 05/12/2020   GLUCOSE 149 (H) 05/12/2020   BUN 19 05/12/2020   CREATININE 0.88 05/12/2020   BILITOT 0.7 05/17/2021   ALKPHOS 45 05/17/2021   AST 19 05/17/2021   ALT 14 05/17/2021   PROT 6.9 05/17/2021   ALBUMIN 4.4 05/17/2021   CALCIUM 9.5 05/12/2020   ANIONGAP 11 10/29/2019   Lab Results  Component Value Date   CHOL 138 05/17/2021   Lab Results  Component Value Date   HDL 49 05/17/2021   Lab Results  Component Value Date   LDLCALC 26 05/17/2021   Lab Results  Component Value Date   TRIG 313 (H) 05/17/2021   Lab Results  Component Value Date   CHOLHDL 2.8 05/17/2021   Lab Results  Component Value Date   HGBA1C 5.9 (H) 05/12/2020      Assessment & Plan:   Problem List Items Addressed This Visit  Cardiovascular and Mediastinum   Coronary artery disease involving native coronary artery of native heart without angina pectoris    Denies any chest pain heart is regular chest decreased breath sound with few rhonchi      Essential hypertension     Patient denies any chest pain or shortness of breath there is no history of palpitation or paroxysmal nocturnal dyspnea   patient was advised to follow low-salt low-cholesterol diet    ideally I want to keep systolic blood pressure below 161 mmHg, patient was asked to check blood pressure one times a week and give me a report on that.  Patient will be follow-up in 3 months  or earlier as needed, patient will call me back for any change  in the cardiovascular symptoms Patient was advised to buy a book from local bookstore concerning blood pressure and read several chapters  every day.  This will be supplemented by some of the material we will give him from the office.  Patient should also utilize other resources like YouTube and Internet to learn more about the blood pressure and the diet.        Respiratory   Viral upper respiratory tract infection - Primary    We will start the patient on doxycycline 100 mg p.o. twice a day      Relevant Medications   doxycycline (VIBRA-TABS) 100 MG tablet   Other Relevant Orders   POC COVID-19 (Completed)     Endocrine   Diabetes mellitus (HCC)    - The patient's blood sugar is labile on med. - The patient will continue the current treatment regimen.  - I encouraged the patient to regularly check blood sugar.  - I encouraged the patient to monitor diet. I encouraged the patient to eat low-carb and low-sugar to help prevent blood sugar spikes.  - I encouraged the patient to continue following their prescribed treatment plan for diabetes - I informed the patient to get help if blood sugar drops below 54mg /dL, or if suddenly have trouble thinking clearly or breathing.  Patient was advised to buy a book on diabetes from a local bookstore or from [place].  Patient should read 2 chapters every day to keep the motivation going, this is in addition to some of the materials we provided them from the office.  There are other resources on the Internet like YouTube and wilkipedia to get an education on the diabetes       Meds ordered this encounter  Medications   doxycycline (VIBRA-TABS) 100 MG tablet    Sig: Take 1 tablet (100 mg total) by mouth 2 (two) times daily.    Dispense:  20 tablet    Refill:  0     Follow-up: No follow-ups on file.    Corky Downs, MD

## 2021-11-16 NOTE — Assessment & Plan Note (Signed)

## 2021-11-22 ENCOUNTER — Other Ambulatory Visit
Admission: RE | Admit: 2021-11-22 | Discharge: 2021-11-22 | Disposition: A | Discharge status: Home / Self Care | Payer: Medicare HMO | Source: Ambulatory Visit | Attending: Internal Medicine | Admitting: Internal Medicine

## 2021-11-22 DIAGNOSIS — E785 Hyperlipidemia, unspecified: Secondary | ICD-10-CM | POA: Insufficient documentation

## 2021-11-22 DIAGNOSIS — E1169 Type 2 diabetes mellitus with other specified complication: Secondary | ICD-10-CM | POA: Insufficient documentation

## 2021-11-22 LAB — LIPID PANEL
Cholesterol: 87 mg/dL (ref 0–200)
HDL: 60 mg/dL (ref >40)
LDL Cholesterol: NEGATIVE mg/dL (ref 0–99)
Total CHOL/HDL Ratio: 1.5 RATIO
Triglycerides: 151 mg/dL — ABNORMAL HIGH (ref <150)
VLDL: 30 mg/dL (ref 0–40)

## 2021-11-22 LAB — ALT: ALT: 16 U/L (ref 0–44)

## 2021-11-23 LAB — HEMOGLOBIN A1C
Hgb A1c MFr Bld: 6 % — ABNORMAL HIGH (ref 4.8–5.6)
Mean Plasma Glucose: 126 mg/dL

## 2021-11-25 ENCOUNTER — Encounter: Payer: Self-pay | Admitting: Internal Medicine

## 2021-11-25 ENCOUNTER — Ambulatory Visit: Payer: Medicare HMO | Admitting: Internal Medicine

## 2021-11-25 ENCOUNTER — Telehealth: Payer: Self-pay | Admitting: Internal Medicine

## 2021-11-25 ENCOUNTER — Other Ambulatory Visit: Payer: Self-pay

## 2021-11-25 VITALS — BP 116/70 | HR 63 | Ht 63.0 in | Wt 143.0 lb

## 2021-11-25 DIAGNOSIS — G720 Drug-induced myopathy: Secondary | ICD-10-CM | POA: Diagnosis not present

## 2021-11-25 DIAGNOSIS — I10 Essential (primary) hypertension: Secondary | ICD-10-CM

## 2021-11-25 DIAGNOSIS — T466X5D Adverse effect of antihyperlipidemic and antiarteriosclerotic drugs, subsequent encounter: Secondary | ICD-10-CM

## 2021-11-25 DIAGNOSIS — E785 Hyperlipidemia, unspecified: Secondary | ICD-10-CM

## 2021-11-25 DIAGNOSIS — I2510 Atherosclerotic heart disease of native coronary artery without angina pectoris: Secondary | ICD-10-CM

## 2021-11-25 DIAGNOSIS — E1169 Type 2 diabetes mellitus with other specified complication: Secondary | ICD-10-CM

## 2021-11-25 MED ORDER — PRALUENT 75 MG/ML ~~LOC~~ SOAJ: 1.0000 | SUBCUTANEOUS | 11 refills | Status: DC

## 2021-11-25 NOTE — Telephone Encounter (Signed)
Dr. Saunders Revel communicated with Dr. Debara Pickett about LDL in negative range. Dr. Debara Pickett ordered change in Praluent from 150mg /mL to 75mg /mL Rx(s) sent to pharmacy electronically. Patient updated via MyChart

## 2021-11-25 NOTE — Progress Notes (Signed)
Follow-up Outpatient Visit Date: 11/25/2021  Primary Care Provider: Cletis Athens, MD [address] [place] [address]  Chief Complaint: Follow-up coronary artery disease  HPI:  Donna Harper is a 70 y.o. female with history of CAD status post PCI to the RCA in setting of NSTEMI (07/2019), HTN, HLD, DM2, TIA, and GERD, who presents for follow-up of coronary artery disease and hyperlipidemia.  I last saw her in June, at which time she was feeling well.  We agreed to start Vascepa in an effort to improve her triglycerides.  However, she did not tolerate this medication and was subsequently switched to fenofibrate.  Labs earlier this week showed significant improvement in triglycerides from 313->151.  LDL was essentially undetectable.  Hemoglobin A1c was stable at 6.0.  Today, Donna Harper reports that she has been feeling fairly well.  She denies chest pain, shortness of breath, palpitations, lightheadedness, and edema.  She is tolerating her medications fairly well though she has noticed some joint pains in her shoulders since starting fenofibrate.  It is not reminiscent of the myalgias that she had in the past with statins.  She walks her dog a lot.  She has also been very vigilant about what she eats, trying to limit red meat and carbohydrates.  --------------------------------------------------------------------------------------------------  Past Medical History:  Diagnosis Date   CAD (coronary artery disease)    a. 07/2019 NSTEMI/PCI: LM 15d, LAD 40p, 50/69m, RI nl, LCX nl, OM1 small, RCA large, 30p, 26m (3.0x12 Resolute Onyx DES), RPDA fills via L->R collats (OM1), EF 45-50%; b. 11/2020 MV: EF>65%, no ischemia/infact-->low risk.   GERD (gastroesophageal reflux disease)    Hyperlipidemia LDL goal <70    Hypertension    Ischemic cardiomyopathy    a. 07/30/2019 LV gram: EF 45-50%, basal inf HK; b. 07/31/2019 Echo: EF 55-60%, impaired relaxation, sev basal inf HK. Nl RV fxn.    Joint pain     Murmur    a. 07/2019 Echo: no significant valvular abnormalities. Mild AoV thickening.   TIA (transient ischemic attack) 08/2014   Type II diabetes mellitus ([facility])    Past Surgical History:  Procedure Laterality Date   BREAST BIOPSY Bilateral 1988   benign/Dr [place]   CORONARY STENT INTERVENTION N/A 07/30/2019   Procedure: CORONARY STENT INTERVENTION;  Surgeon: Nelva Bush, MD;  Location: [facility];  Service: Cardiovascular;  Laterality: N/A;  RCA   LEFT HEART CATH AND CORONARY ANGIOGRAPHY N/A 07/30/2019   Procedure: LEFT HEART CATH AND CORONARY ANGIOGRAPHY;  Surgeon: Nelva Bush, MD;  Location: [facility];  Service: Cardiovascular;  Laterality: N/A;    Current Meds  Medication Sig   Alirocumab (PRALUENT) 150 MG/ML SOAJ INJECT 150 MG INTO THE SKIN EVERY 14 DAYS   aspirin 81 MG tablet Take 81 mg by mouth daily.   cholecalciferol (VITAMIN D) 1000 UNITS tablet Take 1,000 Units by mouth daily.   clobetasol cream (TEMOVATE) 0.30 % Apply 1 application topically 2 (two) times daily.   doxycycline (VIBRA-TABS) 100 MG tablet Take 1 tablet (100 mg total) by mouth 2 (two) times daily.   fenofibrate 54 MG tablet Take 1 tablet (54 mg total) by mouth daily.   glipiZIDE (GLUCOTROL XL) 5 MG 24 hr tablet Take 1 tablet (5 mg total) by mouth daily.   losartan (COZAAR) 100 MG tablet TAKE 1 TABLET BY MOUTH DAILY   metFORMIN (GLUCOPHAGE) 1000 MG tablet Take 1 tablet (1,000  mg total) by mouth 2 (two) times daily.   metoprolol tartrate (LOPRESSOR) 50 MG tablet TAKE 1 TABLET(50 MG) BY MOUTH TWICE DAILY   Multiple Vitamin (MULTIVITAMIN WITH MINERALS) TABS tablet Take 1 tablet by mouth daily.   NASONEX 50 MCG/ACT nasal spray Place 2 sprays into the nose daily.    nitroGLYCERIN (NITROSTAT) 0.4 MG SL tablet Place 1 tablet (0.4 mg total) under the tongue every 5 (five) minutes as needed for chest pain.   sitaGLIPtin (JANUVIA) 50 MG  tablet Take 1 tablet (50 mg total) by mouth daily.   vitamin E 400 UNIT capsule Take 400 Units by mouth daily.    Allergies: Atorvastatin, Erythromycin, Levaquin [levofloxacin in d5w], Penicillins, Rosuvastatin, Simvastatin, Tequin [gatifloxacin], Zoloft [sertraline hcl], and Zithromax [azithromycin]  Social History   Tobacco Use   Smoking status: Former   Smokeless tobacco: Never   Tobacco comments:    Quit 30 years ago  Vaping Use   Vaping Use: Never used  Substance Use Topics   Alcohol use: No    Alcohol/week: 0.0 standard drinks   Drug use: No    Family History  Problem Relation Age of Onset   COPD Mother    COPD Father     Review of Systems: A 12-system review of systems was performed and was negative except as noted in the HPI.  --------------------------------------------------------------------------------------------------  Physical Exam: BP 116/70 (BP Location: Left Arm, Patient Position: Sitting, Cuff Size: Normal)    Pulse 63    Ht 5\' 3"  (1.6 m)    Wt 143 lb (64.9 kg)    SpO2 96%    BMI 25.33 kg/m   General:  NAD. Neck: No JVD or HJR. Lungs: Clear to auscultation bilaterally without wheezes or crackles. Heart: Regular rate and rhythm without murmurs, rubs, or gallops. Abdomen: Soft, nontender, nondistended. Extremities: No lower extremity edema.  EKG: Normal sinus rhythm with right bundle branch block.  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  Lab Results  Component Value Date   WBC 5.2 05/12/2020   HGB 12.1 05/12/2020   HCT 36.3 05/12/2020   MCV 89.2 05/12/2020   PLT 174 05/12/2020    Lab Results  Component Value Date   NA 140 05/12/2020   K 5.0 05/12/2020   CL 108 05/12/2020   CO2 19 (L) 05/12/2020   BUN 19 05/12/2020   CREATININE 0.88 05/12/2020   GLUCOSE 149 (H) 05/12/2020   ALT 16 11/22/2021    Lab Results  Component Value Date   CHOL 87 11/22/2021   HDL 60 11/22/2021   LDLCALC NEG 3 11/22/2021   LDLDIRECT 52.4 11/02/2020   TRIG 151 (H)  11/22/2021   CHOLHDL 1.5 11/22/2021    --------------------------------------------------------------------------------------------------  ASSESSMENT AND PLAN: Coronary artery disease: Donna Harper is doing well without angina.  EKG today shows new RBBB but no acute ischemic changes.  Given absence of symptoms and reassuring stress test a year ago without evidence of ischemia, we will plan to continue her current medications for secondary prevention.  Hyperlipidemia associated with type 2 diabetes mellitus complicated by drug induced myopathy: Donna Harper is tolerating fenofibrate and Praluent fairly well.  She did not tolerate Vascepa.  Her lipid panel earlier this week showed significant improvement in her triglycerides, just above goal now at 151.  Her calculated LDL is -3.  I will reach out to Dr. Debara Pickett as well as our pharmacy team to discuss ongoing lipid therapy.  Hypertension: Blood pressure well controlled today.  No medication changes at  this time.  Follow-up: Return to clinic in 6 months.  Nelva Bush, MD 11/25/2021 12:59 PM

## 2021-11-25 NOTE — Patient Instructions (Signed)
Medication Instructions:   Your physician recommends that you continue on your current medications as directed. Please refer to the Current Medication list given to you today.  *If you need a refill on your cardiac medications before your next appointment, please call your pharmacy*   Lab Work:  None ordered  Testing/Procedures:  None ordered    Follow-Up: At [facility], you and your health needs are our priority.  As part of our continuing mission to provide you with exceptional heart care, we have created designated Provider Care Teams.  These Care Teams include your primary Cardiologist (physician) and Advanced Practice Providers (APPs -  Physician Assistants and Nurse Practitioners) who all work together to provide you with the care you need, when you need it.  We recommend signing up for the patient portal called "MyChart".  Sign up information is provided on this After Visit Summary.  MyChart is used to connect with patients for Virtual Visits (Telemedicine).  Patients are able to view lab/test results, encounter notes, upcoming appointments, etc.  Non-urgent messages can be sent to your provider as well.   To learn more about what you can do with MyChart, go to [email or web address].    Your next appointment:   6 month(s)  The format for your next appointment:   In Person  Provider:   You may see Nelva Bush, MD or one of the following Advanced Practice Providers on your designated Care Team:   Murray Hodgkins, NP Christell Faith, PA-C Cadence Kathlen Mody, [place]

## 2021-12-09 ENCOUNTER — Telehealth: Payer: Self-pay | Admitting: Internal Medicine

## 2021-12-09 DIAGNOSIS — E785 Hyperlipidemia, unspecified: Secondary | ICD-10-CM

## 2021-12-09 DIAGNOSIS — Z79899 Other long term (current) drug therapy: Secondary | ICD-10-CM

## 2021-12-09 NOTE — Telephone Encounter (Signed)
Was able to return call to Mrs. Meiser in regards to [employer]. Pt reports she has been expecting a return call from Dr. Saunders Revel regarding lowering her dose of Praluent, but was not sure if Dr. Saunders Revel had spoken with Dr. Debara Pickett.  Advised that Dr. Lysbeth Penner office sent her a MyChart message 12/15 regarding Praluent, pt reports she doe snot use her MyChart. Was able to relay the message  Hello Marylynn -    Dr. Saunders Revel reached out to Dr. Debara Pickett about your recent lab results. It looks like your LDL is less than 0 - very low. Dr. Debara Pickett is decreasing your Praluent from 150mg /mL to 75mg /mL. I have sent this prescription to the pharmacy.    He would advise a repeat fasting lipid panel in about 3-4 months and a follow up with him after -- this can be a video visit or in-person.    I can assist in setting up your visit -- please let me know your preference.    Thanks,  Clarnce Flock., RN  Advised it looks like the reduce Praluent of 75 mg was sent in to [facility] and should be ready for pick up as it was sent in on 12/15. Mrs. Hutner very thankful for the information, she would like to call Dr. Lysbeth Penner office for confirmation, number to office given, she will also advise their office that she needs phone calls as she does not use MyChart.  Otherwise all questions were address and no additional concerns at this time. Mrs. Moltz thankful for the return call and advice. Agreeable to plan, will call back for anything further.

## 2021-12-09 NOTE — Telephone Encounter (Signed)
Patient is calling to follow up on her conversation with Dr. Saunders Revel about discussing with Dr. Debara Pickett regardingPraluent. Please call to discuss.

## 2021-12-09 NOTE — Addendum Note (Signed)
Addended by: Mila Merry on: 12/09/2021 03:00 PM   Modules accepted: Orders

## 2021-12-09 NOTE — Addendum Note (Signed)
Addended by: Wynema Birch on: 12/09/2021 03:04 PM   Modules accepted: Orders

## 2021-12-09 NOTE — Telephone Encounter (Signed)
Spoke with patient about PCSK9i dose change. Explained that with LDL under 0 Dr. Debara Pickett decreased dose, same frequency. Scheduled visit on 03/15/22 @ 0915 for virtual visit. Advised to have labs done about 1 week prior - she prefers [facility] lab for this.

## 2021-12-31 ENCOUNTER — Other Ambulatory Visit: Payer: Self-pay | Admitting: Internal Medicine

## 2022-01-05 ENCOUNTER — Other Ambulatory Visit: Payer: Self-pay

## 2022-01-05 DIAGNOSIS — Z1211 Encounter for screening for malignant neoplasm of colon: Secondary | ICD-10-CM

## 2022-01-05 DIAGNOSIS — Z86010 Personal history of colonic polyps: Secondary | ICD-10-CM

## 2022-01-05 DIAGNOSIS — Z1231 Encounter for screening mammogram for malignant neoplasm of breast: Secondary | ICD-10-CM

## 2022-01-05 MED ORDER — NA SULFATE-K SULFATE-MG SULF 17.5-3.13-1.6 GM/177ML PO SOLN: 1.0000 | Freq: Once | ORAL | 0 refills | Status: AC

## 2022-01-05 NOTE — Progress Notes (Signed)
Gastroenterology Pre-Procedure Review  Request Date: 02/01/2022 Requesting Physician: Dr. Allen Norris   PATIENT REVIEW QUESTIONS: The patient responded to the following health history questions as indicated:    1. Are you having any GI issues? no 2. Do you have a personal history of Polyps? yes (yes last colonoscopy ) 3. Do you have a family history of Colon Cancer or Polyps? yes (both) 4. Diabetes Mellitus? yes (type 2) 5. Joint replacements in the past 12 months?no 6. Major health problems in the past 3 months?no 7. Any artificial heart valves, MVP, or defibrillator?has stint    MEDICATIONS & ALLERGIES:    Patient reports the following regarding taking any anticoagulation/antiplatelet therapy:   Plavix, Coumadin, Eliquis, Xarelto, Lovenox, Pradaxa, Brilinta, or Effient? no Aspirin? yes (81 mg)  Patient confirms/reports the following medications:  Current Outpatient Medications  Medication Sig Dispense Refill   Alirocumab (PRALUENT) 75 MG/ML SOAJ Inject 1 Dose into the skin every 14 (fourteen) days. 2 mL 11   aspirin 81 MG tablet Take 81 mg by mouth daily.     cholecalciferol (VITAMIN D) 1000 UNITS tablet Take 1,000 Units by mouth daily.     clobetasol cream (TEMOVATE) 7.20 % Apply 1 application topically 2 (two) times daily. 45 g 4   doxycycline (VIBRA-TABS) 100 MG tablet Take 1 tablet (100 mg total) by mouth 2 (two) times daily. 20 tablet 0   fenofibrate 54 MG tablet Take 1 tablet (54 mg total) by mouth daily. 90 tablet 3   glipiZIDE (GLUCOTROL XL) 5 MG 24 hr tablet Take 1 tablet (5 mg total) by mouth daily. 90 tablet 3   losartan (COZAAR) 100 MG tablet TAKE 1 TABLET BY MOUTH DAILY 90 tablet 1   metFORMIN (GLUCOPHAGE) 1000 MG tablet Take 1 tablet (1,000 mg total) by mouth 2 (two) times daily. 180 tablet 3   metoprolol tartrate (LOPRESSOR) 50 MG tablet TAKE 1 TABLET(50 MG) BY MOUTH TWICE DAILY 60 tablet 4   Multiple Vitamin (MULTIVITAMIN WITH MINERALS) TABS tablet Take 1 tablet by mouth  daily.     NASONEX 50 MCG/ACT nasal spray Place 2 sprays into the nose daily.   0   nitroGLYCERIN (NITROSTAT) 0.4 MG SL tablet Place 1 tablet (0.4 mg total) under the tongue every 5 (five) minutes as needed for chest pain. 90 tablet 3   sitaGLIPtin (JANUVIA) 50 MG tablet Take 1 tablet (50 mg total) by mouth daily. 30 tablet 6   vitamin E 400 UNIT capsule Take 400 Units by mouth daily.     No current facility-administered medications for this visit.    Patient confirms/reports the following allergies:  Allergies  Allergen Reactions   Atorvastatin     Myalgias   Erythromycin     Stomach Problems'    Levaquin [Levofloxacin In D5w]    Penicillins     Heart races   Rosuvastatin     Myalgias   Simvastatin     Myalgias   Tequin [Gatifloxacin]    Zoloft [Sertraline Hcl]    Zithromax [Azithromycin] Palpitations    Heart race    No orders of the defined types were placed in this encounter.   AUTHORIZATION INFORMATION Primary Insurance: 1D#: Group #:  Secondary Insurance: 1D#: Group #:  SCHEDULE INFORMATION: Date: 02/01/2022 Time: Location:[facility]

## 2022-01-24 ENCOUNTER — Other Ambulatory Visit: Payer: Self-pay | Admitting: *Deleted

## 2022-01-24 DIAGNOSIS — J069 Acute upper respiratory infection, unspecified: Secondary | ICD-10-CM

## 2022-01-24 MED ORDER — DOXYCYCLINE HYCLATE 100 MG PO TABS: 100.0000 mg | ORAL_TABLET | Freq: Two times a day (BID) | ORAL | 0 refills | Status: DC

## 2022-01-28 ENCOUNTER — Other Ambulatory Visit: Payer: Self-pay | Admitting: Internal Medicine

## 2022-01-31 ENCOUNTER — Encounter: Payer: Self-pay | Admitting: Gastroenterology

## 2022-02-01 ENCOUNTER — Ambulatory Visit: Payer: Medicare HMO | Admitting: Anesthesiology

## 2022-02-01 ENCOUNTER — Encounter
Admission: RE | Disposition: A | Discharge status: Home / Self Care | Payer: Self-pay | Source: Home / Self Care | Attending: Gastroenterology

## 2022-02-01 ENCOUNTER — Ambulatory Visit
Admission: RE | Admit: 2022-02-01 | Discharge: 2022-02-01 | Disposition: A | Discharge status: Home / Self Care | Payer: Medicare HMO | Attending: Gastroenterology | Admitting: Gastroenterology

## 2022-02-01 ENCOUNTER — Encounter: Payer: Self-pay | Admitting: Gastroenterology

## 2022-02-01 DIAGNOSIS — D125 Benign neoplasm of sigmoid colon: Secondary | ICD-10-CM | POA: Diagnosis not present

## 2022-02-01 DIAGNOSIS — D124 Benign neoplasm of descending colon: Secondary | ICD-10-CM | POA: Insufficient documentation

## 2022-02-01 DIAGNOSIS — K635 Polyp of colon: Secondary | ICD-10-CM | POA: Diagnosis not present

## 2022-02-01 DIAGNOSIS — Z1211 Encounter for screening for malignant neoplasm of colon: Secondary | ICD-10-CM | POA: Insufficient documentation

## 2022-02-01 DIAGNOSIS — Z860100 Personal history of colon polyps, unspecified: Secondary | ICD-10-CM

## 2022-02-01 DIAGNOSIS — K648 Other hemorrhoids: Secondary | ICD-10-CM | POA: Insufficient documentation

## 2022-02-01 DIAGNOSIS — E782 Mixed hyperlipidemia: Secondary | ICD-10-CM | POA: Diagnosis not present

## 2022-02-01 HISTORY — PX: COLONOSCOPY WITH PROPOFOL: SHX5780

## 2022-02-01 SURGERY — COLONOSCOPY WITH PROPOFOL
Anesthesia: General

## 2022-02-01 MED ORDER — PROPOFOL 500 MG/50ML IV EMUL
INTRAVENOUS | Status: AC
  Filled 2022-02-01: qty 50

## 2022-02-01 MED ORDER — SODIUM CHLORIDE 0.9 % IV SOLN: INTRAVENOUS | Status: DC

## 2022-02-01 MED ORDER — PROPOFOL 10 MG/ML IV BOLUS
INTRAVENOUS | Status: DC | PRN
  Administered 2022-02-01: 30 mg via INTRAVENOUS

## 2022-02-01 MED ORDER — PROPOFOL 500 MG/50ML IV EMUL
INTRAVENOUS | Status: DC | PRN
  Administered 2022-02-01: 200 ug/kg/min via INTRAVENOUS

## 2022-02-01 MED ORDER — PHENYLEPHRINE HCL (PRESSORS) 10 MG/ML IV SOLN
INTRAVENOUS | Status: AC
  Filled 2022-02-01: qty 1

## 2022-02-01 NOTE — Transfer of Care (Signed)
Immediate Anesthesia Transfer of Care Note  Patient: Donna Harper  Procedure(s) Performed: COLONOSCOPY WITH PROPOFOL  Patient Location: PACU  Anesthesia Type:General  Level of Consciousness: awake, alert  and oriented  Airway & Oxygen Therapy: Patient Spontanous Breathing and Patient connected to nasal cannula oxygen  Post-op Assessment: Report given to RN and Post -op Vital signs reviewed and stable  Post vital signs: Reviewed and stable  Last Vitals:  Vitals Value Taken Time  BP 125/65 02/01/22 0758  Temp 36.1 C 02/01/22 0757  Pulse 74 02/01/22 0758  Resp 19 02/01/22 0758  SpO2 99 % 02/01/22 0758  Vitals shown include unvalidated device data.  Last Pain:  Vitals:   02/01/22 0757  TempSrc: Temporal  PainSc: (P) 0-No pain         Complications: No notable events documented.

## 2022-02-01 NOTE — H&P (Signed)
Lucilla Lame, MD [address] [place], [place] [address] Phone:[phone] Fax : [phone]  Primary Care Physician:  Cletis Athens, MD Primary Gastroenterologist:  Dr. Allen Norris  Pre-Procedure History & Physical: HPI:  Donna Harper is a 71 y.o. female is here for an colonoscopy.   Past Medical History:  Diagnosis Date   CAD (coronary artery disease)    a. 07/2019 NSTEMI/PCI: LM 15d, LAD 40p, 50/1m, RI nl, LCX nl, OM1 small, RCA large, 30p, 30m (3.0x12 Resolute Onyx DES), RPDA fills via L->R collats (OM1), EF 45-50%; b. 11/2020 MV: EF>65%, no ischemia/infact-->low risk.   GERD (gastroesophageal reflux disease)    Hyperlipidemia LDL goal <70    Hypertension    Ischemic cardiomyopathy    a. 07/30/2019 LV gram: EF 45-50%, basal inf HK; b. 07/31/2019 Echo: EF 55-60%, impaired relaxation, sev basal inf HK. Nl RV fxn.    Joint pain    Murmur    a. 07/2019 Echo: no significant valvular abnormalities. Mild AoV thickening.   Stroke [facility])    TIA (transient ischemic attack) 08/2014   Type II diabetes mellitus ([place])     Past Surgical History:  Procedure Laterality Date   BREAST BIOPSY Bilateral 1988   benign/Dr [place]   CORONARY STENT INTERVENTION N/A 07/30/2019   Procedure: CORONARY STENT INTERVENTION;  Surgeon: Nelva Bush, MD;  Location: [place] [facility];  Service: Cardiovascular;  Laterality: N/A;  RCA   LEFT HEART CATH AND CORONARY ANGIOGRAPHY N/A 07/30/2019   Procedure: LEFT HEART CATH AND CORONARY ANGIOGRAPHY;  Surgeon: Nelva Bush, MD;  Location: [place] [facility];  Service: Cardiovascular;  Laterality: N/A;    Prior to Admission medications   Medication Sig Start Date End Date Taking? Authorizing Provider  aspirin 81 MG tablet Take 81 mg by mouth daily.   Yes [provider]  cholecalciferol (VITAMIN D) 1000 UNITS tablet Take 1,000 Units by mouth daily.   Yes [provider]   fenofibrate 54 MG tablet Take 1 tablet (54 mg total) by mouth daily. 06/17/21  Yes End, Harrell Gave, MD  glipiZIDE (GLUCOTROL XL) 5 MG 24 hr tablet Take 1 tablet (5 mg total) by mouth daily. 10/18/21  Yes Masoud, Viann Shove, MD  losartan (COZAAR) 100 MG tablet TAKE 1 TABLET BY MOUTH DAILY 01/03/22  Yes Masoud, Viann Shove, MD  metFORMIN (GLUCOPHAGE) 1000 MG tablet TAKE 1 TABLET(1000 MG) BY MOUTH TWICE DAILY 01/31/22  Yes Masoud, Viann Shove, MD  metoprolol tartrate (LOPRESSOR) 50 MG tablet TAKE 1 TABLET(50 MG) BY MOUTH TWICE DAILY 01/03/22  Yes End, Harrell Gave, MD  Multiple Vitamin (MULTIVITAMIN WITH MINERALS) TABS tablet Take 1 tablet by mouth daily.   Yes [provider]  vitamin E 400 UNIT capsule Take 400 Units by mouth daily.   Yes [provider]  Alirocumab (PRALUENT) 75 MG/ML SOAJ Inject 1 Dose into the skin every 14 (fourteen) days. 11/25/21   Hilty, Nadean Corwin, MD  clobetasol cream (TEMOVATE) 2.45 % Apply 1 application topically 2 (two) times daily. 11/01/21   Cletis Athens, MD  doxycycline (VIBRA-TABS) 100 MG tablet Take 1 tablet (100 mg total) by mouth 2 (two) times daily. 01/24/22   Cletis Athens, MD  NASONEX 50 MCG/ACT nasal spray Place 2 sprays into the nose daily.  10/29/14   [provider]  nitroGLYCERIN (NITROSTAT) 0.4 MG SL tablet Place 1 tablet (0.4 mg total) under the tongue every 5 (five) minutes as needed for  chest pain. 08/14/19 04/29/29  Theora Gianotti, NP  sitaGLIPtin (JANUVIA) 50 MG tablet Take 1 tablet (50 mg total) by mouth daily. 05/12/20   Cletis Athens, MD    Allergies as of 01/05/2022 - Review Complete 01/05/2022  Allergen Reaction Noted   Atorvastatin  08/14/2019   Erythromycin  11/04/2014   Levaquin [levofloxacin in d5w]  11/04/2014   Penicillins  11/04/2014   Rosuvastatin  08/14/2019   Simvastatin  08/14/2019   Tequin [gatifloxacin]  11/04/2014   Zoloft [sertraline hcl]  11/04/2014   Zithromax [azithromycin] Palpitations 11/04/2014    Family  History  Problem Relation Age of Onset   COPD Mother    COPD Father     Social History   Socioeconomic History   Marital status: Married    Spouse name: Elonna Mcfarlane   Number of children: 3   Years of education: [place] education level: Some college, no degree  Occupational History   Not on file  Tobacco Use   Smoking status: Former   Smokeless tobacco: Never   Tobacco comments:    Quit 30 years ago  Vaping Use   Vaping Use: Never used  Substance and Sexual Activity   Alcohol use: No    Alcohol/week: 0.0 standard drinks   Drug use: No   Sexual activity: Yes  Other Topics Concern   Not on file  Social History Narrative   Not on file   Social Determinants of Health   Financial Resource Strain: Low Risk    Difficulty of Paying Living Expenses: Not very hard  Food Insecurity: Not on file  Transportation Needs: No Transportation Needs   Lack of Transportation (Medical): No   Lack of Transportation (Non-Medical): No  Physical Activity: Insufficiently Active   Days of Exercise per Week: 7 days   Minutes of Exercise per Session: 20 min  Stress: No Stress Concern Present   Feeling of Stress : Not at all  Social Connections: Socially Integrated   Frequency of Communication with Friends and Family: More than three times a week   Frequency of Social Gatherings with Friends and Family: More than three times a week   Attends Religious Services: More than 4 times per year   Active Member of [employer] or Organizations: Yes   Attends Music therapist: More than 4 times per year   Marital Status: Married  Human resources officer Violence: Not At Risk   Fear of Current or Ex-Partner: No   Emotionally Abused: No   Physically Abused: No   Sexually Abused: No    Review of Systems: See HPI, otherwise negative ROS  Physical Exam: BP (!) 165/75    Pulse 67    Temp (!) 97.5 F (36.4 C) (Temporal)    Resp 16    Ht 5\' 3"  (1.6 m)    Wt 64.4 kg    SpO2 99%     BMI 25.15 kg/m  General:   Alert,  pleasant and cooperative in NAD Head:  Normocephalic and atraumatic. Neck:  Supple; no masses or thyromegaly. Lungs:  Clear throughout to auscultation.    Heart:  Regular rate and rhythm. Abdomen:  Soft, nontender and nondistended. Normal bowel sounds, without guarding, and without rebound.   Neurologic:  Alert and  oriented x4;  grossly normal neurologically.  Impression/Plan: Donna Harper is here for an colonoscopy to be performed for a history of adenomatous polyps on 2012   Risks, benefits, limitations, and alternatives regarding  colonoscopy have  been reviewed with the patient.  Questions have been answered.  All parties agreeable.   Lucilla Lame, MD  02/01/2022, 7:26 AM

## 2022-02-01 NOTE — Anesthesia Preprocedure Evaluation (Signed)
Anesthesia Evaluation  Patient identified by MRN, date of birth, ID band Patient awake    Reviewed: Allergy & Precautions, NPO status , Patient's Chart, lab work & pertinent test results  History of Anesthesia Complications Negative for: history of anesthetic complications  Airway Mallampati: II  TM Distance: >3 FB Neck ROM: Full    Dental no notable dental hx. (+) Teeth Intact   Pulmonary neg sleep apnea, neg COPD, Patient abstained from smoking.Not current smoker, former smoker,    Pulmonary exam normal breath sounds clear to auscultation       Cardiovascular Exercise Tolerance: Good METShypertension, + CAD, + Past MI and + Cardiac Stents  (-) dysrhythmias  Rhythm:Regular Rate:Normal - Systolic murmurs    Neuro/Psych CVA negative psych ROS   GI/Hepatic GERD  ,(+)     (-) substance abuse  ,   Endo/Other  diabetes, Type 2  Renal/GU negative Renal ROS     Musculoskeletal   Abdominal   Peds  Hematology   Anesthesia Other Findings Past Medical History: No date: CAD (coronary artery disease)     Comment:  a. 07/2019 NSTEMI/PCI: LM 15d, LAD 40p, 50/24m, RI nl,               LCX nl, OM1 small, RCA large, 30p, 56m (3.0x12 Resolute               Onyx DES), RPDA fills via L->R collats (OM1), EF 45-50%;               b. 11/2020 MV: EF>65%, no ischemia/infact-->low risk. No date: GERD (gastroesophageal reflux disease) No date: Hyperlipidemia LDL goal <70 No date: Hypertension No date: Ischemic cardiomyopathy     Comment:  a. 07/30/2019 LV gram: EF 45-50%, basal inf HK; b.               07/31/2019 Echo: EF 55-60%, impaired relaxation, sev basal              inf HK. Nl RV fxn.  No date: Joint pain No date: Murmur     Comment:  a. 07/2019 Echo: no significant valvular abnormalities.               Mild AoV thickening. No date: Stroke [facility]) 08/2014: TIA (transient ischemic attack) No date: Type II diabetes mellitus ([facility])   Reproductive/Obstetrics                             Anesthesia Physical Anesthesia Plan  ASA: 3  Anesthesia Plan: General   Post-op Pain Management: Minimal or no pain anticipated   Induction: Intravenous  PONV Risk Score and Plan: 3 and Propofol infusion, TIVA and Ondansetron  Airway Management Planned: Nasal Cannula  Additional Equipment: None  Intra-op Plan:   Post-operative Plan:   Informed Consent: I have reviewed the patients History and Physical, chart, labs and discussed the procedure including the risks, benefits and alternatives for the proposed anesthesia with the patient or authorized representative who has indicated his/her understanding and acceptance.     Dental advisory given  Plan Discussed with: CRNA and Surgeon  Anesthesia Plan Comments: (Discussed risks of anesthesia with patient, including possibility of difficulty with spontaneous ventilation under anesthesia necessitating airway intervention, PONV, and rare risks such as cardiac or respiratory or neurological events, and allergic reactions. Discussed the role of CRNA in patient's perioperative care. Patient understands.)        Anesthesia Quick Evaluation

## 2022-02-01 NOTE — Anesthesia Procedure Notes (Signed)
Date/Time: 02/01/2022 7:42 AM Performed by: Nelda Marseille, CRNA Pre-anesthesia Checklist: Patient identified, Emergency Drugs available, Suction available, Patient being monitored and Timeout performed Oxygen Delivery Method: Nasal cannula

## 2022-02-01 NOTE — Op Note (Signed)
[facility] Gastroenterology Patient Name: Donna Harper Procedure Date: 02/01/2022 7:23 AM MRN: [id number] Account #: [id number] Date of Birth: 1951-04-07 Admit Type: Outpatient Age: 71 Room: [facility] ENDO ROOM 4 Gender: Female Note Status: Finalized Instrument Name: Prentice Docker [id number] Procedure:             Colonoscopy Indications:           High risk colon cancer surveillance: Personal history                         of colonic polyps Providers:             Midge Minium MD, MD Medicines:             Propofol per Anesthesia Complications:         No immediate complications. Procedure:             Pre-Anesthesia Assessment:                        - Prior to the procedure, a History and Physical was                         performed, and patient medications and allergies were                         reviewed. The patient's tolerance of previous                         anesthesia was also reviewed. The risks and benefits                         of the procedure and the sedation options and risks                         were discussed with the patient. All questions were                         answered, and informed consent was obtained. Prior                         Anticoagulants: The patient has taken no previous                         anticoagulant or antiplatelet agents. ASA Grade                         Assessment: II - A patient with mild systemic disease.                         After reviewing the risks and benefits, the patient                         was deemed in satisfactory condition to undergo the                         procedure.                        After obtaining informed consent, the colonoscope was  passed under direct vision. Throughout the procedure,                         the patient's blood pressure, pulse, and oxygen                         saturations were monitored continuously. The                          Colonoscope was introduced through the anus and                         advanced to the the cecum, identified by appendiceal                         orifice and ileocecal valve. The colonoscopy was                         performed without difficulty. The patient tolerated                         the procedure well. The quality of the bowel                         preparation was excellent. Findings:      The perianal and digital rectal examinations were normal.      A 8 mm polyp was found in the descending colon. The polyp was sessile.       The polyp was removed with a cold snare. Resection and retrieval were       complete.      A 5 mm polyp was found in the sigmoid colon. The polyp was sessile. The       polyp was removed with a cold snare. Resection and retrieval were       complete.      Multiple small-mouthed diverticula were found in the sigmoid colon and       descending colon.      Non-bleeding internal hemorrhoids were found during retroflexion. The       hemorrhoids were Grade II (internal hemorrhoids that prolapse but reduce       spontaneously). Impression:            - One 8 mm polyp in the descending colon, removed with                         a cold snare. Resected and retrieved.                        - One 5 mm polyp in the sigmoid colon, removed with a                         cold snare. Resected and retrieved.                        - Diverticulosis in the sigmoid colon and in the                         descending colon.                        -  Non-bleeding internal hemorrhoids. Recommendation:        - Discharge patient to home.                        - Resume previous diet.                        - Continue present medications.                        - Await pathology results.                        - Repeat colonoscopy is not recommended for                         surveillance. Procedure Code(s):     --- Professional ---                        [phone],  Colonoscopy, flexible; with removal of                         tumor(s), polyp(s), or other lesion(s) by snare                         technique Diagnosis Code(s):     --- Professional ---                        Z86.010, Personal history of colonic polyps                        K63.5, Polyp of colon CPT copyright 2019 American Medical Association. All rights reserved. The codes documented in this report are preliminary and upon coder review may  be revised to meet current compliance requirements. Midge Minium MD, MD 02/01/2022 7:55:14 AM This report has been signed electronically. Number of Addenda: 0 Note Initiated On: 02/01/2022 7:23 AM Scope Withdrawal Time: 0 hours 9 minutes 8 seconds  Total Procedure Duration: 0 hours 13 minutes 34 seconds  Estimated Blood Loss:  Estimated blood loss: none.      [facility]

## 2022-02-01 NOTE — Anesthesia Postprocedure Evaluation (Signed)
Anesthesia Post Note  Patient: Donna Harper  Procedure(s) Performed: COLONOSCOPY WITH PROPOFOL  Patient location during evaluation: Endoscopy Anesthesia Type: General Level of consciousness: awake and alert Pain management: pain level controlled Vital Signs Assessment: post-procedure vital signs reviewed and stable Respiratory status: spontaneous breathing, nonlabored ventilation, respiratory function stable and patient connected to nasal cannula oxygen Cardiovascular status: blood pressure returned to baseline and stable Postop Assessment: no apparent nausea or vomiting Anesthetic complications: no   No notable events documented.   Last Vitals:  Vitals:   02/01/22 0807 02/01/22 0817  BP: 126/63 126/78  Pulse: 70 72  Resp: 20   Temp:    SpO2: 98% 98%    Last Pain:  Vitals:   02/01/22 0757  TempSrc: Temporal  PainSc: 0-No pain                 Arita Miss

## 2022-02-02 ENCOUNTER — Encounter: Payer: Self-pay | Admitting: Gastroenterology

## 2022-02-02 LAB — SURGICAL PATHOLOGY

## 2022-02-03 ENCOUNTER — Encounter: Payer: Self-pay | Admitting: Gastroenterology

## 2022-03-11 ENCOUNTER — Other Ambulatory Visit
Admission: RE | Admit: 2022-03-11 | Discharge: 2022-03-11 | Disposition: A | Discharge status: Home / Self Care | Payer: Medicare HMO | Source: Ambulatory Visit | Attending: Internal Medicine | Admitting: Internal Medicine

## 2022-03-11 DIAGNOSIS — E785 Hyperlipidemia, unspecified: Secondary | ICD-10-CM

## 2022-03-11 DIAGNOSIS — Z79899 Other long term (current) drug therapy: Secondary | ICD-10-CM

## 2022-03-11 LAB — LIPID PANEL
Cholesterol: 122 mg/dL (ref 0–200)
HDL: 65 mg/dL (ref >40)
LDL Cholesterol: 29 mg/dL (ref 0–99)
Total CHOL/HDL Ratio: 1.9 RATIO
Triglycerides: 139 mg/dL (ref <150)
VLDL: 28 mg/dL (ref 0–40)

## 2022-03-15 ENCOUNTER — Telehealth (INDEPENDENT_AMBULATORY_CARE_PROVIDER_SITE_OTHER): Payer: Medicare HMO | Admitting: Internal Medicine

## 2022-03-15 ENCOUNTER — Encounter: Payer: Self-pay | Admitting: Internal Medicine

## 2022-03-15 VITALS — Wt 144.0 lb

## 2022-03-15 DIAGNOSIS — T466X5D Adverse effect of antihyperlipidemic and antiarteriosclerotic drugs, subsequent encounter: Secondary | ICD-10-CM | POA: Diagnosis not present

## 2022-03-15 DIAGNOSIS — E785 Hyperlipidemia, unspecified: Secondary | ICD-10-CM

## 2022-03-15 DIAGNOSIS — I2510 Atherosclerotic heart disease of native coronary artery without angina pectoris: Secondary | ICD-10-CM

## 2022-03-15 DIAGNOSIS — G720 Drug-induced myopathy: Secondary | ICD-10-CM

## 2022-03-15 NOTE — Patient Instructions (Signed)
Medication Instructions:  ?Your physician recommends that you continue on your current medications as directed. Please refer to the Current Medication list given to you today. ? ?*If you need a refill on your cardiac medications before your next appointment, please call your pharmacy* ? ? ?Lab Work: ?FASTING lipid panel in 1 year  ? ?If you have labs (blood work) drawn today and your tests are completely normal, you will receive your results only by: ?MyChart Message (if you have MyChart) OR ?A paper copy in the mail ?If you have any lab test that is abnormal or we need to change your treatment, we will call you to review the results. ? ? ?Testing/Procedures: ?NONE ? ? ?Follow-Up: ?At [facility], you and your health needs are our priority.  As part of our continuing mission to provide you with exceptional heart care, we have created designated Provider Care Teams.  These Care Teams include your primary Cardiologist (physician) and Advanced Practice Providers (APPs -  Physician Assistants and Nurse Practitioners) who all work together to provide you with the care you need, when you need it. ? ?We recommend signing up for the patient portal called "MyChart".  Sign up information is provided on this After Visit Summary.  MyChart is used to connect with patients for Virtual Visits (Telemedicine).  Patients are able to view lab/test results, encounter notes, upcoming appointments, etc.  Non-urgent messages can be sent to your provider as well.   ?To learn more about what you can do with MyChart, go to [email or web address].   ? ?Your next appointment:   ?1 year with Dr. Debara Pickett - lipid clinic ?

## 2022-03-15 NOTE — Progress Notes (Signed)
? ?Virtual Visit via Video Note  ? ?This visit type was conducted due to national recommendations for restrictions regarding the COVID-19 Pandemic (e.g. social distancing) in an effort to limit this patient's exposure and mitigate transmission in our community.  Due to her co-morbid illnesses, this patient is at least at moderate risk for complications without adequate follow up.  This format is felt to be most appropriate for this patient at this time.  The patient does have access to video technology.  All issues noted in this document were discussed and addressed.  A limited physical exam could be performed with this format.  Please refer to the patient's chart for her  consent to telehealth for [facility].  ? ?Evaluation Performed: Video visit ? ?Date:  03/15/2022  ? ?ID:  Donna Harper, DOB May 14, 1951, MRN [id number] ? ?Patient Location:  ?[address] ?[name] [address] ? ?Provider location:   ?[address] ?[place], [place] [address] ? ?PCP:  Corky Downs, MD  ?Cardiologist:  [name], MD ?Electrophysiologist:  None  ? ?Chief Complaint: Follow-up dyslipidemia ? ?History of Present Illness:   ? ?Donna Harper is a 71 y.o. female who presents via Web designer for a telehealth visit today.  This is a pleasant 71 year old female kindly referred by Dr. and for management of dyslipidemia.  She was found to have coronary artery disease during recent cardiac catheterization which showed an EF of 45 to 50% by LV gram however was 55 to 60% by echo.  She was found to have multivessel coronary disease and received a stent to the mid RCA.  Aggressive medical therapy was recommended.  Recently labs show total cholesterol 264, triglycerides 372, HDL 47 and LDL 143.  Unfortunately she has a history of statin intolerance causing joint pain and myalgias.  She has been intolerant to atorvastatin, rosuvastatin, pravastatin and others.  She was seen in follow-up yesterday by Christain Sacramento, NP, who recommended starting Repatha.  This was submitted however she was notified by pharmacy that the cost would be about $500/month.  Subsequently it was noted that Praluent is the preferred agent for her insurance and a new prescription was sent in today.  We have discussed options with her if the cost remains elevated including options for patient assistance was we provided her for.  She will need to fill out those applications and submit it.  Cost may also be affected by the insurance plan, deductible, whether she is in the donut hole, etc.  She may wish to consider an alternative healthcare plan during open enrollment this fall.  If she were to be unable to take a PCSK9 inhibitor, there are other options which we can consider and I would like to continue to follow her lipid clinic for this. ? ?03/15/2022 ? ?Donna Harper returns today for follow-up.  She is done well on Praluent.  Her last lipid profile actually showed a calculated negative LDL.  Based on that I advise decreasing her Praluent from 150 to 75 mg daily.  Her repeat lipid profile shows a total cholesterol now 122, triglycerides 139, HDL 65 and LDL 29.  Overall this represents excellent control.  She is tolerating the medicine well. ? ?The patient does not have symptoms concerning for COVID-19 infection (fever, chills, cough, or new SHORTNESS OF BREATH).  ? ? ?Prior CV studies:   ?The following studies were reviewed today: ? ?Chart reviewed ?Lab work ? ?PMHx:  ?Past Medical History:  ?Diagnosis Date  ?  CAD (coronary artery disease)   ? a. 07/2019 NSTEMI/PCI: LM 15d, LAD 40p, 50/3m, RI nl, LCX nl, OM1 small, RCA large, 30p, 42m (3.0x12 Resolute Onyx DES), RPDA fills via L->R collats (OM1), EF 45-50%; b. 11/2020 MV: EF>65%, no ischemia/infact-->low risk.  ? GERD (gastroesophageal reflux disease)   ? Hyperlipidemia LDL goal <70   ? Hypertension   ? Ischemic cardiomyopathy   ? a. 07/30/2019 LV gram: EF 45-50%, basal inf HK; b. 07/31/2019 Echo: EF  55-60%, impaired relaxation, sev basal inf HK. Nl RV fxn.   ? Joint pain   ? Murmur   ? a. 07/2019 Echo: no significant valvular abnormalities. Mild AoV thickening.  ? Stroke [facility])   ? TIA (transient ischemic attack) 08/2014  ? Type II diabetes mellitus (HCC)   ? ? ?Past Surgical History:  ?Procedure Laterality Date  ? BREAST BIOPSY Bilateral 1988  ? benign/Dr Evette Cristal  ? CARDIAC CATHETERIZATION    ? CESAREAN SECTION  1986  ? COLONOSCOPY WITH PROPOFOL N/A 02/01/2022  ? Procedure: COLONOSCOPY WITH PROPOFOL;  Surgeon: Midge Minium, MD;  Location: [facility] ENDOSCOPY;  Service: Endoscopy;  Laterality: N/A;  ? CORONARY STENT INTERVENTION N/A 07/30/2019  ? Procedure: CORONARY STENT INTERVENTION;  Surgeon: [name], MD;  Location: [facility] INVASIVE CV LAB;  Service: Cardiovascular;  Laterality: N/A;  RCA  ? LEFT HEART CATH AND CORONARY ANGIOGRAPHY N/A 07/30/2019  ? Procedure: LEFT HEART CATH AND CORONARY ANGIOGRAPHY;  Surgeon: [name], MD;  Location: [facility] INVASIVE CV LAB;  Service: Cardiovascular;  Laterality: N/A;  ? ? ?FAMHx:  ?Family History  ?Problem Relation Age of Onset  ? COPD Mother   ? COPD Father   ? ? ?SOCHx:  ? reports that she has quit smoking. She has never used smokeless tobacco. She reports that she does not drink alcohol and does not use drugs. ? ?ALLERGIES:  ?Allergies  ?Allergen Reactions  ? Atorvastatin   ?  Myalgias  ? Erythromycin   ?  Stomach ?Problems' ?  ? Levaquin [Levofloxacin In D5w]   ? Penicillins   ?  Heart races  ? Rosuvastatin   ?  Myalgias  ? Simvastatin   ?  Myalgias  ? Tequin [Gatifloxacin]   ? Zoloft [Sertraline Hcl]   ? Zithromax [Azithromycin] Palpitations  ?  Heart race  ? ? ?MEDS: ? ?Current Meds  ?Medication Sig  ? Alirocumab (PRALUENT) 75 MG/ML SOAJ Inject 1 Dose into the skin every 14 (fourteen) days.  ? aspirin 81 MG tablet Take 81 mg by mouth daily.  ? cholecalciferol (VITAMIN D) 1000 UNITS tablet Take 1,000 Units by mouth daily.  ? clobetasol cream (TEMOVATE) 0.05 % Apply 1  application topically 2 (two) times daily.  ? fenofibrate 54 MG tablet Take 1 tablet (54 mg total) by mouth daily.  ? glipiZIDE (GLUCOTROL XL) 5 MG 24 hr tablet Take 1 tablet (5 mg total) by mouth daily.  ? losartan (COZAAR) 100 MG tablet TAKE 1 TABLET BY MOUTH DAILY  ? metFORMIN (GLUCOPHAGE) 1000 MG tablet TAKE 1 TABLET(1000 MG) BY MOUTH TWICE DAILY  ? metoprolol tartrate (LOPRESSOR) 50 MG tablet TAKE 1 TABLET(50 MG) BY MOUTH TWICE DAILY  ? Multiple Vitamin (MULTIVITAMIN WITH MINERALS) TABS tablet Take 1 tablet by mouth daily.  ? NASONEX 50 MCG/ACT nasal spray Place 2 sprays into the nose daily as needed.  ? nitroGLYCERIN (NITROSTAT) 0.4 MG SL tablet Place 1 tablet (0.4 mg total) under the tongue every 5 (five) minutes as needed for chest pain.  ?  sitaGLIPtin (JANUVIA) 50 MG tablet Take 1 tablet (50 mg total) by mouth daily.  ? vitamin E 400 UNIT capsule Take 400 Units by mouth daily.  ?  ? ?ROS: ?Pertinent items noted in HPI and remainder of comprehensive ROS otherwise negative. ? ?Labs/Other Tests and Data Reviewed:   ? ?Recent Labs: ?11/22/2021: ALT 16  ? ?Recent Lipid Panel ?Lab Results  ?Component Value Date/Time  ? CHOL 122 03/11/2022 09:18 AM  ? CHOL 258 (H) 07/23/2014 04:54 AM  ? TRIG 139 03/11/2022 09:18 AM  ? TRIG 640 (H) 07/23/2014 04:54 AM  ? HDL 65 03/11/2022 09:18 AM  ? HDL 33 (L) 07/23/2014 04:54 AM  ? CHOLHDL 1.9 03/11/2022 09:18 AM  ? LDLCALC 29 03/11/2022 09:18 AM  ? LDLCALC SEE COMMENT 07/23/2014 04:54 AM  ? LDLDIRECT 52.4 11/02/2020 09:55 AM  ? ? ?Wt Readings from Last 3 Encounters:  ?03/15/22 144 lb (65.3 kg)  ?02/01/22 142 lb (64.4 kg)  ?11/25/21 143 lb (64.9 kg)  ?  ? ?Exam:   ? ?Vital Signs:  Wt 144 lb (65.3 kg)   BMI 25.51 kg/m?   ? ?General appearance: alert and no distress ?Lungs: No visual respiratory difficulty ?Abdomen: Normal weight ?Extremities: extremities normal, atraumatic, no cyanosis or edema ?Skin: Skin color, texture, turgor normal. No rashes or lesions ?Neurologic: Grossly  normal ? ?ASSESSMENT & PLAN:   ? ?Mixed dyslipidemia, goal LDL less than 70 ?Multivessel coronary artery disease with recent PCI (07/2019) ?Statin intolerance ? ?Ms. Onofrey continues to do well on Praluent.  S

## 2022-04-18 DIAGNOSIS — H93293 Other abnormal auditory perceptions, bilateral: Secondary | ICD-10-CM | POA: Diagnosis not present

## 2022-04-19 ENCOUNTER — Other Ambulatory Visit: Payer: Self-pay

## 2022-04-19 MED ORDER — SITAGLIPTIN PHOSPHATE 50 MG PO TABS: 50.0000 mg | ORAL_TABLET | Freq: Every day | ORAL | 6 refills | Status: DC

## 2022-04-28 ENCOUNTER — Other Ambulatory Visit (HOSPITAL_COMMUNITY): Payer: Self-pay | Admitting: Urology

## 2022-04-28 ENCOUNTER — Other Ambulatory Visit: Payer: Self-pay | Admitting: Urology

## 2022-04-28 DIAGNOSIS — N23 Unspecified renal colic: Secondary | ICD-10-CM | POA: Diagnosis not present

## 2022-04-28 DIAGNOSIS — R300 Dysuria: Secondary | ICD-10-CM | POA: Diagnosis not present

## 2022-04-28 DIAGNOSIS — R310 Gross hematuria: Secondary | ICD-10-CM

## 2022-05-03 ENCOUNTER — Ambulatory Visit
Admission: RE | Admit: 2022-05-03 | Discharge: 2022-05-03 | Disposition: A | Discharge status: Home / Self Care | Payer: Medicare HMO | Source: Ambulatory Visit | Attending: Urology | Admitting: Urology

## 2022-05-03 DIAGNOSIS — K5730 Diverticulosis of large intestine without perforation or abscess without bleeding: Secondary | ICD-10-CM | POA: Diagnosis not present

## 2022-05-03 DIAGNOSIS — R310 Gross hematuria: Secondary | ICD-10-CM | POA: Diagnosis not present

## 2022-05-03 DIAGNOSIS — N200 Calculus of kidney: Secondary | ICD-10-CM | POA: Diagnosis not present

## 2022-05-03 LAB — POCT I-STAT CREATININE: Creatinine, Ser: 1.7 mg/dL — ABNORMAL HIGH (ref 0.44–1.00)

## 2022-05-03 MED ORDER — IOHEXOL 300 MG/ML  SOLN
80.0000 mL | Freq: Once | INTRAMUSCULAR | Status: AC | PRN
  Administered 2022-05-03: 80 mL via INTRAVENOUS

## 2022-05-06 DIAGNOSIS — R310 Gross hematuria: Secondary | ICD-10-CM | POA: Diagnosis not present

## 2022-05-06 DIAGNOSIS — N200 Calculus of kidney: Secondary | ICD-10-CM | POA: Diagnosis not present

## 2022-05-26 ENCOUNTER — Other Ambulatory Visit
Admission: RE | Admit: 2022-05-26 | Discharge: 2022-05-26 | Disposition: A | Discharge status: Home / Self Care | Payer: Medicare HMO | Attending: Internal Medicine | Admitting: Internal Medicine

## 2022-05-26 ENCOUNTER — Ambulatory Visit: Payer: Medicare HMO | Admitting: Internal Medicine

## 2022-05-26 ENCOUNTER — Telehealth: Payer: Self-pay | Admitting: *Deleted

## 2022-05-26 ENCOUNTER — Encounter: Payer: Self-pay | Admitting: Internal Medicine

## 2022-05-26 VITALS — BP 114/60 | HR 75 | Ht 62.5 in | Wt 149.0 lb

## 2022-05-26 DIAGNOSIS — I2510 Atherosclerotic heart disease of native coronary artery without angina pectoris: Secondary | ICD-10-CM

## 2022-05-26 DIAGNOSIS — I10 Essential (primary) hypertension: Secondary | ICD-10-CM

## 2022-05-26 DIAGNOSIS — N179 Acute kidney failure, unspecified: Secondary | ICD-10-CM

## 2022-05-26 DIAGNOSIS — E1169 Type 2 diabetes mellitus with other specified complication: Secondary | ICD-10-CM

## 2022-05-26 DIAGNOSIS — E785 Hyperlipidemia, unspecified: Secondary | ICD-10-CM | POA: Diagnosis not present

## 2022-05-26 LAB — BASIC METABOLIC PANEL
Anion gap: 8 (ref 5–15)
BUN: 28 mg/dL — ABNORMAL HIGH (ref 8–23)
CO2: 25 mmol/L (ref 22–32)
Calcium: 10.3 mg/dL (ref 8.9–10.3)
Chloride: 108 mmol/L (ref 98–111)
Creatinine, Ser: 1.43 mg/dL — ABNORMAL HIGH (ref 0.44–1.00)
GFR, Estimated: 39 mL/min — ABNORMAL LOW (ref >60)
Glucose, Bld: 105 mg/dL — ABNORMAL HIGH (ref 70–99)
Potassium: 4.6 mmol/L (ref 3.5–5.1)
Sodium: 141 mmol/L (ref 135–145)

## 2022-05-26 NOTE — Progress Notes (Unsigned)
Follow-up Outpatient Visit Date: 05/26/2022  Primary Care Provider: Cletis Athens, MD [address] [place] [address]  Chief Complaint: Follow-up /CAD  HPI:  Donna Harper is a 71 y.o. female with history of CAD status post PCI to the RCA in setting of NSTEMI (07/2019), HTN, HLD, DM2, TIA, and GERD, who presents for follow-up of CAD and hyperlipidemia.  I last saw her in 11/2021, at which time she was feeling fairly well, though she complained of some shoulder pain since starting fenofibrate that was reminiscent of her statin associated myalgias.  Due to very low LDL, her Praluent dose was decreased at the direction of Dr. Debara Pickett.  We elected to keep her on current dose of fenofibrate.  She has been doing well without further myalgias.  She notes some pain in the left shoulder, which she attributes to bursitis.  She denies chest pain, shortness of breath, palpitations, lightheadedness, and edema.  She is able to garden without any difficulties.  She had left flank pain last month and was diagnosed with multiple kidney stones.  She was told by her urologist that they should pass without need for lithotripsy.  Of note, creatinine checked at that time in anticipation of CT scan was elevated at 1.7 (baseline ~0.8).  She had been using quite a bit of ibuprofen for pain control at that time.  Renal function has not been rechecked since then.  --------------------------------------------------------------------------------------------------  Past Medical History:  Diagnosis Date   CAD (coronary artery disease)    a. 07/2019 NSTEMI/PCI: LM 15d, LAD 40p, 50/6m RI nl, LCX nl, OM1 small, RCA large, 30p, 958m3.0x12 Resolute Onyx DES), RPDA fills via L->R collats (OM1), EF 45-50%; b. 11/2020 MV: EF>65%, no ischemia/infact-->low risk.   GERD (gastroesophageal reflux disease)    Hyperlipidemia LDL goal <70    Hypertension    Ischemic cardiomyopathy    a. 07/30/2019 LV gram: EF 45-50%, basal inf HK;  b. 07/31/2019 Echo: EF 55-60%, impaired relaxation, sev basal inf HK. Nl RV fxn.    Joint pain    Murmur    a. 07/2019 Echo: no significant valvular abnormalities. Mild AoV thickening.   [facility]   TIA (transient ischemic attack) 08/2014   [place] [facility]   Past Surgical History:  Procedure Laterality Date   BREAST BIOPSY Bilateral 1988   benign/Dr Sankar   CARDIAC CATHETERIZATION     CESAREAN SECTION  1986   COLONOSCOPY WITH PROPOFOL N/A 02/01/2022   Procedure: COLONOSCOPY WITH PROPOFOL;  Surgeon: WoLucilla LameMD;  [facility];  Service: Endoscopy;  Laterality: N/A;   CORONARY STENT INTERVENTION N/A 07/30/2019   Procedure: CORONARY STENT INTERVENTION;  Surgeon: EnNelva BushMD;  [facility];  Service: Cardiovascular;  Laterality: N/A;  RCA   LEFT HEART CATH AND CORONARY ANGIOGRAPHY N/A 07/30/2019   Procedure: LEFT HEART CATH AND CORONARY ANGIOGRAPHY;  Surgeon: EnNelva BushMD;  [facility];  Service: Cardiovascular;  Laterality: N/A;    Current Meds  Medication Sig   Alirocumab (PRALUENT) 75 MG/ML SOAJ Inject 1 Dose into the skin every 14 (fourteen) days.   aspirin 81 MG tablet Take 81 mg by mouth daily.   cholecalciferol (VITAMIN D) 1000 UNITS tablet Take 1,000 Units by mouth daily.   clobetasol cream (TEMOVATE) 0.4.62 Apply 1 application topically 2 (two) times daily.   fenofibrate 54 MG tablet Take 1 tablet (54 mg total) by mouth daily.   glipiZIDE (  GLUCOTROL XL) 5 MG 24 hr tablet Take 1 tablet (5 mg total) by mouth daily.   losartan (COZAAR) 100 MG tablet TAKE 1 TABLET BY MOUTH DAILY   metFORMIN (GLUCOPHAGE) 1000 MG tablet TAKE 1 TABLET(1000 MG) BY MOUTH TWICE DAILY   metoprolol tartrate (LOPRESSOR) 50 MG tablet TAKE 1 TABLET(50 MG) BY MOUTH TWICE DAILY   Multiple Vitamin (MULTIVITAMIN WITH MINERALS) TABS tablet Take 1 tablet by mouth daily.   NASONEX 50 MCG/ACT nasal spray Place 2 sprays into the  nose daily as needed.   nitroGLYCERIN (NITROSTAT) 0.4 MG SL tablet Place 1 tablet (0.4 mg total) under the tongue every 5 (five) minutes as needed for chest pain.   sitaGLIPtin (JANUVIA) 50 MG tablet Take 1 tablet (50 mg total) by mouth daily.   vitamin E 400 UNIT capsule Take 400 Units by mouth daily.    Allergies: Atorvastatin, Erythromycin, Levaquin [levofloxacin in d5w], Penicillins, Rosuvastatin, Simvastatin, Tequin [gatifloxacin], Zoloft [sertraline hcl], and Zithromax [azithromycin]  Social History   Tobacco Use   Smoking status: Former   Smokeless tobacco: Never   Tobacco comments:    Quit 30 years ago  Vaping Use   Vaping Use: Never used  Substance Use Topics   Alcohol use: No    Alcohol/week: 0.0 standard drinks of alcohol   Drug use: No    Family History  Problem Relation Age of Onset   COPD Mother    COPD Father     Review of Systems: A 12-system review of systems was performed and was negative except as noted in the HPI.  --------------------------------------------------------------------------------------------------  Physical Exam: BP 114/60 (BP Location: Left Arm, Patient Position: Sitting, Cuff Size: Normal)   Pulse 75   Ht 5' 2.5" (1.588 m)   Wt 149 lb (67.6 kg)   SpO2 96%   BMI 26.82 kg/m   General:  NAD. Neck: No JVD or HJR. Lungs: Clear to auscultation bilaterally without wheezes or crackles. Heart: Regular rate and rhythm without murmurs, rubs, or gallops. Abdomen: Soft, nontender, nondistended. Extremities: No lower extremity edema.  EKG:  NSR with RBBB.  No change since 11/25/2021.  Lab Results  Component Value Date   WBC 5.2 05/12/2020   HGB 12.1 05/12/2020   HCT 36.3 05/12/2020   MCV 89.2 05/12/2020   PLT 174 05/12/2020    Lab Results  Component Value Date   NA 140 05/12/2020   K 5.0 05/12/2020   CL 108 05/12/2020   CO2 19 (L) 05/12/2020   BUN 19 05/12/2020   CREATININE 1.70 (H) 05/03/2022   GLUCOSE 149 (H) 05/12/2020    ALT 16 11/22/2021    Lab Results  Component Value Date   CHOL 122 03/11/2022   HDL 65 03/11/2022   LDLCALC 29 03/11/2022   LDLDIRECT 52.4 11/02/2020   TRIG 139 03/11/2022   CHOLHDL 1.9 03/11/2022    --------------------------------------------------------------------------------------------------  ASSESSMENT AND PLAN: CAD: No angina reported.  Continue current medications for secondary prevention.  Hyperlipidemia associated with type 2 diabetes mellitus: Lipids well-controlled on last check, following deescalation of Praluent at the direction of Dr. Alain Honey.  Continue current doses of Praluent and fenofibrate.  Donna Harper was previously intolerant of Vascepa.  Hypertension: BP very well-controlled today.  Continue current medications.  Acute kidney injury: Recent creatinine check in setting of symptomatic nephrolithiasis showed creatinine well-above baseline of 0.8 at 1.7.  I suspect NSAID use may have contributed to AKI.  I have advised her to avoid NSAIDs in the future.  We will  check a BMP today with plans to continue her current medications.  However, if renal function is still above baseline, we may need to consider holding losartan (at least temporarily) until creatinine stabilizes.  Follow-up: Return to clinic in 1 year.  Nelva Bush, MD 05/26/2022 2:52 PM

## 2022-05-26 NOTE — Patient Instructions (Signed)
Medication Instructions:  Your physician recommends that you continue on your current medications as directed. Please refer to the Current Medication list given to you today.  *If you need a refill on your cardiac medications before your next appointment, please call your pharmacy*   Lab Work: BMP today  Please have your lab drawn at the [facility]. Please stop at the Registration desk to check in.  If you have labs (blood work) drawn today and your tests are completely normal, you will receive your results only by: [place] (if you have MyChart) OR A paper copy in the mail If you have any lab test that is abnormal or we need to change your treatment, we will call you to review the results.   Testing/Procedures: None ordered   Follow-Up: At [facility], you and your health needs are our priority.  As part of our continuing mission to provide you with exceptional heart care, we have created designated Provider Care Teams.  These Care Teams include your primary Cardiologist (physician) and Advanced Practice Providers (APPs -  Physician Assistants and Nurse Practitioners) who all work together to provide you with the care you need, when you need it.  We recommend signing up for the patient portal called "MyChart".  Sign up information is provided on this After Visit Summary.  MyChart is used to connect with patients for Virtual Visits (Telemedicine).  Patients are able to view lab/test results, encounter notes, upcoming appointments, etc.  Non-urgent messages can be sent to your provider as well.   To learn more about what you can do with MyChart, go to [email or web address].    Your next appointment:   Your physician wants you to follow-up in: 1 year You will receive a reminder letter in the mail two months in advance. If you don't receive a letter, please call our office to schedule the follow-up appointment.   The format for your next appointment:   In  Person  Provider:   You may see Nelva Bush, MD or one of the following Advanced Practice Providers on your designated Care Team:   Murray Hodgkins, NP Christell Faith, PA-C Cadence Kathlen Mody, PA-C{   Other Instructions N/A  Important Information About Sugar

## 2022-05-26 NOTE — Telephone Encounter (Signed)
-----   Message from Nelva Bush, MD sent at 05/26/2022  4:01 PM EDT ----- Please let Ms. Carreno know that her kidney function has improved but is still not back to her baseline.  I suspect that recent NSAID use in the setting of her kidney stones may have caused this.  I recommend that she hold losartan for now and try to drink more water.  She should also reach out to her surgeon to ask about whether it is safe to proceed with her CT as planned or if it should be delayed until her kidney function is back to baseline.  In either case, we should repeat a BMP in 1-2 weeks.

## 2022-05-26 NOTE — Telephone Encounter (Signed)
Called and spoke with pt. Notified of lab results and Dr. Darnelle Bos recc below.  Pt voiced understanding. Pt will:    - HOLD Losartan   - Repeat BMP in 1-2 weeks   - Increase water intake  Pt states that she no longer has upcoming CT scan planned.  Orders placed for labs in 1-2 weeks.   Will follow up with lab results.  Pt has no further questions at this time.

## 2022-05-28 ENCOUNTER — Encounter: Payer: Self-pay | Admitting: Internal Medicine

## 2022-05-28 DIAGNOSIS — N179 Acute kidney failure, unspecified: Secondary | ICD-10-CM | POA: Insufficient documentation

## 2022-06-03 ENCOUNTER — Other Ambulatory Visit: Payer: Self-pay | Admitting: Internal Medicine

## 2022-06-13 ENCOUNTER — Other Ambulatory Visit
Admission: RE | Admit: 2022-06-13 | Discharge: 2022-06-13 | Disposition: A | Discharge status: Home / Self Care | Payer: Medicare HMO | Attending: Internal Medicine | Admitting: Internal Medicine

## 2022-06-13 DIAGNOSIS — N179 Acute kidney failure, unspecified: Secondary | ICD-10-CM | POA: Insufficient documentation

## 2022-06-13 LAB — BASIC METABOLIC PANEL
Anion gap: 7 (ref 5–15)
BUN: 20 mg/dL (ref 8–23)
CO2: 23 mmol/L (ref 22–32)
Calcium: 9.7 mg/dL (ref 8.9–10.3)
Chloride: 109 mmol/L (ref 98–111)
Creatinine, Ser: 1.13 mg/dL — ABNORMAL HIGH (ref 0.44–1.00)
GFR, Estimated: 52 mL/min — ABNORMAL LOW (ref >60)
Glucose, Bld: 71 mg/dL (ref 70–99)
Potassium: 4.7 mmol/L (ref 3.5–5.1)
Sodium: 139 mmol/L (ref 135–145)

## 2022-06-13 NOTE — Telephone Encounter (Signed)
Called and reminded pt regarding repeat BMET labs. Pt voices she will [facility].

## 2022-06-15 ENCOUNTER — Telehealth: Payer: Self-pay | Admitting: Internal Medicine

## 2022-06-15 DIAGNOSIS — Z79899 Other long term (current) drug therapy: Secondary | ICD-10-CM

## 2022-06-15 DIAGNOSIS — N179 Acute kidney failure, unspecified: Secondary | ICD-10-CM

## 2022-06-15 DIAGNOSIS — I10 Essential (primary) hypertension: Secondary | ICD-10-CM

## 2022-06-15 NOTE — Telephone Encounter (Signed)
Pt states she was told on 06/13/22 by Darlyne Russian RN to continue meds, however she is still confused if she should take losartan or not. Please advise.

## 2022-06-16 MED ORDER — LOSARTAN POTASSIUM 100 MG PO TABS: 50.0000 mg | ORAL_TABLET | Freq: Every day | ORAL | Status: DC

## 2022-06-16 NOTE — Telephone Encounter (Signed)
I spoke with Ms. Donna Harper regarding need to restart losartan.  She has not been monitoring her BP at home but has been feeling well.  I recommend that she restart losartan 50 mg daily (she can split her existing 100 mg tablets).  We will arrange for follow-up BMP in ~2-3 weeks.  In encouraged her to remain well-hydrated.  Nelva Bush, MD [facility]

## 2022-06-16 NOTE — Telephone Encounter (Signed)
Lab orders placed for BMP to be done in ~2-3 weeks at [facility].  Pt aware and pt and I have agreed I will call her to remind to have labs the week of July 24th.  [facility].   Pt appreciative of call and has no further questions at this time.

## 2022-06-16 NOTE — Telephone Encounter (Signed)
Pt was advised to hold Losartan based on lab results from 05/26/22. Pt had repeat BMET 06/13/22. Kidney function has improved but is still not back to baseline.  Will clarify with Dr. Saunders Revel if pt is to continue to hold Losartan in addition to avoiding NSAIDs, or may pt restart Losartan.

## 2022-06-18 ENCOUNTER — Other Ambulatory Visit: Payer: Self-pay | Admitting: Internal Medicine

## 2022-07-19 ENCOUNTER — Other Ambulatory Visit: Payer: Self-pay | Admitting: *Deleted

## 2022-07-19 MED ORDER — SITAGLIPTIN PHOSPHATE 100 MG PO TABS: 100.0000 mg | ORAL_TABLET | Freq: Every day | ORAL | 1 refills | Status: AC

## 2022-08-20 ENCOUNTER — Other Ambulatory Visit: Payer: Self-pay | Admitting: *Deleted

## 2022-09-02 ENCOUNTER — Other Ambulatory Visit: Payer: Self-pay | Admitting: Internal Medicine

## 2022-10-07 ENCOUNTER — Other Ambulatory Visit: Payer: Self-pay | Admitting: Internal Medicine

## 2022-10-19 ENCOUNTER — Ambulatory Visit (INDEPENDENT_AMBULATORY_CARE_PROVIDER_SITE_OTHER): Payer: Medicare HMO | Admitting: Internal Medicine

## 2022-10-19 VITALS — Ht 62.5 in | Wt 149.0 lb

## 2022-10-19 DIAGNOSIS — Z0000 Encounter for general adult medical examination without abnormal findings: Secondary | ICD-10-CM | POA: Diagnosis not present

## 2022-10-19 NOTE — Patient Instructions (Addendum)
Donna Harper , Thank you for taking time to come for your Medicare Wellness Visit. I appreciate your ongoing commitment to your health goals. Please review the following plan we discussed and let me know if I can assist you in the future.   These are the goals we discussed:  Goals      Maintain healthy lifestyle     Stay active Healthy diet Good water intake        This is a list of the screening recommended for you and due dates:  Health Maintenance  Topic Date Due   Mammogram  Never done   COVID-19 Vaccine (3 - Pfizer risk series) 11/04/2022*   Eye exam for diabetics  11/11/2022*   Hepatitis C Screening: USPSTF Recommendation to screen - Ages 18-79 yo.  11/11/2022*   Yearly kidney health urinalysis for diabetes  11/16/2022*   Complete foot exam   11/25/2022*   Hemoglobin A1C  12/09/2022*   Zoster (Shingles) Vaccine (1 of 2) 01/19/2023*   Flu Shot  03/12/2023*   Tetanus Vaccine  07/13/2023*   Pneumonia Vaccine (2 - PCV) 10/20/2023*   Yearly kidney function blood test for diabetes  06/14/2023   Medicare Annual Wellness Visit  10/20/2023   Colon Cancer Screening  02/02/2032   DEXA scan (bone density measurement)  Completed   HPV Vaccine  Aged Out  *Topic was postponed. The date shown is not the original due date.   Conditions/risks identified: none new  Next appointment: Follow up in one year for your annual wellness visit    Preventive Care 65 Years and Older, Female Preventive care refers to lifestyle choices and visits with your health care provider that can promote health and wellness. What does preventive care include? A yearly physical exam. This is also called an annual well check. Dental exams once or twice a year. Routine eye exams. Ask your health care provider how often you should have your eyes checked. Personal lifestyle choices, including: Daily care of your teeth and gums. Regular physical activity. Eating a healthy diet. Avoiding tobacco and drug  use. Limiting alcohol use. Practicing safe sex. Taking low-dose aspirin every day. Taking vitamin and mineral supplements as recommended by your health care provider. What happens during an annual well check? The services and screenings done by your health care provider during your annual well check will depend on your age, overall health, lifestyle risk factors, and family history of disease. Counseling  Your health care provider may ask you questions about your: Alcohol use. Tobacco use. Drug use. Emotional well-being. Home and relationship well-being. Sexual activity. Eating habits. History of falls. Memory and ability to understand (cognition). Work and work Statistician. Reproductive health. Screening  You may have the following tests or measurements: Height, weight, and BMI. Blood pressure. Lipid and cholesterol levels. These may be checked every 5 years, or more frequently if you are over 81 years old. Skin check. Lung cancer screening. You may have this screening every year starting at age 69 if you have a 30-pack-year history of smoking and currently smoke or have quit within the past 15 years. Fecal occult blood test (FOBT) of the stool. You may have this test every year starting at age 99. Flexible sigmoidoscopy or colonoscopy. You may have a sigmoidoscopy every 5 years or a colonoscopy every 10 years starting at age 38. Hepatitis C blood test. Hepatitis B blood test. Sexually transmitted disease (STD) testing. Diabetes screening. This is done by checking your blood sugar (glucose) after you  have not eaten for a while (fasting). You may have this done every 1-3 years. Bone density scan. This is done to screen for osteoporosis. You may have this done starting at age 75. Mammogram. This may be done every 1-2 years. Talk to your health care provider about how often you should have regular mammograms. Talk with your health care provider about your test results, treatment  options, and if necessary, the need for more tests. Vaccines  Your health care provider may recommend certain vaccines, such as: Influenza vaccine. This is recommended every year. Tetanus, diphtheria, and acellular pertussis (Tdap, Td) vaccine. You may need a Td booster every 10 years. Zoster vaccine. You may need this after age 80. Pneumococcal 13-valent conjugate (PCV13) vaccine. One dose is recommended after age 42. Pneumococcal polysaccharide (PPSV23) vaccine. One dose is recommended after age 61. Talk to your health care provider about which screenings and vaccines you need and how often you need them. This information is not intended to replace advice given to you by your health care provider. Make sure you discuss any questions you have with your health care provider. Document Released: 12/25/2015 Document Revised: 08/17/2016 Document Reviewed: 09/29/2015 Elsevier Interactive Patient Education  2017 [employer] Prevention in the Home Falls can cause injuries. They can happen to people of all ages. There are many things you can do to make your home safe and to help prevent falls. What can I do on the outside of my home? Regularly fix the edges of walkways and driveways and fix any cracks. Remove anything that might make you trip as you walk through a door, such as a raised step or threshold. Trim any bushes or trees on the path to your home. Use bright outdoor lighting. Clear any walking paths of anything that might make someone trip, such as rocks or tools. Regularly check to see if handrails are loose or broken. Make sure that both sides of any steps have handrails. Any raised decks and porches should have guardrails on the edges. Have any leaves, snow, or ice cleared regularly. Use sand or salt on walking paths during winter. Clean up any spills in your garage right away. This includes oil or grease spills. What can I do in the bathroom? Use night lights. Install grab  bars by the toilet and in the tub and shower. Do not use towel bars as grab bars. Use non-skid mats or decals in the tub or shower. If you need to sit down in the shower, use a plastic, non-slip stool. Keep the floor dry. Clean up any water that spills on the floor as soon as it happens. Remove soap buildup in the tub or shower regularly. Attach bath mats securely with double-sided non-slip rug tape. Do not have throw rugs and other things on the floor that can make you trip. What can I do in the bedroom? Use night lights. Make sure that you have a light by your bed that is easy to reach. Do not use any sheets or blankets that are too big for your bed. They should not hang down onto the floor. Have a firm chair that has side arms. You can use this for support while you get dressed. Do not have throw rugs and other things on the floor that can make you trip. What can I do in the kitchen? Clean up any spills right away. Avoid walking on wet floors. Keep items that you use a lot in easy-to-reach places. If you need  to reach something above you, use a strong step stool that has a grab bar. Keep electrical cords out of the way. Do not use floor polish or wax that makes floors slippery. If you must use wax, use non-skid floor wax. Do not have throw rugs and other things on the floor that can make you trip. What can I do with my stairs? Do not leave any items on the stairs. Make sure that there are handrails on both sides of the stairs and use them. Fix handrails that are broken or loose. Make sure that handrails are as long as the stairways. Check any carpeting to make sure that it is firmly attached to the stairs. Fix any carpet that is loose or worn. Avoid having throw rugs at the top or bottom of the stairs. If you do have throw rugs, attach them to the floor with carpet tape. Make sure that you have a light switch at the top of the stairs and the bottom of the stairs. If you do not have them,  ask someone to add them for you. What else can I do to help prevent falls? Wear shoes that: Do not have high heels. Have rubber bottoms. Are comfortable and fit you well. Are closed at the toe. Do not wear sandals. If you use a stepladder: Make sure that it is fully opened. Do not climb a closed stepladder. Make sure that both sides of the stepladder are locked into place. Ask someone to hold it for you, if possible. Clearly mark and make sure that you can see: Any grab bars or handrails. First and last steps. Where the edge of each step is. Use tools that help you move around (mobility aids) if they are needed. These include: Canes. Walkers. Scooters. Crutches. Turn on the lights when you go into a dark area. Replace any light bulbs as soon as they burn out. Set up your furniture so you have a clear path. Avoid moving your furniture around. If any of your floors are uneven, fix them. If there are any pets around you, be aware of where they are. Review your medicines with your doctor. Some medicines can make you feel dizzy. This can increase your chance of falling. Ask your doctor what other things that you can do to help prevent falls. This information is not intended to replace advice given to you by your health care provider. Make sure you discuss any questions you have with your health care provider. Document Released: 09/24/2009 Document Revised: 05/05/2016 Document Reviewed: 01/02/2015 Elsevier Interactive Patient Education  2017 [employer].

## 2022-10-19 NOTE — Progress Notes (Signed)
Subjective:   Donna Harper is a 71 y.o. female who presents for Medicare Annual (Subsequent) preventive examination.  Review of Systems    No ROS.  Medicare Wellness Virtual Visit.  Visual/audio telehealth visit, UTA vital signs.   See social history for additional risk factors.   Cardiac Risk Factors include: advanced age (>72mn, >>15women);diabetes mellitus;hypertension     Objective:    Today's Vitals   10/19/22 1050  Weight: 149 lb (67.6 kg)  Height: 5' 2.5" (1.588 m)   Body mass index is 26.82 kg/m.     10/19/2022   11:04 AM 02/01/2022    7:06 AM 09/03/2021    9:26 AM 08/26/2019    2:11 PM 07/30/2019    9:23 AM 07/30/2019    1:07 AM 07/29/2019    5:07 PM  Advanced Directives  Does Patient Have a Medical Advance Directive? No No No No   No  Would patient like information on creating a medical advance directive? No - Patient declined   No - Patient declined Yes (MAU/Ambulatory/Procedural Areas - Information given) No - Patient declined     Current Medications (verified) Outpatient Encounter Medications as of 10/19/2022  Medication Sig   Alirocumab (PRALUENT) 75 MG/ML SOAJ Inject 1 Dose into the skin every 14 (fourteen) days.   aspirin 81 MG tablet Take 81 mg by mouth daily.   cholecalciferol (VITAMIN D) 1000 UNITS tablet Take 1,000 Units by mouth daily.   clobetasol cream (TEMOVATE) 08.92% Apply 1 application topically 2 (two) times daily.   fenofibrate 54 MG tablet TAKE 1 TABLET(54 MG) BY MOUTH DAILY   glipiZIDE (GLUCOTROL XL) 5 MG 24 hr tablet TAKE 1 TABLET(5 MG) BY MOUTH DAILY   losartan (COZAAR) 100 MG tablet TAKE 1 TABLET BY MOUTH DAILY   metFORMIN (GLUCOPHAGE) 1000 MG tablet TAKE 1 TABLET(1000 MG) BY MOUTH TWICE DAILY   metoprolol tartrate (LOPRESSOR) 50 MG tablet TAKE 1 TABLET(50 MG) BY MOUTH TWICE DAILY   Multiple Vitamin (MULTIVITAMIN WITH MINERALS) TABS tablet Take 1 tablet by mouth daily.   NASONEX 50 MCG/ACT nasal spray Place 2 sprays into the nose daily  as needed.   nitroGLYCERIN (NITROSTAT) 0.4 MG SL tablet Place 1 tablet (0.4 mg total) under the tongue every 5 (five) minutes as needed for chest pain.   sitaGLIPtin (JANUVIA) 100 MG tablet Take 1 tablet (100 mg total) by mouth daily.   vitamin E 400 UNIT capsule Take 400 Units by mouth daily.   No facility-administered encounter medications on file as of 10/19/2022.    Allergies (verified) Atorvastatin, Erythromycin, Levaquin [levofloxacin in d5w], Penicillins, Rosuvastatin, Simvastatin, Tequin [gatifloxacin], Zoloft [sertraline hcl], and Zithromax [azithromycin]   History: Past Medical History:  Diagnosis Date   CAD (coronary artery disease)    a. 07/2019 NSTEMI/PCI: LM 15d, LAD 40p, 50/643mRI nl, LCX nl, OM1 small, RCA large, 30p, 9580m.0x12 Resolute Onyx DES), RPDA fills via L->R collats (OM1), EF 45-50%; b. 11/2020 MV: EF>65%, no ischemia/infact-->low risk.   GERD (gastroesophageal reflux disease)    Hyperlipidemia LDL goal <70    Hypertension    Ischemic cardiomyopathy    a. 07/30/2019 LV gram: EF 45-50%, basal inf HK; b. 07/31/2019 Echo: EF 55-60%, impaired relaxation, sev basal inf HK. Nl RV fxn.    Joint pain    Murmur    a. 07/2019 Echo: no significant valvular abnormalities. Mild AoV thickening.   [facility]  TIA (transient ischemic attack) 08/2014   Type II diabetes  [facility])    Past Surgical History:  Procedure Laterality Date   BREAST BIOPSY Bilateral 1988   benign/Dr Sankar   CARDIAC CATHETERIZATION     CESAREAN SECTION  1986   COLONOSCOPY WITH PROPOFOL N/A 02/01/2022   Procedure: COLONOSCOPY WITH PROPOFOL;  Surgeon: Lucilla Lame, MD;  Location: [facility] ENDOSCOPY;  Service: Endoscopy;  Laterality: N/A;   CORONARY STENT INTERVENTION N/A 07/30/2019   Procedure: CORONARY STENT INTERVENTION;  Surgeon: Nelva Bush, MD;  Location: [place] CV LAB;  Service: Cardiovascular;  Laterality: N/A;  RCA   LEFT HEART CATH AND CORONARY ANGIOGRAPHY N/A 07/30/2019    Procedure: LEFT HEART CATH AND CORONARY ANGIOGRAPHY;  Surgeon: Nelva Bush, MD;  Location: [place] CV LAB;  Service: Cardiovascular;  Laterality: N/A;   Family History  Problem Relation Age of Onset   COPD Mother    COPD Father    Social History   Socioeconomic History   Marital status: Married    Spouse name: Allyiah Gartner   Number of children: 3   Years of education: [place] education level: Some college, no degree  Occupational History   Not on file  Tobacco Use   Smoking status: Former   Smokeless tobacco: Never   Tobacco comments:    Quit 30 years ago  Vaping Use   Vaping Use: Never used  Substance and Sexual Activity   Alcohol use: No    Alcohol/week: 0.0 standard drinks of alcohol   Drug use: No   Sexual activity: Yes  Other Topics Concern   Not on file  Social History Narrative   Not on file   Social Determinants of Health   Financial Resource Strain: Low Risk  (10/19/2022)   Overall Financial Resource Strain (CARDIA)    Difficulty of Paying Living Expenses: Not very hard  Food Insecurity: No Food Insecurity (10/19/2022)   Hunger Vital Sign    Worried About Running Out of Food in the Last Year: Never true    Ran Out of Food in the Last Year: Never true  Transportation Needs: No Transportation Needs (10/19/2022)   PRAPARE - Hydrologist (Medical): No    Lack of Transportation (Non-Medical): No  Physical Activity: Sufficiently Active (10/19/2022)   Exercise Vital Sign    Days of Exercise per Week: 5 days    Minutes of Exercise per Session: 30 min  Stress: No Stress Concern Present (10/19/2022)   [place]    Feeling of Stress : Only a little  Social Connections: Unknown (10/19/2022)   Social Connection and Isolation Panel [NHANES]    Frequency of Communication with Friends and Family: More than three times a week    Frequency of Social  Gatherings with Friends and Family: Three times a week    Attends Religious Services: Not on file    Active Member of Clubs or Organizations: Yes    Attends Archivist Meetings: More than 4 times per year    Marital Status: Married    Tobacco Counseling Counseling given: Not Answered Tobacco comments: Quit 30 years ago   Clinical Intake:  Pre-visit preparation completed: Yes        Diabetes: Yes (Followed by PCP)  Nutrition Risk Assessment: Has the patient had any N/V/D within the last 2 months?  No  Does the patient have any non-healing wounds?  No  Has the patient had any unintentional weight loss or weight gain?  No   Financial Strains and Diabetes Management: Are you having any financial strains with the device, your supplies or your medication? No .  Does the patient want to be seen by Chronic Care Management for management of their diabetes?  No  Would the patient like to be referred to a Nutritionist or for Diabetic Management?  No   How often do you need to have someone help you when you read instructions, pamphlets, or other written materials from your doctor or pharmacy?: 1 - Never    Interpreter Needed?: No      Activities of Daily Living    10/19/2022   11:04 AM 10/17/2022    2:50 PM  In your present state of health, do you have any difficulty performing the following activities:  Hearing? 0 0  Vision? 0 0  Difficulty concentrating or making decisions? 0 0  Walking or climbing stairs? 0 0  Dressing or bathing? 0 0  Doing errands, shopping? 0 0  Preparing Food and eating ? N N  Using the Toilet? N N  In the past six months, have you accidently leaked urine? N N  Do you have problems with loss of bowel control? N N  Managing your Medications? N N  Managing your Finances? N N  Housekeeping or managing your Housekeeping? N N    Patient Care Team: Cletis Athens, MD as PCP - General (Internal Medicine) End, Harrell Gave, MD as PCP -  Cardiology (Cardiology) Laurey Morale Wonda Cheng, MD (Unknown Physician Specialty) Christene Lye, MD (General Surgery)  Indicate any recent Medical Services you may have received from other than [facility] providers in the past year (date may be approximate).     Assessment:   This is a routine wellness examination for Donna Harper.  I connected with  Donna Harper on 10/19/22 by a audio enabled telemedicine application and verified that I am speaking with the correct person using two identifiers.  Patient Location: Home  Provider Location: Office/Clinic  I discussed the limitations of evaluation and management by telemedicine. The patient expressed understanding and agreed to proceed.   Hearing/Vision screen Hearing Screening - Comments:: Patient is able to hear conversational tones without difficulty.  No issues reported.   Vision Screening - Comments:: Followed by [facility] Wears corrective lenses They have seen their ophthalmologist in the last 12 months.    Dietary issues and exercise activities discussed: Current Exercise Habits: Home exercise routine, Intensity: Mild Low carb diet Good water intake   Goals Addressed             This Visit's Progress    Maintain healthy lifestyle       Stay active Healthy diet Good water intake       Depression Screen    10/19/2022   10:54 AM 09/03/2021    9:26 AM 08/23/2021    2:43 PM 07/21/2021   12:49 PM 09/24/2019   12:10 PM 08/27/2019    3:38 PM  PHQ 2/9 Scores  PHQ - 2 Score 0 0 0 0 1 2  PHQ- 9 Score     5 5    Fall Risk    10/19/2022   11:14 AM 10/17/2022    2:50 PM 09/03/2021    9:28 AM 08/23/2021    2:43 PM 07/21/2021   12:55 PM  Fall Risk   Falls in the past year? 0 0 '1 1 1  '$ Number falls in past yr:   0 0 0  Injury with Fall?  $'1 1 1  'E$ Risk for fall due to : No Fall Risks No Fall Risks Other (Comment)  History of fall(s)  Follow up Falls evaluation completed Falls evaluation completed Falls evaluation  completed Falls evaluation completed Falls evaluation completed    [place]: Home free of loose throw rugs in walkways, pet beds, electrical cords, etc? Yes  Adequate lighting in your home to reduce risk of falls? Yes   ASSISTIVE DEVICES UTILIZED TO PREVENT FALLS: Life alert? No  Use of a cane, walker or w/c? No  Grab bars in the bathroom? Yes  Shower chair or bench in shower? Yes  Elevated toilet seat or a handicapped toilet? No   TIMED UP AND GO: Was the test performed? No .   Cognitive Function:        10/19/2022   11:04 AM 09/03/2021    9:34 AM  6CIT Screen  What Year? 0 points 0 points  What month? 0 points 0 points  What time? 0 points 0 points  Count back from 20 0 points 0 points  Months in reverse 0 points 0 points  Repeat phrase 0 points 0 points  Total Score 0 points 0 points    Immunizations Immunization History  Administered Date(s) Administered   Fluad Quad(high Dose 65+) 09/29/2020, 10/26/2021   Influenza-Unspecified 10/12/2018   PFIZER(Purple Top)SARS-COV-2 Vaccination 01/22/2020, 02/12/2020   Pneumococcal Polysaccharide-23 07/31/2019   Tdap vaccine- Due, Education has been provided regarding the importance of this vaccine. Advised may receive this vaccine at local pharmacy or Health Dept. Aware to provide a copy of the vaccination record if obtained from local pharmacy or Health Dept. Verbalized acceptance and understanding.  Flu Vaccine status: Due, Education has been provided regarding the importance of this vaccine. Advised may receive this vaccine at local pharmacy or Health Dept. Aware to provide a copy of the vaccination record if obtained from local pharmacy or Health Dept. Verbalized acceptance and understanding.  Pneumococcal vaccine status: Due, Education has been provided regarding the importance of this vaccine. Advised may receive this vaccine at local pharmacy or Health Dept. Aware to provide a copy of the  vaccination record if obtained from local pharmacy or Health Dept. Verbalized acceptance and understanding.  Covid-19 vaccine status: Completed vaccines x2.  Shingrix Completed?: No.    Education has been provided regarding the importance of this vaccine. Patient has been advised to call insurance company to determine out of pocket expense if they have not yet received this vaccine. Advised may also receive vaccine at local pharmacy or Health Dept. Verbalized acceptance and understanding.  Screening Tests Health Maintenance  Topic Date Due   MAMMOGRAM  Never done   COVID-19 Vaccine (3 - Pfizer risk series) 11/04/2022 (Originally 03/11/2020)   OPHTHALMOLOGY EXAM  11/11/2022 (Originally 04/15/2022)   Hepatitis C Screening  11/11/2022 (Originally 04/14/1969)   Diabetic kidney evaluation - Urine ACR  11/16/2022 (Originally 04/14/1969)   FOOT EXAM  11/25/2022 (Originally [birth year]14/1961)   HEMOGLOBIN A1C  12/09/2022 (Originally 05/23/2022)   Zoster Vaccines- Shingrix (1 of 2) 01/19/2023 (Originally 04/14/1970)   INFLUENZA VACCINE  03/12/2023 (Originally 07/12/2022)   TETANUS/TDAP  07/13/2023 (Originally [birth year]14/1970)   Pneumonia Vaccine 71+ Years old (2 - PCV) 10/20/2023 (Originally 07/30/2020)   Diabetic kidney evaluation - GFR measurement  06/14/2023   Medicare Annual Wellness (AWV)  10/20/2023   COLONOSCOPY (Pts 45-52yr Insurance coverage will need to be confirmed)  02/02/2032   DEXA SCAN  Completed   HPV VACCINES  Aged Out   Health Maintenance Health Maintenance Due  Topic Date Due   MAMMOGRAM  Never done   Lung Cancer Screening: (Low Dose CT Chest recommended if Age 37-80 years, 30 pack-year currently smoking OR have quit w/in 15years.) does not qualify.   Hepatitis C Screening: deferred.   Vision Screening: Recommended annual ophthalmology exams for early detection of glaucoma and other disorders of the eye.  Dental Screening: Recommended annual dental exams for proper oral hygiene. Visits every 6  months.   Community Resource Referral / Chronic Care Management: CRR required this visit?  No   CCM required this visit?  No      Plan:     I have personally reviewed and noted the following in the patient's chart:   Medical and social history Use of alcohol, tobacco or illicit drugs  Current medications and supplements including opioid prescriptions. Patient is not currently taking opioid prescriptions. Functional ability and status Nutritional status Physical activity Advanced directives List of other physicians Hospitalizations, surgeries, and ER visits in previous 12 months Vitals Screenings to include cognitive, depression, and falls Referrals and appointments  In addition, I have reviewed and discussed with patient certain preventive protocols, quality metrics, and best practice recommendations. A written personalized care plan for preventive services as well as general preventive health recommendations were provided to patient.     Leta Jungling, LPN   [id number]

## 2022-10-21 NOTE — Progress Notes (Signed)
I have reviewed this visit and agree with the documentation.   

## 2022-10-26 DIAGNOSIS — N390 Urinary tract infection, site not specified: Secondary | ICD-10-CM | POA: Diagnosis not present

## 2022-10-26 DIAGNOSIS — R300 Dysuria: Secondary | ICD-10-CM | POA: Diagnosis not present

## 2022-11-09 ENCOUNTER — Other Ambulatory Visit: Payer: Self-pay | Admitting: Internal Medicine

## 2022-11-21 ENCOUNTER — Telehealth: Payer: Self-pay | Admitting: Internal Medicine

## 2022-11-21 DIAGNOSIS — Z79899 Other long term (current) drug therapy: Secondary | ICD-10-CM

## 2022-11-21 NOTE — Telephone Encounter (Signed)
Pt called reporting she received a letter from her insurance company stating they will no longer cover praluent in 2024. Instead, they recommend an alternative such as repatha.  Will forward to MD to make aware.

## 2022-11-21 NOTE — Telephone Encounter (Signed)
Pt c/o medication issue:  1. Name of Medication:   PRALUENT 75 MG/ML SOAJ    2. How are you currently taking this medication (dosage and times per day)? As prescribed   3. Are you having a reaction (difficulty breathing--STAT)?   4. What is your medication issue? Pt states that her insurance has sent her a letter stating this medication will no longer be covered and they recommended Repatha in its place. Requesting call back to discuss.

## 2022-11-22 NOTE — Telephone Encounter (Signed)
I recommend that we switch Donna Harper to Repatha 140 mg subcutaneously every 14 days.  We should check a fasting lipid panel and ALT in ~3 months after the switch to ensure appropriate response.  Thanks.  Nelva Bush, MD [facility]

## 2022-11-23 ENCOUNTER — Ambulatory Visit (INDEPENDENT_AMBULATORY_CARE_PROVIDER_SITE_OTHER): Payer: Medicare HMO

## 2022-11-23 DIAGNOSIS — Z23 Encounter for immunization: Secondary | ICD-10-CM

## 2022-11-23 MED ORDER — REPATHA SURECLICK 140 MG/ML ~~LOC~~ SOAJ: 140.0000 mg | SUBCUTANEOUS | 0 refills | Status: DC

## 2022-11-23 NOTE — Telephone Encounter (Signed)
Pt made aware of MD's recommendations. Praulent d/c and prescription for repatha sent to the requested pharmacy.  Repeat labs ordered

## 2022-11-24 NOTE — Telephone Encounter (Signed)
Wont be able to change to Repatha until Jan  Key from [place]

## 2022-11-25 ENCOUNTER — Encounter: Payer: Self-pay | Admitting: Pharmacist

## 2022-11-25 NOTE — Telephone Encounter (Signed)
Repatha PA request received. Med currently non formulary but will be covered on Jan 1. Will wait and submit prior authorization then so that pt doesn't need to change to Praluent for 1 injection and then change back to Repatha.

## 2022-11-25 NOTE — Telephone Encounter (Signed)
This encounter was created in error - please disregard.

## 2022-11-25 NOTE — Telephone Encounter (Signed)
See below, cannot complete until after Jan 1 when formulary changes.

## 2022-11-25 NOTE — Telephone Encounter (Signed)
[facility] is call to verify forms were received for Repatha. Requesting call back.

## 2022-11-26 ENCOUNTER — Other Ambulatory Visit: Payer: Self-pay | Admitting: Internal Medicine

## 2022-12-14 ENCOUNTER — Telehealth: Payer: Self-pay | Admitting: Pharmacist

## 2022-12-14 MED ORDER — REPATHA SURECLICK 140 MG/ML ~~LOC~~ SOAJ: 140.0000 mg | SUBCUTANEOUS | 3 refills | Status: DC

## 2022-12-14 NOTE — Telephone Encounter (Signed)
See phone note 12/11 - Repatha PA submitted, Key: [id number] and approved through 12/12/23. Refill sent to pharmacy.

## 2022-12-19 ENCOUNTER — Telehealth: Payer: Self-pay | Admitting: Internal Medicine

## 2022-12-19 NOTE — Telephone Encounter (Signed)
Spoke to patient.  She is concerned about the fact that she used to be on Praluent 150 mg and received an LDL-C of negative number.  I explained to her that we cannot compare Repatha and Praluent strengths directly.  I also explained that that was a calculated number and then if we had ordered a direct LDL it would have come back with a number.  Also explained there is no such thing is too low of an LDL.  Her insurance is not going to approve Praluent unless she has failed Repatha.  I encouraged her to try the Repatha, she has agreed to try.

## 2022-12-19 NOTE — Telephone Encounter (Signed)
Pt c/o medication issue:  1. Name of Medication:   Evolocumab (REPATHA SURECLICK) 009 MG/ML SOAJ     2. How are you currently taking this medication (dosage and times per day)?   3. Are you having a reaction (difficulty breathing--STAT)?   4. What is your medication issue? Pt states when she was on Praulent she was only taking 75 mg. Now she is on Repatha '140mg'$  and she is a little hesitant taking it since the dosage is higher, please advise.

## 2023-02-10 ENCOUNTER — Other Ambulatory Visit
Admission: RE | Admit: 2023-02-10 | Discharge: 2023-02-10 | Disposition: A | Discharge status: Home / Self Care | Payer: Medicare HMO | Attending: Internal Medicine | Admitting: Internal Medicine

## 2023-02-10 DIAGNOSIS — I10 Essential (primary) hypertension: Secondary | ICD-10-CM | POA: Insufficient documentation

## 2023-02-10 DIAGNOSIS — Z79899 Other long term (current) drug therapy: Secondary | ICD-10-CM | POA: Insufficient documentation

## 2023-02-10 DIAGNOSIS — N179 Acute kidney failure, unspecified: Secondary | ICD-10-CM | POA: Insufficient documentation

## 2023-02-10 LAB — BASIC METABOLIC PANEL
Anion gap: 10 (ref 5–15)
BUN: 31 mg/dL — ABNORMAL HIGH (ref 8–23)
CO2: 25 mmol/L (ref 22–32)
Calcium: 9.5 mg/dL (ref 8.9–10.3)
Chloride: 105 mmol/L (ref 98–111)
Creatinine, Ser: 1.66 mg/dL — ABNORMAL HIGH (ref 0.44–1.00)
GFR, Estimated: 33 mL/min — ABNORMAL LOW (ref >60)
Glucose, Bld: 107 mg/dL — ABNORMAL HIGH (ref 70–99)
Potassium: 4.6 mmol/L (ref 3.5–5.1)
Sodium: 140 mmol/L (ref 135–145)

## 2023-02-10 LAB — LIPID PANEL
Cholesterol: 140 mg/dL (ref 0–200)
HDL: 65 mg/dL (ref >40)
LDL Cholesterol: 42 mg/dL (ref 0–99)
Total CHOL/HDL Ratio: 2.2 RATIO
Triglycerides: 164 mg/dL — ABNORMAL HIGH (ref <150)
VLDL: 33 mg/dL (ref 0–40)

## 2023-02-10 LAB — ALT: ALT: 15 U/L (ref 0–44)

## 2023-02-20 ENCOUNTER — Other Ambulatory Visit: Payer: Self-pay | Admitting: Internal Medicine

## 2023-02-20 DIAGNOSIS — Z1231 Encounter for screening mammogram for malignant neoplasm of breast: Secondary | ICD-10-CM

## 2023-03-23 ENCOUNTER — Ambulatory Visit
Admission: RE | Admit: 2023-03-23 | Discharge: 2023-03-23 | Disposition: A | Discharge status: Home / Self Care | Payer: Medicare HMO | Source: Ambulatory Visit | Attending: Internal Medicine | Admitting: Internal Medicine

## 2023-03-23 DIAGNOSIS — Z1231 Encounter for screening mammogram for malignant neoplasm of breast: Secondary | ICD-10-CM | POA: Diagnosis not present

## 2023-03-27 ENCOUNTER — Inpatient Hospital Stay
Admission: RE | Admit: 2023-03-27 | Discharge: 2023-03-27 | Disposition: A | Discharge status: Home / Self Care | Payer: Self-pay | Source: Ambulatory Visit | Attending: Internal Medicine | Admitting: Internal Medicine

## 2023-03-27 ENCOUNTER — Other Ambulatory Visit: Payer: Self-pay | Admitting: *Deleted

## 2023-03-27 DIAGNOSIS — Z1231 Encounter for screening mammogram for malignant neoplasm of breast: Secondary | ICD-10-CM

## 2023-03-30 ENCOUNTER — Telehealth: Payer: Self-pay | Admitting: Internal Medicine

## 2023-03-30 MED ORDER — METOPROLOL TARTRATE 50 MG PO TABS: ORAL_TABLET | ORAL | 0 refills | Status: DC

## 2023-03-30 NOTE — Telephone Encounter (Signed)
Requested Prescriptions   Signed Prescriptions Disp Refills   metoprolol tartrate (LOPRESSOR) 50 MG tablet 180 tablet 0    Sig: TAKE 1 TABLET(50 MG) BY MOUTH TWICE DAILY PLEASE SCHEDULE OFFICE VISIT FOR FURTHER REFILLS. THANK YOU!    Authorizing Provider: END, CHRISTOPHER    Ordering User: Thayer Headings, Lael Pilch L

## 2023-03-30 NOTE — Telephone Encounter (Signed)
*  STAT* If patient is at the pharmacy, call can be transferred to refill team.   1. Which medications need to be refilled? (please list name of each medication and dose if known) metoprolol tartrate (LOPRESSOR) 50 MG tablet   2. Which pharmacy/location (including street and city if local pharmacy) is medication to be sent to?  [id number] - [place], [place] - [address] AT [facility] OF [address]   3. Do they need a 30 day or 90 day supply? 90 day   Patient has 2-3 pills left.

## 2023-05-10 ENCOUNTER — Other Ambulatory Visit
Admission: RE | Admit: 2023-05-10 | Discharge: 2023-05-10 | Disposition: A | Discharge status: Home / Self Care | Payer: Medicare HMO | Source: Ambulatory Visit | Attending: Internal Medicine | Admitting: Internal Medicine

## 2023-05-10 DIAGNOSIS — Z0189 Encounter for other specified special examinations: Secondary | ICD-10-CM | POA: Insufficient documentation

## 2023-05-10 DIAGNOSIS — E119 Type 2 diabetes mellitus without complications: Secondary | ICD-10-CM | POA: Diagnosis not present

## 2023-05-10 DIAGNOSIS — I2510 Atherosclerotic heart disease of native coronary artery without angina pectoris: Secondary | ICD-10-CM | POA: Diagnosis not present

## 2023-05-10 DIAGNOSIS — E785 Hyperlipidemia, unspecified: Secondary | ICD-10-CM | POA: Diagnosis not present

## 2023-05-10 DIAGNOSIS — M60859 Other myositis, unspecified thigh: Secondary | ICD-10-CM | POA: Diagnosis not present

## 2023-05-10 DIAGNOSIS — I10 Essential (primary) hypertension: Secondary | ICD-10-CM | POA: Diagnosis not present

## 2023-05-10 LAB — CBC
HCT: 32.9 % — ABNORMAL LOW (ref 36.0–46.0)
Hemoglobin: 11.1 g/dL — ABNORMAL LOW (ref 12.0–15.0)
MCH: 30.8 pg (ref 26.0–34.0)
MCHC: 33.7 g/dL (ref 30.0–36.0)
MCV: 91.4 fL (ref 80.0–100.0)
Platelets: 239 10*3/uL (ref 150–400)
RBC: 3.6 MIL/uL — ABNORMAL LOW (ref 3.87–5.11)
RDW: 14.2 % (ref 11.5–15.5)
WBC: 5.4 10*3/uL (ref 4.0–10.5)
nRBC: 0.6 % — ABNORMAL HIGH (ref 0.0–0.2)

## 2023-05-10 LAB — CK: Total CK: 106 U/L (ref 38–234)

## 2023-05-10 LAB — C-REACTIVE PROTEIN: CRP: 0.8 mg/dL (ref <1.0)

## 2023-05-11 LAB — ENA+DNA/DS+SJORGEN'S
ENA SM Ab Ser-aCnc: <0.2 AI (ref 0.0–0.9)
Ribonucleic Protein: <0.2 AI (ref 0.0–0.9)
SSA (Ro) (ENA) Antibody, IgG: <0.2 AI (ref 0.0–0.9)
SSB (La) (ENA) Antibody, IgG: <0.2 AI (ref 0.0–0.9)
ds DNA Ab: 69 IU/mL — ABNORMAL HIGH (ref 0–9)

## 2023-05-11 LAB — ANA W/REFLEX: Anti Nuclear Antibody (ANA): POSITIVE — AB

## 2023-05-12 LAB — RHEUMATOID FACTOR: Rheumatoid fact SerPl-aCnc: 11 IU/mL (ref <14.0)

## 2023-05-19 ENCOUNTER — Other Ambulatory Visit: Payer: Self-pay | Admitting: Internal Medicine

## 2023-05-19 ENCOUNTER — Other Ambulatory Visit
Admission: RE | Admit: 2023-05-19 | Discharge: 2023-05-19 | Disposition: A | Discharge status: Home / Self Care | Payer: Medicare HMO | Source: Ambulatory Visit | Attending: Internal Medicine | Admitting: Internal Medicine

## 2023-05-19 ENCOUNTER — Ambulatory Visit
Admission: RE | Admit: 2023-05-19 | Discharge: 2023-05-19 | Disposition: A | Discharge status: Home / Self Care | Payer: Medicare HMO | Source: Ambulatory Visit | Attending: Internal Medicine | Admitting: Internal Medicine

## 2023-05-19 DIAGNOSIS — R059 Cough, unspecified: Secondary | ICD-10-CM | POA: Insufficient documentation

## 2023-05-19 DIAGNOSIS — J45909 Unspecified asthma, uncomplicated: Secondary | ICD-10-CM | POA: Diagnosis not present

## 2023-05-19 DIAGNOSIS — D649 Anemia, unspecified: Secondary | ICD-10-CM | POA: Insufficient documentation

## 2023-05-19 LAB — IRON AND TIBC
Iron: 65 ug/dL (ref 28–170)
Saturation Ratios: 13 % (ref 10.4–31.8)
TIBC: 503 ug/dL — ABNORMAL HIGH (ref 250–450)
UIBC: 438 ug/dL

## 2023-05-19 LAB — VITAMIN B12: Vitamin B-12: 183 pg/mL (ref 180–914)

## 2023-05-19 LAB — FOLATE: Folate: >40 ng/mL (ref >5.9)

## 2023-05-20 ENCOUNTER — Other Ambulatory Visit: Payer: Self-pay | Admitting: Internal Medicine

## 2023-05-22 ENCOUNTER — Other Ambulatory Visit: Payer: Self-pay | Admitting: Internal Medicine

## 2023-05-23 DIAGNOSIS — E119 Type 2 diabetes mellitus without complications: Secondary | ICD-10-CM | POA: Diagnosis not present

## 2023-05-23 DIAGNOSIS — Z1331 Encounter for screening for depression: Secondary | ICD-10-CM | POA: Diagnosis not present

## 2023-05-23 DIAGNOSIS — I2510 Atherosclerotic heart disease of native coronary artery without angina pectoris: Secondary | ICD-10-CM | POA: Diagnosis not present

## 2023-05-23 DIAGNOSIS — J209 Acute bronchitis, unspecified: Secondary | ICD-10-CM | POA: Diagnosis not present

## 2023-05-24 DIAGNOSIS — Z0120 Encounter for dental examination and cleaning without abnormal findings: Secondary | ICD-10-CM | POA: Diagnosis not present

## 2023-06-21 DIAGNOSIS — I2510 Atherosclerotic heart disease of native coronary artery without angina pectoris: Secondary | ICD-10-CM | POA: Diagnosis not present

## 2023-06-21 DIAGNOSIS — R0609 Other forms of dyspnea: Secondary | ICD-10-CM | POA: Diagnosis not present

## 2023-06-21 DIAGNOSIS — R053 Chronic cough: Secondary | ICD-10-CM | POA: Diagnosis not present

## 2023-07-03 ENCOUNTER — Other Ambulatory Visit: Payer: Self-pay | Admitting: Internal Medicine

## 2023-07-03 NOTE — Telephone Encounter (Signed)
Please contact pt for future appointment. Pt overdue for 12 month f/u. 

## 2023-07-05 ENCOUNTER — Encounter: Payer: Self-pay | Admitting: Medical

## 2023-07-05 ENCOUNTER — Ambulatory Visit: Payer: Medicare HMO | Attending: Medical | Admitting: Medical

## 2023-07-05 VITALS — BP 154/72 | HR 62 | Ht 62.5 in | Wt 149.8 lb

## 2023-07-05 DIAGNOSIS — N179 Acute kidney failure, unspecified: Secondary | ICD-10-CM | POA: Diagnosis not present

## 2023-07-05 DIAGNOSIS — I10 Essential (primary) hypertension: Secondary | ICD-10-CM

## 2023-07-05 DIAGNOSIS — I2510 Atherosclerotic heart disease of native coronary artery without angina pectoris: Secondary | ICD-10-CM | POA: Diagnosis not present

## 2023-07-05 DIAGNOSIS — E782 Mixed hyperlipidemia: Secondary | ICD-10-CM | POA: Diagnosis not present

## 2023-07-05 NOTE — Patient Instructions (Signed)
Medication Instructions:  Your physician recommends that you continue on your current medications as directed. Please refer to the Current Medication list given to you today.  *If you need a refill on your cardiac medications before your next appointment, please call your pharmacy*  Lab Work: Your physician recommends that you get lab work completed: Engineer, civil (consulting) at [facility] 1st desk on the right to check in (REGISTRATION)  Lab hours: Monday- Friday (7:30 am- 5:30 pm)  If you have labs (blood work) drawn today and your tests are completely normal, you will receive your results only by: MyChart Message (if you have MyChart) OR A paper copy in the mail If you have any lab test that is abnormal or we need to change your treatment, we will call you to review the results.  Testing/Procedures: -None ordered  Follow-Up: At [facility], you and your health needs are our priority.  As part of our continuing mission to provide you with exceptional heart care, we have created designated Provider Care Teams.  These Care Teams include your primary Cardiologist (physician) and Advanced Practice Providers (APPs -  Physician Assistants and Nurse Practitioners) who all work together to provide you with the care you need, when you need it.  Your next appointment:   1 year(s)  Provider:   You may see [name], MD or one of the following Advanced Practice Providers on your designated Care Team:   Nicolasa Ducking, NP Eula Listen, PA-C Cadence Fransico Michael, PA-C Charlsie Quest, NP    Other Instructions -None

## 2023-07-05 NOTE — Progress Notes (Signed)
Cardiology Office Note:    Date:  07/05/2023   ID:  Donna Harper, DOB Aug 07, 1951, MRN [id number]  PCP:  Barbette Reichmann, MD  [facility] Cardiologist:  [name], MD  [facility] Electrophysiologist:  None   Referring MD: Corky Downs, MD   Chief Complaint: 1 year follow-up  History of Present Illness:    Donna Harper is a 72 y.o. female with a hx of history of CAD status post PCI to the RCA in the setting of non-STEMI in August 2020, hypertension, hyperlipidemia, diabetes type 2, TIA, and GERD who presents for 1 year follow-up.  In August 2020, the patient was admitted to [facility] with exertional chest pain.  She ruled in for non-STEMI and underwent cardiac cath which showed moderate nonobstructive LAD disease and a 95% stenosis in the mid RCA.  The RCA was successfully treated with a drug-eluting stent.  EF was 45 to 50% on ventriculography but 55 to 60% with severe basal inferior hypokinesis on echo.  She did well post PCI.  Patient had left-sided jaw and arm pain that lasted a few hours and underwent subsequent stress test.  This was done in December 2020, and showed no evidence of ischemia or infarction, with normal LV function.  The patient was last seen 05/26/22 and stable from a cardiac perspective.   Today, the patient reports she is overall doing well. BP is a little high. She doesn't check it at home. She denies chest pain. She had lung infection a couple weeks back and was treated with antibiotics. She has some residual SOB from this. She denies lower leg edema, orthopnea, pnd, lightheadedness or dizziness. Re-check BP 154/72.   Past Medical History:  Diagnosis Date   CAD (coronary artery disease)    a. 07/2019 NSTEMI/PCI: LM 15d, LAD 40p, 50/86m, RI nl, LCX nl, OM1 small, RCA large, 30p, 50m (3.0x12 Resolute Onyx DES), RPDA fills via L->R collats (OM1), EF 45-50%; b. 11/2020 MV: EF>65%, no ischemia/infact-->low risk.   GERD (gastroesophageal reflux disease)     Hyperlipidemia LDL goal <70    Hypertension    Ischemic cardiomyopathy    a. 07/30/2019 LV gram: EF 45-50%, basal inf HK; b. 07/31/2019 Echo: EF 55-60%, impaired relaxation, sev basal inf HK. Nl RV fxn.    Joint pain    Murmur    a. 07/2019 Echo: no significant valvular abnormalities. Mild AoV thickening.   Stroke [facility])    TIA (transient ischemic attack) 08/2014   Type II diabetes mellitus (HCC)     Past Surgical History:  Procedure Laterality Date   BREAST BIOPSY Bilateral 1988   benign/Dr Sankar   CARDIAC CATHETERIZATION     CESAREAN SECTION  1986   COLONOSCOPY WITH PROPOFOL N/A 02/01/2022   Procedure: COLONOSCOPY WITH PROPOFOL;  Surgeon: Midge Minium, MD;  Location[facility] ENDOSCOPY;  Service: Endoscopy;  Laterality: N/A;   CORONARY STENT INTERVENTION N/A 07/30/2019   Procedure: CORONARY STENT INTERVENTION;  Surgeon: [name], MD;  Location: [facility] INVASIVE CV LAB;  Service: Cardiovascular;  Laterality: N/A;  RCA   LEFT HEART CATH AND CORONARY ANGIOGRAPHY N/A 07/30/2019   Procedure: LEFT HEART CATH AND CORONARY ANGIOGRAPHY;  Surgeon: [name], MD;  Location: [facility] INVASIVE CV LAB;  Service: Cardiovascular;  Laterality: N/A;    Current Medications: Current Meds  Medication Sig   aspirin 81 MG tablet Take 81 mg by mouth daily.   cholecalciferol (VITAMIN D) 1000 UNITS tablet Take 1,000 Units by mouth daily.   Evolocumab (REPATHA  SURECLICK) 140 MG/ML SOAJ Inject 140 mg into the skin every 14 (fourteen) days.   fenofibrate 54 MG tablet TAKE 1 TABLET(54 MG) BY MOUTH DAILY   glipiZIDE (GLUCOTROL XL) 5 MG 24 hr tablet TAKE 1 TABLET(5 MG) BY MOUTH DAILY   losartan (COZAAR) 100 MG tablet TAKE 1 TABLET BY MOUTH DAILY   metFORMIN (GLUCOPHAGE) 1000 MG tablet TAKE 1 TABLET(1000 MG) BY MOUTH TWICE DAILY   metoprolol tartrate (LOPRESSOR) 50 MG tablet TAKE 1 TABLET(50 MG) BY MOUTH TWICE DAILY PLEASE SCHEDULE OFFICE VISIT FOR FURTHER REFILLS. THANK YOU!   Multiple Vitamin (MULTIVITAMIN  WITH MINERALS) TABS tablet Take 1 tablet by mouth daily.   NASONEX 50 MCG/ACT nasal spray Place 2 sprays into the nose daily as needed.   nitroGLYCERIN (NITROSTAT) 0.4 MG SL tablet Place 1 tablet (0.4 mg total) under the tongue every 5 (five) minutes as needed for chest pain.   sitaGLIPtin (JANUVIA) 100 MG tablet Take 1 tablet (100 mg total) by mouth daily.   vitamin E 400 UNIT capsule Take 400 Units by mouth daily.     Allergies:   Atorvastatin, Erythromycin, Levaquin [levofloxacin in d5w], Penicillins, Rosuvastatin, Simvastatin, Tequin [gatifloxacin], Zoloft [sertraline hcl], and Zithromax [azithromycin]   Social History   Socioeconomic History   Marital status: Married    Spouse name: Kieran Howdyshell   Number of children: 3   Years of education: Financial planner education level: Some college, no degree  Occupational History   Not on file  Tobacco Use   Smoking status: Former   Smokeless tobacco: Never   Tobacco comments:    Quit 30 years ago  Vaping Use   Vaping status: Never Used  Substance and Sexual Activity   Alcohol use: No    Alcohol/week: 0.0 standard drinks of alcohol   Drug use: No   Sexual activity: Yes  Other Topics Concern   Not on file  Social History Narrative   Not on file   Social Determinants of Health   Financial Resource Strain: Low Risk  (05/23/2023)   Received from [facility], [facility]   Overall Financial Resource Strain (CARDIA)    Difficulty of Paying Living Expenses: Not hard at all  Food Insecurity: No Food Insecurity (05/23/2023)   Received from [facility], [facility]   Hunger Vital Sign    Worried About Running Out of Food in the Last Year: Never true    Ran Out of Food in the Last Year: Never true  Transportation Needs: No Transportation Needs (05/23/2023)   Received from [facility], [facility]   PRAPARE -  Transportation    In the past 12 months, has lack of transportation kept you from medical appointments or from getting medications?: No    Lack of Transportation (Non-Medical): No  Physical Activity: Sufficiently Active (10/19/2022)   Exercise Vital Sign    Days of Exercise per Week: 5 days    Minutes of Exercise per Session: 30 min  Stress: No Stress Concern Present (10/19/2022)   [employer] of Occupational Health - Occupational Stress Questionnaire    Feeling of Stress : Only a little  Social Connections: Unknown (10/19/2022)   Social Connection and Isolation Panel [NHANES]    Frequency of Communication with Friends and Family: More than three times a week    Frequency of Social Gatherings with Friends and Family: Three times a week    Attends Religious Services:  Not on file    Active Member of Clubs or Organizations: Yes    Attends Club or Organization Meetings: More than 4 times per year    Marital Status: Married     Family History: The patient's family history includes COPD in her father and mother.  ROS:   Please see the history of present illness.     All other systems reviewed and are negative.  EKGs/Labs/Other Studies Reviewed:    The following studies were reviewed today:  Myoview lexiscan 11/2020 Narrative & Impression  T wave inversion was noted during stress in the V4 and V2 leads. There was no ST segment deviation noted during stress. The study is normal. This is a low risk study. The left ventricular ejection fraction is hyperdynamic (>65%). CT attenuation images show minimal aortic and coronary calcifications.   Echo 07/2019 1. Severe hypokinesis of the left ventricular, basal inferior segment.   2. The left ventricle has normal systolic function, with an ejection  fraction of 55-60%. The cavity size was normal. There is mildly increased  left ventricular wall thickness. Left ventricular diastolic Doppler  parameters are consistent with impaired   relaxation. Elevated mean left atrial pressure.   3. The right ventricle has normal systolic function. The cavity was  normal. There is no increase in right ventricular wall thickness. Right  ventricular systolic pressure could not be assessed.   4. The aortic valve has an indeterminate number of cusps. Mild thickening  of the aortic valve.   5. The aorta is normal unless otherwise noted.   6. The interatrial septum was not well visualized.   LHC 2020 Conclusions: Significant two-vessel coronary artery disease with multifocal mid LAD disease of up to 60-70% and 95% mid RCA stenosis. Basal inferior hypokinesis with otherwise preserved left ventricular systolic function; LVEF 45-50%. Normal left ventricular filling pressure. Successful PCI to mid RCA using Resolute Onyx 3.0 x 12 mm DES with 0% residual stenosis and TIMI-3 flow.   Recommendations: Complete 2 hours of cangrelor infusion. Dual antiplatelet therapy with aspirin and ticagrelor for at least 12 months. Aggressive secondary prevention.  Consider trial of PCSK9 inhibitor as an outpatient, given history of statin intolerance.   [name], MD [facility] Pager: [phone]   Recommendations  Antiplatelet/Anticoag Recommend uninterrupted dual antiplatelet therapy with Aspirin 81mg  daily and Ticagrelor 90mg  twice daily.  Discharge Date In the absence of any other complications or medical issues, we expect the patient to be ready for discharge from an interventional cardiology perspective on 07/31/2019.    EKG:  EKG is ordered today.  The ekg ordered today demonstrates NSR 62bpm, low voltage, RBBB, no change  Recent Labs: 02/10/2023: ALT 15; BUN 31; Creatinine, Ser 1.66; Potassium 4.6; Sodium 140 05/10/2023: Hemoglobin 11.1; Platelets 239  Recent Lipid Panel    Component Value Date/Time   CHOL 140 02/10/2023 0931   CHOL 258 (H) 07/23/2014 0454   TRIG 164 (H) 02/10/2023 0931   TRIG 640 (H) 07/23/2014 0454   HDL 65  02/10/2023 0931   HDL 33 (L) 07/23/2014 0454   CHOLHDL 2.2 02/10/2023 0931   VLDL 33 02/10/2023 0931   VLDL SEE COMMENT 07/23/2014 0454   LDLCALC 42 02/10/2023 0931   LDLCALC SEE COMMENT 07/23/2014 0454   LDLDIRECT 52.4 11/02/2020 0955    Physical Exam:    VS:  BP (!) 154/72   Pulse 62   Ht 5' 2.5" (1.588 m)   Wt 149 lb 12.8 oz (67.9 kg)  SpO2 96%   BMI 26.96 kg/m     Wt Readings from Last 3 Encounters:  07/05/23 149 lb 12.8 oz (67.9 kg)  10/19/22 149 lb (67.6 kg)  05/26/22 149 lb (67.6 kg)     GEN:  Well nourished, well developed in no acute distress HEENT: Normal NECK: No JVD; No carotid bruits LYMPHATICS: No lymphadenopathy CARDIAC: RRR, no murmurs, rubs, gallops RESPIRATORY:  Clear to auscultation without rales, wheezing or rhonchi  ABDOMEN: Soft, non-tender, non-distended MUSCULOSKELETAL:  No edema; No deformity  SKIN: Warm and dry NEUROLOGIC:  Alert and oriented x 3 PSYCHIATRIC:  Normal affect   ASSESSMENT:    1. Coronary artery disease involving native coronary artery of native heart without angina pectoris   2. Essential hypertension   3. Hyperlipidemia, mixed   4. AKI (acute kidney injury) (HCC)    PLAN:    In order of problems listed above:  CAD s/p RCA 07/2019 Patient denies anginal symptoms. No further ischemic work-up indicated at this time. Continue Aspirin 81mg  daily, Repatha, fenofibrate, Lopressor, and SL NTG.   HTN BP elevated and re-check 154/72. Patient says this is high for her. I recommend she check BP at home 2 hours after medications. Continue Losartan and Lopressor.  She will call in if pressures are persistently elevated.  HLD Patient has a history of negative 3 LDL on Repatha, so she was switched to Praluent. Insurance made it complicated to continue Praluent, so she went back to Repatha. LDL 42. Continue fenofibrate.  I will ask Dr. Rennis Golden if we need to space out Repatha  injections.   AKI Most recent labs showed Scr 1.66, which  was up from 1.13. Recheck BMET today.  Disposition: Follow up in 1 year(s) with MD/APP   Signed, Shams Fill David Stall, PA-C  07/05/2023 3:43 PM    [facility]

## 2023-07-06 ENCOUNTER — Telehealth: Payer: Self-pay

## 2023-07-06 NOTE — Telephone Encounter (Signed)
Pt made aware and verbalized understanding.    From: Chrystie Nose, MD  Sent: 07/06/2023  10:42 AM EDT  To: Marianne Sofia, PA-C   Her cholesterol looks fantastic - as it has been explained to her before, there is no issue with very low LDL cholesterol - negative numbers are just calculated - there is no evidence that is the case at this point - it is not advised to space out the doses, since the response would be less effective and it was not approved for that type of dosing.  I would not make any changes - please reassure.   Dr. Rennis Golden  ----- Message -----  From: Marianne Sofia, PA-C  Sent: 07/05/2023   4:25 PM EDT  To: Chrystie Nose, MD   H/o LDL negative 3 on Repatha. Insurance didn't cover praluent. Recent LDL 42. Should we space out repatha injections> Patient was concerned.

## 2023-07-12 ENCOUNTER — Other Ambulatory Visit
Admission: RE | Admit: 2023-07-12 | Discharge: 2023-07-12 | Disposition: A | Discharge status: Home / Self Care | Payer: Medicare HMO | Source: Ambulatory Visit | Attending: Medical | Admitting: Medical

## 2023-07-12 DIAGNOSIS — N179 Acute kidney failure, unspecified: Secondary | ICD-10-CM | POA: Diagnosis not present

## 2023-07-12 LAB — BASIC METABOLIC PANEL
Anion gap: 7 (ref 5–15)
BUN: 30 mg/dL — ABNORMAL HIGH (ref 8–23)
CO2: 24 mmol/L (ref 22–32)
Calcium: 9.7 mg/dL (ref 8.9–10.3)
Chloride: 105 mmol/L (ref 98–111)
Creatinine, Ser: 1.78 mg/dL — ABNORMAL HIGH (ref 0.44–1.00)
GFR, Estimated: 30 mL/min — ABNORMAL LOW (ref >60)
Glucose, Bld: 171 mg/dL — ABNORMAL HIGH (ref 70–99)
Potassium: 4.6 mmol/L (ref 3.5–5.1)
Sodium: 136 mmol/L (ref 135–145)

## 2023-07-13 ENCOUNTER — Telehealth: Payer: Self-pay | Admitting: Emergency Medicine

## 2023-07-13 NOTE — Telephone Encounter (Signed)
-----   Message from Cadence David Stall sent at 07/13/2023  8:44 AM EDT ----- Donna Harper function is mildly worse. We should cut losartan in half to 50mg  daily. Re-check BMET in 2 weeks. We may need to bring patient in to discuss BP and other med options.

## 2023-07-13 NOTE — Telephone Encounter (Signed)
Called patient and informed her of the following from Cadence Furth, PA-C.   Donna Harper function is mildly worse. We should cut losartan in half to 50mg  daily. Re-check BMET in 2 weeks. We may need to bring patient in to discuss BP and other med options.   Patient states that she is currently taking Losartan 5 mg daily. Will forward to Cadence Fransico Michael, PA-C for recommendation.

## 2023-07-14 ENCOUNTER — Telehealth: Payer: Self-pay | Admitting: Internal Medicine

## 2023-07-14 NOTE — Telephone Encounter (Signed)
Nurse confirmed pt is taking losartan 50 mg daily.  Will await PA's response

## 2023-07-14 NOTE — Telephone Encounter (Signed)
Patient states she was returning call. Please advise  

## 2023-07-14 NOTE — Telephone Encounter (Signed)
Please see previous encounter

## 2023-07-17 ENCOUNTER — Other Ambulatory Visit: Payer: Self-pay

## 2023-07-17 DIAGNOSIS — N179 Acute kidney failure, unspecified: Secondary | ICD-10-CM

## 2023-07-17 MED ORDER — LOSARTAN POTASSIUM 25 MG PO TABS: 25.0000 mg | ORAL_TABLET | Freq: Every day | ORAL | 3 refills | Status: DC

## 2023-07-17 NOTE — Telephone Encounter (Signed)
Called patient, advised that lab work was ordered for 2 weeks, cut to 25 mg losartan, updated RX to pharmacy as requested by patient.   Patient verbalized understanding.

## 2023-07-19 ENCOUNTER — Telehealth: Payer: Self-pay | Admitting: Cardiovascular Disease

## 2023-07-19 NOTE — Telephone Encounter (Signed)
Left voice mail to schedule appt

## 2023-07-28 ENCOUNTER — Other Ambulatory Visit: Payer: Self-pay | Admitting: Internal Medicine

## 2023-07-31 DIAGNOSIS — E7800 Pure hypercholesterolemia, unspecified: Secondary | ICD-10-CM | POA: Diagnosis not present

## 2023-07-31 DIAGNOSIS — R0609 Other forms of dyspnea: Secondary | ICD-10-CM | POA: Diagnosis not present

## 2023-07-31 DIAGNOSIS — R053 Chronic cough: Secondary | ICD-10-CM | POA: Diagnosis not present

## 2023-07-31 DIAGNOSIS — I2510 Atherosclerotic heart disease of native coronary artery without angina pectoris: Secondary | ICD-10-CM | POA: Diagnosis not present

## 2023-07-31 DIAGNOSIS — E1165 Type 2 diabetes mellitus with hyperglycemia: Secondary | ICD-10-CM | POA: Diagnosis not present

## 2023-08-07 ENCOUNTER — Telehealth: Payer: Self-pay | Admitting: Internal Medicine

## 2023-08-07 DIAGNOSIS — D631 Anemia in chronic kidney disease: Secondary | ICD-10-CM | POA: Diagnosis not present

## 2023-08-07 DIAGNOSIS — N1832 Chronic kidney disease, stage 3b: Secondary | ICD-10-CM | POA: Diagnosis not present

## 2023-08-07 DIAGNOSIS — Z0000 Encounter for general adult medical examination without abnormal findings: Secondary | ICD-10-CM | POA: Diagnosis not present

## 2023-08-07 DIAGNOSIS — E1122 Type 2 diabetes mellitus with diabetic chronic kidney disease: Secondary | ICD-10-CM | POA: Diagnosis not present

## 2023-08-07 DIAGNOSIS — Z13820 Encounter for screening for osteoporosis: Secondary | ICD-10-CM | POA: Diagnosis not present

## 2023-08-07 DIAGNOSIS — Z23 Encounter for immunization: Secondary | ICD-10-CM | POA: Diagnosis not present

## 2023-08-07 MED ORDER — FENOFIBRATE 54 MG PO TABS: 54.0000 mg | ORAL_TABLET | Freq: Every day | ORAL | 1 refills | Status: DC

## 2023-08-07 NOTE — Telephone Encounter (Signed)
Called patient, advised of message from MD.   Patient verbalized understanding, thankful for call back.  

## 2023-08-07 NOTE — Telephone Encounter (Signed)
Please let Donna Harper know that I have reviewed her recent outside labs, which are stable and show excellent cholesterol control.  Her kidney function and blood counts are similar to prior tests.  I recommend that she continue her current medications and follow-up as previously arranged.  [name], MD [facility]

## 2023-08-07 NOTE — Telephone Encounter (Signed)
  The patient mentioned that she recently had lab work done at her primary care physician's office and would like to know if Dr. Okey Dupre could review it to see if it meets his requirements. Additionally, she would like to enroll for patient assistance for her Repatha

## 2023-08-07 NOTE — Telephone Encounter (Signed)
Called patient, LVM advising that I would send a message over to Dr.End (lab work is in [employer]) as well as a message over to our pharmacy team to assist with the Repatha.   Thanks!

## 2023-08-07 NOTE — Telephone Encounter (Signed)
*  STAT* If patient is at the pharmacy, call can be transferred to refill team.   1. Which medications need to be refilled? (please list name of each medication and dose if known)   fenofibrate 54 MG tablet    4. Which pharmacy/location (including street and city if local pharmacy) is medication to be sent to? [id number] - [place], [place] - [address] AT [facility] OF [address]     5. Do they need a 30 day or 90 day supply? 90 Pt states she never got 90 tablets

## 2023-08-08 NOTE — Telephone Encounter (Signed)
There is currently no pt assistance available for Repatha, pt aware. Will add her to our list for [facility] grant whenever it opens again.

## 2023-08-09 ENCOUNTER — Encounter: Payer: Self-pay | Admitting: Pharmacist

## 2023-08-09 MED ORDER — REPATHA SURECLICK 140 MG/ML ~~LOC~~ SOAJ: 140.0000 mg | SUBCUTANEOUS | 3 refills | Status: DC

## 2023-08-09 NOTE — Addendum Note (Signed)
Addended by: Tekisha Darcey E on: 08/09/2023 11:59 AM   Modules accepted: Orders

## 2023-08-09 NOTE — Telephone Encounter (Signed)
Called pt to advise her that [facility] just reopened today and can re-enroll her so that her Repatha is free again. Updated grant info sent to her via mychart message and is noted below. She was appreciative for the assistance.  CARD # [id number]   BIN [id number]   PCN [id number]   GROUP [id number]

## 2023-08-16 DIAGNOSIS — M8588 Other specified disorders of bone density and structure, other site: Secondary | ICD-10-CM | POA: Diagnosis not present

## 2023-09-28 ENCOUNTER — Ambulatory Visit: Payer: Medicare HMO | Attending: Internal Medicine | Admitting: Internal Medicine

## 2023-09-28 ENCOUNTER — Encounter: Payer: Self-pay | Admitting: Internal Medicine

## 2023-09-28 VITALS — BP 140/70 | HR 62 | Ht 62.0 in | Wt 152.2 lb

## 2023-09-28 DIAGNOSIS — G720 Drug-induced myopathy: Secondary | ICD-10-CM | POA: Diagnosis not present

## 2023-09-28 DIAGNOSIS — I10 Essential (primary) hypertension: Secondary | ICD-10-CM

## 2023-09-28 DIAGNOSIS — E1169 Type 2 diabetes mellitus with other specified complication: Secondary | ICD-10-CM

## 2023-09-28 DIAGNOSIS — R0602 Shortness of breath: Secondary | ICD-10-CM

## 2023-09-28 DIAGNOSIS — T466X5D Adverse effect of antihyperlipidemic and antiarteriosclerotic drugs, subsequent encounter: Secondary | ICD-10-CM

## 2023-09-28 DIAGNOSIS — I2510 Atherosclerotic heart disease of native coronary artery without angina pectoris: Secondary | ICD-10-CM | POA: Diagnosis not present

## 2023-09-28 DIAGNOSIS — N1832 Chronic kidney disease, stage 3b: Secondary | ICD-10-CM

## 2023-09-28 DIAGNOSIS — E785 Hyperlipidemia, unspecified: Secondary | ICD-10-CM

## 2023-09-28 MED ORDER — AMLODIPINE BESYLATE 5 MG PO TABS
5.0000 mg | ORAL_TABLET | Freq: Every day | ORAL | 3 refills | Status: DC
Start: 2023-09-28 — End: 2024-07-16

## 2023-09-28 NOTE — Progress Notes (Signed)
Cardiology Office Note:  .   Date:  09/28/2023  ID:  Donna Harper, DOB 1951/09/15, MRN [id number] PCP: Barbette Reichmann, MD  [facility] Providers Cardiologist:  [name], MD     History of Present Illness: .   Discussed the use of AI scribe software for clinical note transcription with the patient, who gave verbal consent to proceed.  Donna Harper is a 72 y.o. female with history of CAD status post PCI to the RCA in setting of NSTEMI (07/2019), HTN, HLD, DM2, TIA, and GERD who presents for follow-up of coronary artery disease and hyperlipidemia.  She was last seen in our office in July by Terrilee Croak, [place], at which time she was doing well other than some mild shortness of breath in the setting of recent pulmonary infection.  Blood pressure was mildly elevated.  Medication changes were deferred in favor of home monitoring.  Today, Donna Harper reports that she has been dealing with new wheezing and shortness of breath, which she was told by Dr. Marcello Fennel is due to asthma. She has a history of childhood asthma but had not had any issues with this for decades. She is currently on inhalers prescribed by Dr. Marcello Fennel. The patient also reports intermittent palpitations but denies any associated lightheadedness or dizziness. She does not regularly monitor her blood pressure at home, but it was noted to be elevated during the current visit.  The patient's kidney function has been declining over the past few years, which her primary care provider attributes to long-standing diabetes. She denies the use of NSAIDs. The patient also reports hip pain, suspected to be bursitis, which has previously been treated with injections. This pain currently limits her ability to exercise. She remains compliant with her medications, including Repatha, which she is tolerating well.   Echocardiogram was ordered by Dr. Janee Morn day in July and showed normal LVEF without significant valvular abnormalities.    ROS:  See HPI  Studies Reviewed: [name]   EKG Interpretation Date/Time:  Thursday September 28 2023 11:07:38 EDT Ventricular Rate:  64 PR Interval:  180 QRS Duration:  122 QT Interval:  432 QTC Calculation: 445 R Axis:   49  Text Interpretation: Normal sinus rhythm Right bundle branch block Abnormal ECG When compared with ECG of 28-Sep-2023 11:05, No significant change was found Confirmed by Tong[place] Prout, Cristal Deer [phone]) on 09/28/2023 11:13:39 AM    TTE (06/21/2023, [facility]): Normal LV size and function.  LVEF greater than 55%.  Mild aortic regurgitation and stenosis (mean gradient reported is 9 mmHg).  Mitral annular calcification with trivial MR noted.  Risk Assessment/Calculations:           Physical Exam:   VS:  BP (!) 140/70 (BP Location: Left Arm, Patient Position: Sitting, Cuff Size: Normal)   Pulse 62   Ht 5\' 2"  (1.575 m)   Wt 152 lb 4 oz (69.1 kg)   SpO2 98%   BMI 27.85 kg/m    Wt Readings from Last 3 Encounters:  09/28/23 152 lb 4 oz (69.1 kg)  07/05/23 149 lb 12.8 oz (67.9 kg)  10/19/22 149 lb (67.6 kg)    General:  NAD. Neck: No JVD or HJR. Lungs: Clear to auscultation bilaterally without wheezes or crackles. Heart: Regular rate and rhythm with 2/6 systolic murmur. Abdomen: Soft, nontender, nondistended. Extremities: No lower extremity edema.  ASSESSMENT AND PLAN: .    Coronary artery disease and shortness of breath: Donna Harper does not report any significant chest pain  though she has been experiencing shortness of breath over the last few months that her new PCP attributes to asthma.  Echocardiogram in July showed preserved LVEF without significant valvular abnormalities to explain her dyspnea.  Given her history of CAD and prior PCI, dyspnea is an anginal equivalent is possible.  I will defer further testing today.  However, if further workup and management of reported asthma does not improve her symptoms, we will have a low threshold for ischemia evaluation.  Continue  secondary prevention with aspirin and evolocumab.  Hypertension: Blood pressure mildly elevated today.  We have agreed to add amlodipine 5 mg daily.  Defer escalation of losartan, as dose escalation in the past has led to worsening renal insufficiency.  If renal function continues to decline, it may be useful to obtain a renal artery Doppler to exclude significant renal artery stenosis.  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  Ongoing management of DM per Dr. Marcello Fennel.  Chronic kidney disease stage 3b: Most recent labs in August notable for creatinine 1.6 and GFR of 34.  Renal function has been relatively stable with some slight fluctuations over the last 1.5 years.  However, it is noticeably worse compared to 2021.  Worsening renal insufficiency has been attributed to diabetic nephropathy.  However, would have a low threshold for obtaining renal artery Doppler to exclude renal artery stenosis, particularly if renal function continues to decline or blood pressure remains suboptimally controlled.    Dispo: Return to clinic in 3 months.  Signed, [name], MD

## 2023-09-28 NOTE — Patient Instructions (Signed)
Medication Instructions:  Your physician recommends the following medication changes.  START TAKING: Amlodipine 5 mg by mouth daily    *If you need a refill on your cardiac medications before your next appointment, please call your pharmacy*   Lab Work: No labs ordered today    Testing/Procedures: No test ordered today    Follow-Up: At [facility], you and your health needs are our priority.  As part of our continuing mission to provide you with exceptional heart care, we have created designated Provider Care Teams.  These Care Teams include your primary Cardiologist (physician) and Advanced Practice Providers (APPs -  Physician Assistants and Nurse Practitioners) who all work together to provide you with the care you need, when you need it.  We recommend signing up for the patient portal called "MyChart".  Sign up information is provided on this After Visit Summary.  MyChart is used to connect with patients for Virtual Visits (Telemedicine).  Patients are able to view lab/test results, encounter notes, upcoming appointments, etc.  Non-urgent messages can be sent to your provider as well.   To learn more about what you can do with MyChart, go to [email or web address].    Your next appointment:   3 month(s)  Provider:   You may see [name], MD or one of the following Advanced Practice Providers on your designated Care Team:   Nicolasa Ducking, NP Eula Listen, PA-C Cadence Fransico Michael, PA-C Charlsie Quest, NP

## 2023-09-29 ENCOUNTER — Encounter: Payer: Self-pay | Admitting: Internal Medicine

## 2023-09-29 DIAGNOSIS — N1832 Chronic kidney disease, stage 3b: Secondary | ICD-10-CM | POA: Insufficient documentation

## 2023-10-19 DIAGNOSIS — M65312 Trigger thumb, left thumb: Secondary | ICD-10-CM | POA: Diagnosis not present

## 2023-11-07 ENCOUNTER — Other Ambulatory Visit: Payer: Self-pay | Admitting: Internal Medicine

## 2023-11-07 NOTE — Telephone Encounter (Signed)
Last visit 09/28/23 with plan to f/u in 3 months.    Next visit:  01/04/24

## 2023-11-21 DIAGNOSIS — I700 Atherosclerosis of aorta: Secondary | ICD-10-CM | POA: Diagnosis not present

## 2023-11-21 DIAGNOSIS — Z13820 Encounter for screening for osteoporosis: Secondary | ICD-10-CM | POA: Diagnosis not present

## 2023-11-21 DIAGNOSIS — N1832 Chronic kidney disease, stage 3b: Secondary | ICD-10-CM | POA: Diagnosis not present

## 2023-11-21 DIAGNOSIS — D649 Anemia, unspecified: Secondary | ICD-10-CM | POA: Diagnosis not present

## 2023-11-21 DIAGNOSIS — E7800 Pure hypercholesterolemia, unspecified: Secondary | ICD-10-CM | POA: Diagnosis not present

## 2023-11-21 DIAGNOSIS — E1165 Type 2 diabetes mellitus with hyperglycemia: Secondary | ICD-10-CM | POA: Diagnosis not present

## 2023-11-21 DIAGNOSIS — Z87442 Personal history of urinary calculi: Secondary | ICD-10-CM | POA: Diagnosis not present

## 2023-11-22 DIAGNOSIS — R8290 Unspecified abnormal findings in urine: Secondary | ICD-10-CM | POA: Diagnosis not present

## 2023-11-23 DIAGNOSIS — M65312 Trigger thumb, left thumb: Secondary | ICD-10-CM | POA: Diagnosis not present

## 2023-11-24 IMAGING — CT CT ABD-PEL WO/W CM
3 of 12 series · 9 of 46 positions shown, 16 images · IV contrast (APPLIED)
Comparison: None Available.

CLINICAL DATA: Gross hematuria, suspected kidney stone

EXAM:
CT ABDOMEN AND PELVIS WITHOUT AND WITH CONTRAST
TECHNIQUE: Multidetector CT imaging of the abdomen and pelvis was performed
following the standard protocol before and following the bolus
administration of intravenous contrast.

[Series 4: coronal pre · coronal · non-contrast · 0.76mm/px · 2 of 90 slices shown, 3 images]
[im 30/90  soft-tissue]
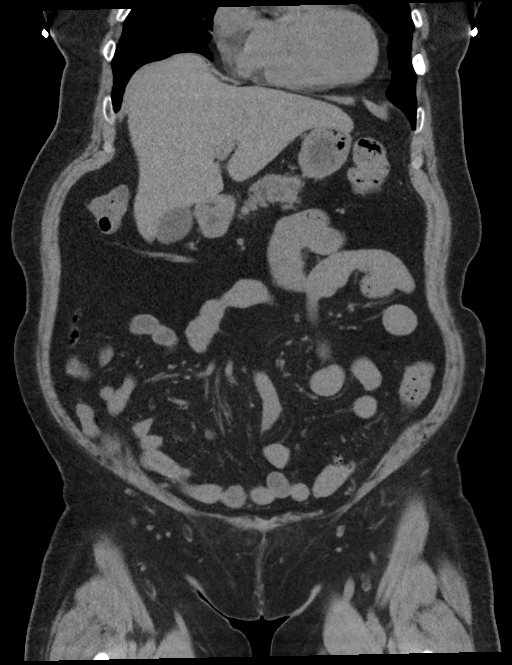
[im 30/90  bone]
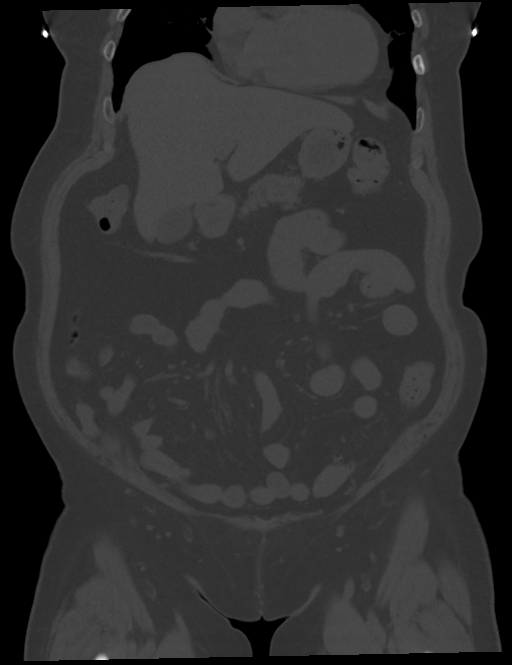
[im 60/90  soft-tissue]
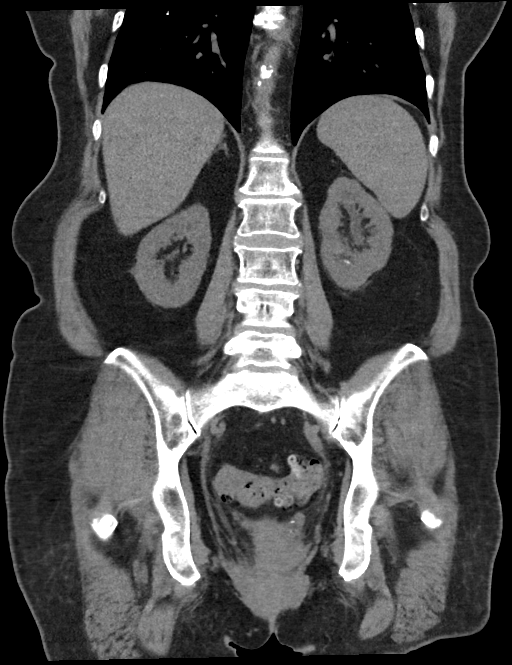

[Series 7: sagittal pre · sagittal · non-contrast · 0.55mm/px · 1 of 127 slices shown, 2 images]
[im 80/127  soft-tissue]
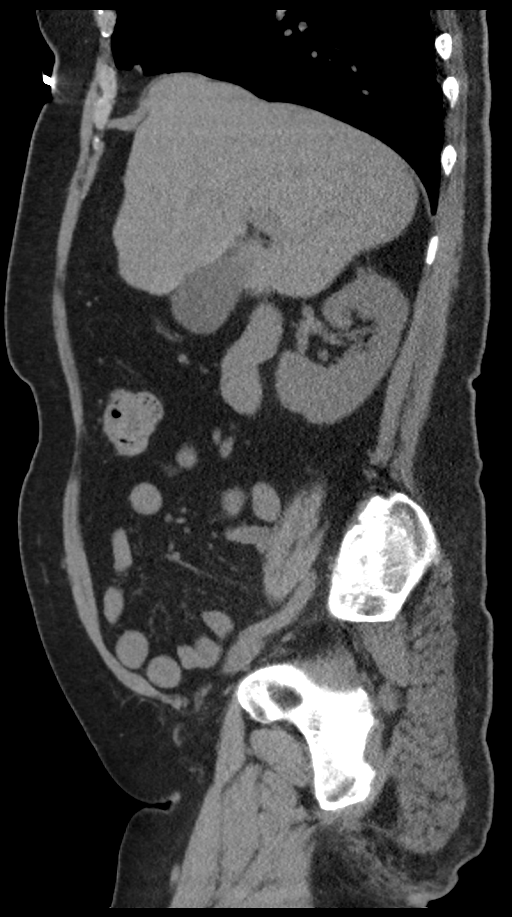
[im 80/127  bone]
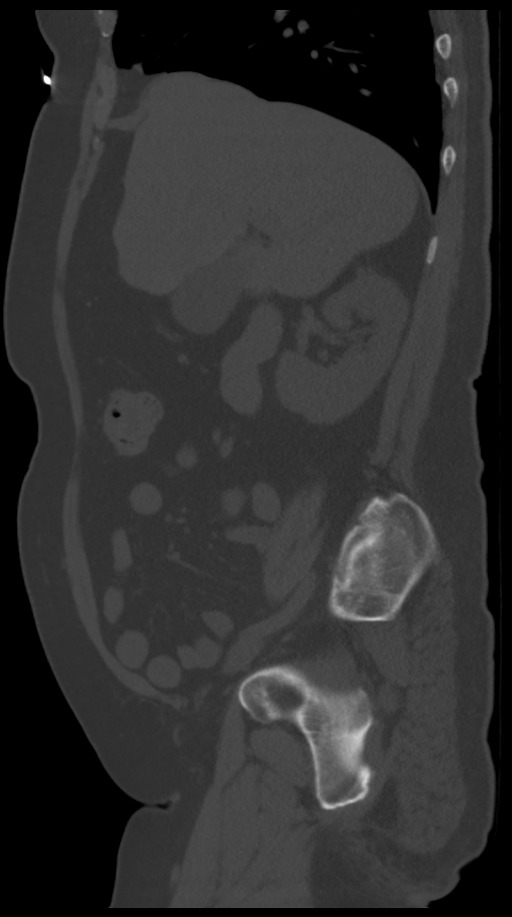

[Series 13: axial delay · axial · delayed · 0.82mm/px · z∈[-875,-505]mm · 6 of 104 slices shown, 11 images]
[im 15/104  soft-tissue]
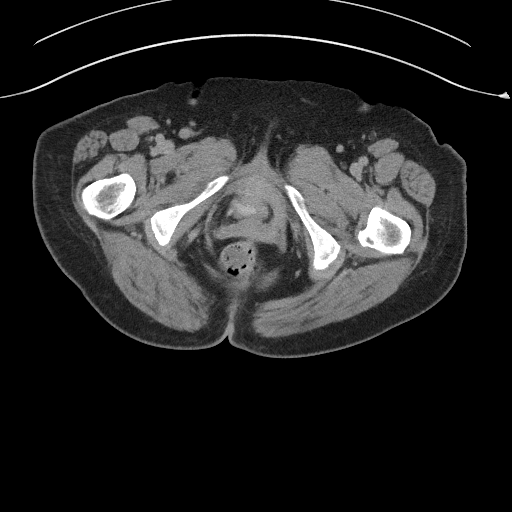
[im 15/104  bone]
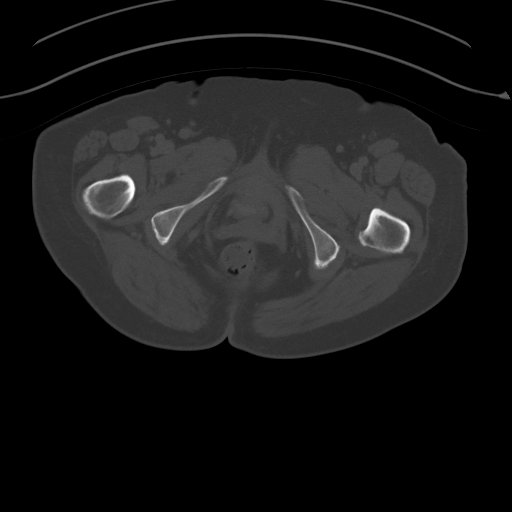
[im 30/104  soft-tissue]
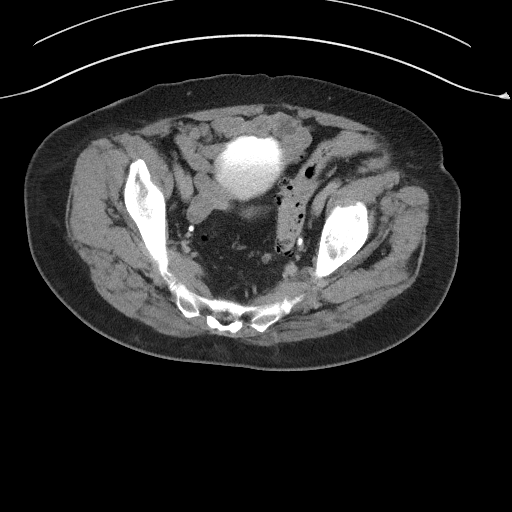
[im 45/104  soft-tissue]
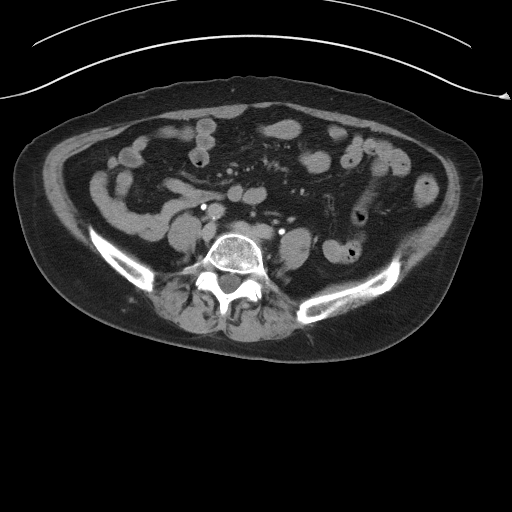
[im 45/104  lung]
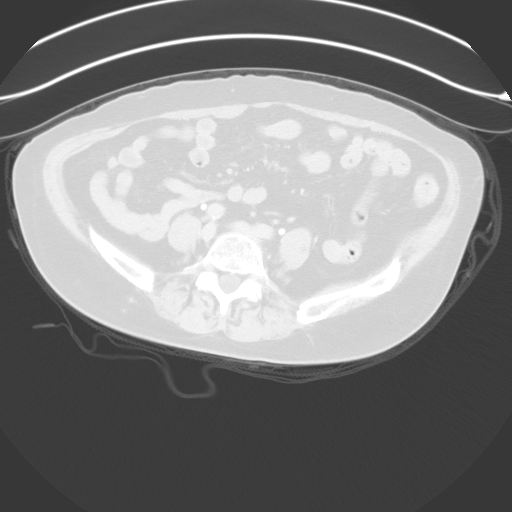
[im 59/104  soft-tissue]
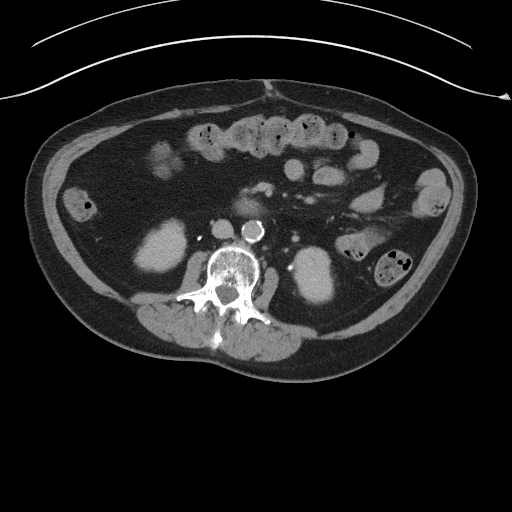
[im 59/104  lung]
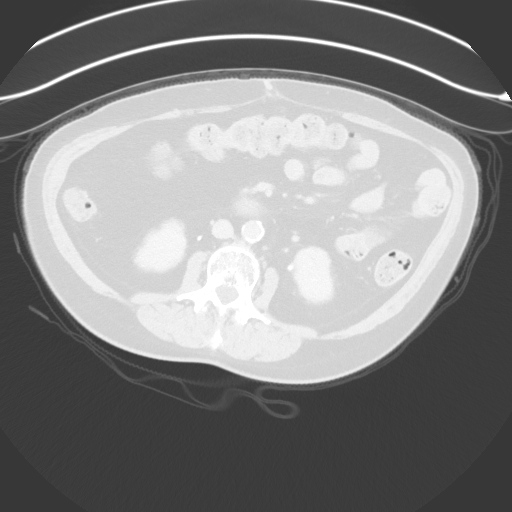
[im 74/104  soft-tissue]
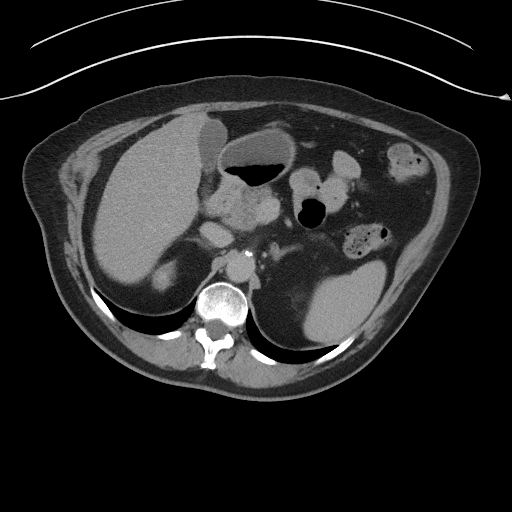
[im 74/104  lung]
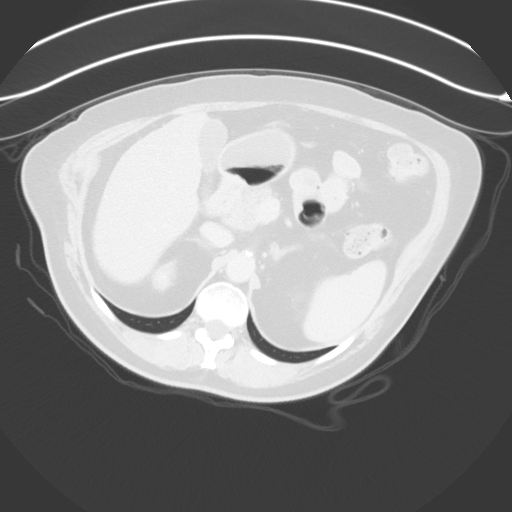
[im 89/104  soft-tissue]
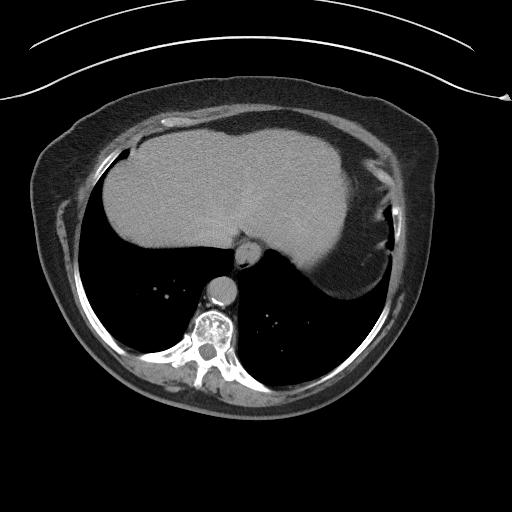
[im 89/104  lung]
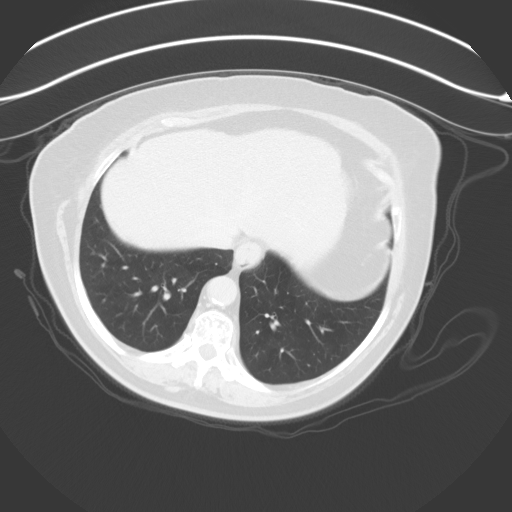

[9 of 46 positions shown; findings below may reference images not displayed]

RADIATION DOSE REDUCTION: This exam was performed according to the
departmental dose-optimization program which includes automated
exposure control, adjustment of the mA and/or kV according to
patient size and/or use of iterative reconstruction technique.

CONTRAST:  80mL OMNIPAQUE IOHEXOL 300 MG/ML  SOLN
FINDINGS: Lower chest: No acute abnormality. Coronary artery calcifications.
Small hiatal hernia.

Hepatobiliary: No solid liver abnormality is seen. No gallstones,
gallbladder wall thickening, or biliary dilatation.

Pancreas: Unremarkable. No pancreatic ductal dilatation or
surrounding inflammatory changes.

Spleen: Normal in size without significant abnormality.

Adrenals/Urinary Tract: Adrenal glands are unremarkable. Multiple
nonobstructive calculi of the inferior pole of the left kidney. No
right-sided calculi, ureteral calculi, or hydronephrosis. No urinary
tract filling defect on delayed phase imaging. Pelvic floor prolapse
with cystocele (series 7, image 69). Bladder is otherwise
unremarkable.

Stomach/Bowel: Stomach is within normal limits. Appendix is not
clearly visualized and may be diminutive or surgically absent. No
evidence of bowel wall thickening, distention, or inflammatory
changes. Sigmoid diverticula.

Vascular/Lymphatic: Aortic atherosclerosis. No enlarged abdominal or
pelvic lymph nodes.

Reproductive: No mass or other significant abnormality.

Other: No abdominal wall hernia or abnormality. No ascites.

Musculoskeletal: No acute or significant osseous findings.
IMPRESSION: 1. Multiple nonobstructive calculi of the inferior pole of the left
kidney. No right-sided calculi, ureteral calculi, or hydronephrosis.
2. No urinary tract mass or filling defect on delayed phase imaging.
3. Pelvic floor prolapse with cystocele.
4. Sigmoid diverticulosis.
5. Coronary artery disease.

Aortic Atherosclerosis ([id number]).

## 2024-01-03 NOTE — Progress Notes (Signed)
Cardiology Office Note:  .   Date:  01/04/2024  ID:  Donna Harper, DOB 25-Oct-1951, MRN [id number] PCP: Barbette Reichmann, MD  [facility] Providers Cardiologist:  [name], MD     History of Present Illness: Donna Harper is a 73 y.o. female with history of CAD status post PCI to the RCA in setting of NSTEMI (07/2019), HTN, HLD, DM2, TIA, and GERD, who presents for follow-up of coronary artery disease.  I last saw her in October, at which time she noted wheezing and dyspnea attributed to asthma by Dr. Marcello Fennel.  She denied chest pain.  Echocardiogram in 06/2023 at [facility] showed normal LVEF with mild aortic regurgitation and borderline aortic stenosis (mean gradient 9 mmHg).  We discussed ischemia testing but agreed to defer this pending further optimization of her asthma therapy.  Due to suboptimal blood pressure control, we added amlodipine 5 mg daily.  Today, Donna Harper reports that she has been bothered by intense leg cramps at night over the last year.  She noted some initial response when being started on magnesium supplementation by Dr. Marcello Fennel, though cramps seem to have worsened again.  She also reports a chronic cough associated with sinus congestion.  She feels like she has a "bad cold" that has not improved over the last several months.  She denies dyspnea initially though later states that her "asthma and wheezing" are no better despite having been placed on Symbicort and as needed albuterol.  She has used Nasacort in the past for sinus congestion but has not used this in several months.  She wonders if some of her symptoms, particularly the leg cramps, could be due to Repatha.  She has not had any chest pain, palpitations, lightheadedness, or edema.  ROS: See HPI  Studies Reviewed: [name]   EKG Interpretation Date/Time:  Thursday January 04 2024 11:02:01 EST Ventricular Rate:  73 PR Interval:  176 QRS Duration:  124 QT Interval:  418 QTC Calculation: 460 R  Axis:   48  Text Interpretation: Normal sinus rhythm Right bundle branch block Abnormal ECG When compared with ECG of 28-Sep-2023 11:07, No significant change was found Confirmed by Caydyn Sprung, Cristal Deer [phone]) on 01/04/2024 11:04:32 AM    TTE (06/21/2023, [facility]): Normal LV size and function. LVEF greater than 55%. Mild aortic regurgitation and stenosis (mean gradient reported is 9 mmHg). Mitral annular calcification with trivial MR noted.   Risk Assessment/Calculations:             Physical Exam:   VS:  BP 115/73 (BP Location: Left Arm, Patient Position: Sitting, Cuff Size: Normal)   Pulse 73   Ht 5\' 2"  (1.575 m)   Wt 152 lb 3.2 oz (69 kg)   SpO2 98%   BMI 27.84 kg/m    Wt Readings from Last 3 Encounters:  01/04/24 152 lb 3.2 oz (69 kg)  09/28/23 152 lb 4 oz (69.1 kg)  07/05/23 149 lb 12.8 oz (67.9 kg)    General:  NAD. Neck: No JVD or HJR. Lungs: Clear to auscultation bilaterally without wheezes or crackles. Heart: Regular rate and rhythm without murmurs, rubs, or gallops. Abdomen: Soft, nontender, nondistended. Extremities: No lower extremity edema.  ASSESSMENT AND PLAN: .    Coronary artery disease, leg cramps, and hyperlipidemia associated with type 2 diabetes mellitus: Donna Harper has not had any chest pain or neck/jaw pain (anginal equivalent at the time of her MI).  We will plan to continue aspirin 81  mg daily as well as antianginal/blood pressure therapy with amlodipine and metoprolol.  Though I have a low suspicion that Repatha is contributing to her nocturnal leg cramps, we have agreed to embark on a 1 month holiday.  I have asked her to contact us in a month to update [place] on her response.  If her leg cramps improved, we may need to consider transitioning to Praluent or having her see the lipid clinic again to discuss other options.  Cough: This has been present for several months and associated with sinus congestion.  I wonder if she has a degree of postnasal drip.   She was on nasal steroid in the past with some relief.  I have asked her to begin taking this again.  I have also recommended a an empiric trial of famotidine 20 mg twice daily for possible silent reflux's.  She should follow-up with Dr. Marcello Fennel for further evaluation as well as discuss pulmonary consultation if symptoms do not improve.  Hypertension: BP well-controlled today.  Continue current antihypertensive regimen.    Dispo: Return to clinic in 6 months.  Signed, [name], MD

## 2024-01-04 ENCOUNTER — Ambulatory Visit: Payer: Medicare HMO | Attending: Internal Medicine | Admitting: Internal Medicine

## 2024-01-04 ENCOUNTER — Encounter: Payer: Self-pay | Admitting: Internal Medicine

## 2024-01-04 VITALS — BP 115/73 | HR 73 | Ht 62.0 in | Wt 152.2 lb

## 2024-01-04 DIAGNOSIS — I10 Essential (primary) hypertension: Secondary | ICD-10-CM

## 2024-01-04 DIAGNOSIS — E1169 Type 2 diabetes mellitus with other specified complication: Secondary | ICD-10-CM | POA: Diagnosis not present

## 2024-01-04 DIAGNOSIS — I2510 Atherosclerotic heart disease of native coronary artery without angina pectoris: Secondary | ICD-10-CM

## 2024-01-04 DIAGNOSIS — R252 Cramp and spasm: Secondary | ICD-10-CM | POA: Diagnosis not present

## 2024-01-04 DIAGNOSIS — R053 Chronic cough: Secondary | ICD-10-CM | POA: Diagnosis not present

## 2024-01-04 DIAGNOSIS — E785 Hyperlipidemia, unspecified: Secondary | ICD-10-CM

## 2024-01-04 DIAGNOSIS — I25118 Atherosclerotic heart disease of native coronary artery with other forms of angina pectoris: Secondary | ICD-10-CM | POA: Diagnosis not present

## 2024-01-04 MED ORDER — FAMOTIDINE 20 MG PO TABS: 20.0000 mg | ORAL_TABLET | Freq: Two times a day (BID) | ORAL | Status: AC

## 2024-01-04 MED ORDER — MOMETASONE FUROATE 50 MCG/ACT NA SUSP: 2.0000 | Freq: Every day | NASAL | Status: AC | PRN

## 2024-01-04 NOTE — Patient Instructions (Addendum)
Medication Instructions:  Your physician recommends the following medication changes.  Hold: Repatha for one month, then call the office with an update regarding your symptoms.  START TAKING: Famotidine (Pepcid) 20 mg by mouth twice a day (Can purchase over the counter) Nasonex 2 spray daily as needed (Can purchase over the counter)  *If you need a refill on your cardiac medications before your next appointment, please call your pharmacy*   Lab Work: No labs ordered today    Testing/Procedures: No test ordered today    Follow-Up: At [facility], you and your health needs are our priority.  As part of our continuing mission to provide you with exceptional heart care, we have created designated Provider Care Teams.  These Care Teams include your primary Cardiologist (physician) and Advanced Practice Providers (APPs -  Physician Assistants and Nurse Practitioners) who all work together to provide you with the care you need, when you need it.  We recommend signing up for the patient portal called "MyChart".  Sign up information is provided on this After Visit Summary.  MyChart is used to connect with patients for Virtual Visits (Telemedicine).  Patients are able to view lab/test results, encounter notes, upcoming appointments, etc.  Non-urgent messages can be sent to your provider as well.   To learn more about what you can do with MyChart, go to [email or web address].    Your next appointment:   6 month(s)  Provider:   You may see [name], MD or one of the following Advanced Practice Providers on your designated Care Team:   Nicolasa Ducking, NP Eula Listen, PA-C Cadence Fransico Michael, PA-C Charlsie Quest, NP Carlos Levering, NP    Dr. Okey Dupre recommends reaching out to your primary care regarding pulmonology referral.

## 2024-01-06 ENCOUNTER — Encounter: Payer: Self-pay | Admitting: Internal Medicine

## 2024-01-23 ENCOUNTER — Telehealth: Payer: Self-pay | Admitting: Internal Medicine

## 2024-01-23 ENCOUNTER — Encounter: Payer: Self-pay | Admitting: Internal Medicine

## 2024-01-23 ENCOUNTER — Ambulatory Visit: Payer: Medicare HMO | Attending: Cardiovascular Disease | Admitting: Internal Medicine

## 2024-01-23 VITALS — Ht 62.0 in | Wt 146.0 lb

## 2024-01-23 DIAGNOSIS — T466X5A Adverse effect of antihyperlipidemic and antiarteriosclerotic drugs, initial encounter: Secondary | ICD-10-CM | POA: Diagnosis not present

## 2024-01-23 DIAGNOSIS — E785 Hyperlipidemia, unspecified: Secondary | ICD-10-CM | POA: Diagnosis not present

## 2024-01-23 DIAGNOSIS — M791 Myalgia, unspecified site: Secondary | ICD-10-CM | POA: Diagnosis not present

## 2024-01-23 DIAGNOSIS — I2510 Atherosclerotic heart disease of native coronary artery without angina pectoris: Secondary | ICD-10-CM

## 2024-01-23 NOTE — Progress Notes (Signed)
Virtual Visit via Video Note   This visit type was conducted due to national recommendations for restrictions regarding the COVID-19 Pandemic (e.g. social distancing) in an effort to limit this patient's exposure and mitigate transmission in our community.  Due to her co-morbid illnesses, this patient is at least at moderate risk for complications without adequate follow up.  This format is felt to be most appropriate for this patient at this time.  The patient does have access to video technology.  All issues noted in this document were discussed and addressed.  A limited physical exam could be performed with this format.  Please refer to the patient's chart for her  consent to telehealth for [facility].   Evaluation Performed: Video visit  Date:  01/23/2024   ID:  MCKAYLEE DIMALANTA, DOB September 01, 1951, MRN [id number]  Patient Location:  [address] [name] [facility] [address]  Provider location:   [address] [place], [place] [address]  PCP:  Barbette Reichmann, MD  Cardiologist:  [name], MD Electrophysiologist:  None   Chief Complaint: Follow-up dyslipidemia  History of Present Illness:    ROSIA Harper is a 73 y.o. female who presents via audio/video conferencing for a telehealth visit today.  This is a pleasant 73 year old female kindly referred by Dr. and for management of dyslipidemia.  She was found to have coronary artery disease during recent cardiac catheterization which showed an EF of 45 to 50% by LV gram however was 55 to 60% by echo.  She was found to have multivessel coronary disease and received a stent to the mid RCA.  Aggressive medical therapy was recommended.  Recently labs show total cholesterol 264, triglycerides 372, HDL 47 and LDL 143.  Unfortunately she has a history of statin intolerance causing joint pain and myalgias.  She has been intolerant to atorvastatin, rosuvastatin, pravastatin and others.  She was seen in follow-up yesterday by Christain Sacramento, NP, who recommended starting Repatha.  This was submitted however she was notified by pharmacy that the cost would be about $500/month.  Subsequently it was noted that Praluent is the preferred agent for her insurance and a new prescription was sent in today.  We have discussed options with her if the cost remains elevated including options for patient assistance was we provided her for.  She will need to fill out those applications and submit it.  Cost may also be affected by the insurance plan, deductible, whether she is in the donut hole, etc.  She may wish to consider an alternative healthcare plan during open enrollment this fall.  If she were to be unable to take a PCSK9 inhibitor, there are other options which we can consider and I would like to continue to follow her lipid clinic for this.  03/15/2022  Donna Harper returns today for follow-up.  She is done well on Praluent.  Her last lipid profile actually showed a calculated negative LDL.  Based on that I advise decreasing her Praluent from 150 to 75 mg daily.  Her repeat lipid profile shows a total cholesterol now 122, triglycerides 139, HDL 65 and LDL 29.  Overall this represents excellent control.  She is tolerating the medicine well.  01/23/2024  Donna Harper returns today for follow-up.  She recently saw her primary cardiologist.  She had been complaining of some leg cramps.  Although he felt like it was unlikely to be Repatha as she did pause a dose.  She notes no change in her  cramps.  She then started using a warm blanket at night and noted that her cramps have gotten better.  She intends to restart her Repatha.  Labs from December were excellent.  Total cholesterol 104, triglycerides 182, HDL 61 and LDL 6.  Prior CV studies:   The following studies were reviewed today:  Chart reviewed Lab work  PMHx:  Past Medical History:  Diagnosis Date   CAD (coronary artery disease)    a. 07/2019 NSTEMI/PCI: LM 15d, LAD 40p, 50/58m, RI nl,  LCX nl, OM1 small, RCA large, 30p, 46m (3.0x12 Resolute Onyx DES), RPDA fills via L->R collats (OM1), EF 45-50%; b. 11/2020 MV: EF>65%, no ischemia/infact-->low risk.   GERD (gastroesophageal reflux disease)    Hyperlipidemia LDL goal <70    Hypertension    Ischemic cardiomyopathy    a. 07/30/2019 LV gram: EF 45-50%, basal inf HK; b. 07/31/2019 Echo: EF 55-60%, impaired relaxation, sev basal inf HK. Nl RV fxn.    Joint pain    Murmur    a. 07/2019 Echo: no significant valvular abnormalities. Mild AoV thickening.   Stroke [facility])    TIA (transient ischemic attack) 08/2014   Type II diabetes mellitus (HCC)     Past Surgical History:  Procedure Laterality Date   BREAST BIOPSY Bilateral 1988   benign/Dr Sankar   CARDIAC CATHETERIZATION     CESAREAN SECTION  1986   COLONOSCOPY WITH PROPOFOL N/A 02/01/2022   Procedure: COLONOSCOPY WITH PROPOFOL;  Surgeon: Midge Minium, MD;  Location: [facility] ENDOSCOPY;  Service: Endoscopy;  Laterality: N/A;   CORONARY STENT INTERVENTION N/A 07/30/2019   Procedure: CORONARY STENT INTERVENTION;  Surgeon: [name], MD;  Location: [facility] INVASIVE CV LAB;  Service: Cardiovascular;  Laterality: N/A;  RCA   LEFT HEART CATH AND CORONARY ANGIOGRAPHY N/A 07/30/2019   Procedure: LEFT HEART CATH AND CORONARY ANGIOGRAPHY;  Surgeon: [name], MD;  Location: [facility] INVASIVE CV LAB;  Service: Cardiovascular;  Laterality: N/A;    FAMHx:  Family History  Problem Relation Age of Onset   COPD Mother    COPD Father     SOCHx:   reports that she has quit smoking. She has never used smokeless tobacco. She reports that she does not drink alcohol and does not use drugs.  ALLERGIES:  Allergies  Allergen Reactions   Atorvastatin     Myalgias   Erythromycin     Stomach Problems'    Levaquin [Levofloxacin In D5w]    Penicillins     Heart races   Rosuvastatin     Myalgias   Simvastatin     Myalgias   Tequin [Gatifloxacin]    Zoloft [Sertraline Hcl]    Zithromax  [Azithromycin] Palpitations    Heart race    MEDS:  Current Meds  Medication Sig   albuterol (VENTOLIN HFA) 108 (90 Base) MCG/ACT inhaler Inhale 1 puff into the lungs every 4 (four) hours as needed.   aspirin 81 MG tablet Take 81 mg by mouth daily.   budesonide-formoterol (SYMBICORT) 80-4.5 MCG/ACT inhaler Inhale 2 puffs into the lungs in the morning and at bedtime.   cholecalciferol (VITAMIN D) 1000 UNITS tablet Take 1,000 Units by mouth daily.   clobetasol cream (TEMOVATE) 0.05 % APPLY TOPICALLY TO THE AFFECTED AREA TWICE DAILY   cyanocobalamin (VITAMIN B12) 1000 MCG tablet Take 1,000 mcg by mouth daily.   famotidine (PEPCID) 20 MG tablet Take 1 tablet (20 mg total) by mouth 2 (two) times daily.   fenofibrate 54 MG tablet Take 1  tablet (54 mg total) by mouth daily.   glipiZIDE (GLUCOTROL XL) 5 MG 24 hr tablet TAKE 1 TABLET(5 MG) BY MOUTH DAILY   iron polysaccharides (NIFEREX) 150 MG capsule Take 150 mg by mouth daily.   losartan (COZAAR) 25 MG tablet Take 1 tablet (25 mg total) by mouth daily.   magnesium oxide (MAG-OX) 400 MG tablet Take by mouth.   metFORMIN (GLUCOPHAGE) 1000 MG tablet TAKE 1 TABLET(1000 MG) BY MOUTH TWICE DAILY   metoprolol tartrate (LOPRESSOR) 50 MG tablet TAKE 1 TABLET(50 MG) BY MOUTH TWICE DAILY   mometasone (NASONEX) 50 MCG/ACT nasal spray Place 2 sprays into the nose daily as needed.   Multiple Vitamin (MULTIVITAMIN WITH MINERALS) TABS tablet Take 1 tablet by mouth daily.   nitroGLYCERIN (NITROSTAT) 0.4 MG SL tablet Place 1 tablet (0.4 mg total) under the tongue every 5 (five) minutes as needed for chest pain.   vitamin E 400 UNIT capsule Take 400 Units by mouth daily.     ROS: Pertinent items noted in HPI and remainder of comprehensive ROS otherwise negative.  Labs/Other Tests and Data Reviewed:    Recent Labs: 02/10/2023: ALT 15 05/10/2023: Hemoglobin 11.1; Platelets 239 07/12/2023: BUN 30; Creatinine, Ser 1.78; Potassium 4.6; Sodium 136   Recent Lipid  Panel Lab Results  Component Value Date/Time   CHOL 140 02/10/2023 09:31 AM   CHOL 258 (H) 07/23/2014 04:54 AM   TRIG 164 (H) 02/10/2023 09:31 AM   TRIG 640 (H) 07/23/2014 04:54 AM   HDL 65 02/10/2023 09:31 AM   HDL 33 (L) 07/23/2014 04:54 AM   CHOLHDL 2.2 02/10/2023 09:31 AM   LDLCALC 42 02/10/2023 09:31 AM   LDLCALC SEE COMMENT 07/23/2014 04:54 AM   LDLDIRECT 52.4 11/02/2020 09:55 AM    Wt Readings from Last 3 Encounters:  01/23/24 146 lb (66.2 kg)  01/04/24 152 lb 3.2 oz (69 kg)  09/28/23 152 lb 4 oz (69.1 kg)     Exam:    Vital Signs:  Ht 5\' 2"  (1.575 m)   Wt 146 lb (66.2 kg)   BMI 26.70 kg/m    General appearance: alert and no distress Lungs: No visual respiratory difficulty Abdomen: Normal weight Extremities: extremities normal, atraumatic, no cyanosis or edema Skin: Skin color, texture, turgor normal. No rashes or lesions Neurologic: Grossly normal  ASSESSMENT & PLAN:    Mixed dyslipidemia, goal LDL less than 70 Multivessel coronary artery disease with recent PCI (07/2019) Statin intolerance  Ms. Sandberg has been switched from Praluent to Repatha because of insurance reasons.  Her recent LDL was 6 in December 2024.  She had some cramping in her legs which has resolved with a heating blanket and she wishes to restart the Repatha.  I would agree with this.  Overall she is done very well with good therapy.  I think she can continue to follow-up with her primary cardiologist as the Repatha could be prescribed through their office.  I am happy to see her back on an as-needed basis.  COVID-19 Education: The signs and symptoms of COVID-19 were discussed with the patient and how to seek care for testing (follow up with PCP or arrange E-visit).  The importance of social distancing was discussed today.  Patient Risk:   After full review of this patients clinical status, I feel that they are at least moderate risk at this time.  Time:   Today, I have spent 15 minutes with  the patient with telehealth technology discussing dyslipidemia, PCSK9 inhibitors, coronary artery  disease, statin intolerance.     Medication Adjustments/Labs and Tests Ordered: Current medicines are reviewed at length with the patient today.  Concerns regarding medicines are outlined above.   Tests Ordered: No orders of the defined types were placed in this encounter.   Medication Changes: No orders of the defined types were placed in this encounter.   Disposition:  prn  Chrystie Nose, MD, Milagros Loll  [place]  [facility]  Medical Director of the Advanced Lipid Disorders &  Cardiovascular Risk Reduction Clinic Diplomate of the American Board of Clinical Lipidology Attending Cardiologist  Direct Dial: [phone]  Fax: [phone]  Website:  [email or web address]  Chrystie Nose, MD  01/23/2024 9:04 AM

## 2024-01-23 NOTE — Patient Instructions (Signed)
Medication Instructions:  RESUME Repatha every two weeks  *If you need a refill on your cardiac medications before your next appointment, please call your pharmacy*   Lab Work: AS DIRECTED by Dr. Okey Dupre or PCP for cholesterol follow up     Follow-Up: At [facility], you and your health needs are our priority.  As part of our continuing mission to provide you with exceptional heart care, we have created designated Provider Care Teams.  These Care Teams include your primary Cardiologist (physician) and Advanced Practice Providers (APPs -  Physician Assistants and Nurse Practitioners) who all work together to provide you with the care you need, when you need it.  We recommend signing up for the patient portal called "MyChart".  Sign up information is provided on this After Visit Summary.  MyChart is used to connect with patients for Virtual Visits (Telemedicine).  Patients are able to view lab/test results, encounter notes, upcoming appointments, etc.  Non-urgent messages can be sent to your provider as well.   To learn more about what you can do with MyChart, go to [email or web address].    Your next appointment:   AS NEEDED with Dr. Andi Hence routine cardiology appointments with Dr. Okey Dupre (who can refill Repatha)

## 2024-01-23 NOTE — Telephone Encounter (Signed)
  Patient Consent for Virtual Visit        [name] has provided verbal consent on 01/23/2024 for a virtual visit (video or telephone).   CONSENT FOR VIRTUAL VISIT FOR:  Donna Harper  By participating in this virtual visit I agree to the following:  I hereby voluntarily request, consent and authorize [facility] and its employed or contracted physicians, physician assistants, nurse practitioners or other licensed health care professionals (the Practitioner), to provide me with telemedicine health care services (the "Services") as deemed necessary by the treating Practitioner. I acknowledge and consent to receive the Services by the Practitioner via telemedicine. I understand that the telemedicine visit will involve communicating with the Practitioner through live audiovisual communication technology and the disclosure of certain medical information by electronic transmission. I acknowledge that I have been given the opportunity to request an in-person assessment or other available alternative prior to the telemedicine visit and am voluntarily participating in the telemedicine visit.  I understand that I have the right to withhold or withdraw my consent to the use of telemedicine in the course of my care at any time, without affecting my right to future care or treatment, and that the Practitioner or I may terminate the telemedicine visit at any time. I understand that I have the right to inspect all information obtained and/or recorded in the course of the telemedicine visit and may receive copies of available information for a reasonable fee.  I understand that some of the potential risks of receiving the Services via telemedicine include:  Delay or interruption in medical evaluation due to technological equipment failure or disruption; Information transmitted may not be sufficient (e.g. poor resolution of images) to allow for appropriate medical decision making by the  Practitioner; and/or  In rare instances, security protocols could fail, causing a breach of personal health information.  Furthermore, I acknowledge that it is my responsibility to provide information about my medical history, conditions and care that is complete and accurate to the best of my ability. I acknowledge that Practitioner's advice, recommendations, and/or decision may be based on factors not within their control, such as incomplete or inaccurate data provided by me or distortions of diagnostic images or specimens that may result from electronic transmissions. I understand that the practice of medicine is not an exact science and that Practitioner makes no warranties or guarantees regarding treatment outcomes. I acknowledge that a copy of this consent can be made available to me via my patient portal [facility] MyChart), or I can request a printed copy by calling the office of [facility].    I understand that my insurance will be billed for this visit.   I have read or had this consent read to me. I understand the contents of this consent, which adequately explains the benefits and risks of the Services being provided via telemedicine.  I have been provided ample opportunity to ask questions regarding this consent and the Services and have had my questions answered to my satisfaction. I give my informed consent for the services to be provided through the use of telemedicine in my medical care

## 2024-02-10 ENCOUNTER — Other Ambulatory Visit: Payer: Self-pay | Admitting: Internal Medicine

## 2024-03-28 NOTE — Progress Notes (Signed)
 Chief Complaint  Patient presents with  . Follow-up    4 month recheck     HPI  Donna Harper is a 73 y.o. here for a Follow up Hx of type 2 DM, CAD - MI in 2020., Hx of Asthma  C/o Intermittent cough and dryness of throat  On Famotidine  for GERD symptoms  Continues to experience intermittent night cramps - worse at night time  States has nasal stuffiness and symptoms of allergies  Non smoker No alcohol Weight has been steady. No symptoms of Depression  Married- 3 children  Labs : Hgb: 11.5 Sugar 113; se Creat; 1.6 (EGFR: 34)  Total Cholesterol;122 Triglycerides;189 A1c; 6.3   B12; 183 TSH; 3.071  Se Ferritin ; 20     ROS Rest of 10 point review of systems is normal.  Outpatient Encounter Medications as of 03/28/2024  Medication Sig Dispense Refill  . albuterol MDI, PROVENTIL, VENTOLIN, PROAIR, HFA 90 mcg/actuation inhaler Inhale 2 inhalations into the lungs every 6 (six) hours as needed for Wheezing 1 each 5  . alirocumab  (PRALUENT  PEN) 75 mg/mL pen injector Inject 75 mg subcutaneously    . ascorbic acid (VITAMIN C) 500 MG tablet     . BLOOD SUGAR DIAGNOSTIC (ONETOUCH ULTRA TEST MISC)     . budesonide-formoteroL (SYMBICORT) 80-4.5 mcg/actuation inhaler Inhale 2 inhalations into the lungs 2 (two) times daily 1 g 12  . cholecalciferol (VITAMIN D3) 1000 unit tablet Take 1,000 Units by mouth once daily    . cyanocobalamin  (VITAMIN B12) 1000 MCG tablet Take 1 tablet (1,000 mcg total) by mouth once daily 30 tablet 5  . fenofibrate  54 MG tablet Take 54 mg by mouth once daily    . fish oil-dha-epa 1,200-144-216 mg Cap     . glipiZIDE  (GLUCOTROL  XL) 5 MG XL tablet TAKE 1 TABLET(5 MG) BY MOUTH DAILY 90 tablet 0  . iron polysaccharides (FERREX) 150 mg iron capsule Take 1 capsule (150 mg total) by mouth once daily 180 capsule 1  . JANUVIA  100 mg tablet TAKE 1 TABLET(100 MG) BY MOUTH DAILY 30 tablet 2  . Lactobacillus acidophilus (PROBIOTIC) 10 billion cell Cap     . losartan  (COZAAR )  50 MG tablet TAKE 1 TABLET(50 MG) BY MOUTH DAILY 30 tablet 2  . magnesium oxide (MAG-OX) 400 mg (241.3 mg magnesium) tablet TAKE 1 TABLET(400 MG) BY MOUTH DAILY 90 tablet 0  . metFORMIN  (GLUCOPHAGE ) 1000 MG tablet TAKE 1 TABLET(1000 MG) BY MOUTH TWICE DAILY WITH MEALS 180 tablet 1  . metoprolol  SUCCinate 50 mg CSpX Take 1 capsule by mouth once daily    . metoprolol  TARTrate (LOPRESSOR ) 50 MG tablet TAKE 1 TABLET(50 MG) BY MOUTH TWICE DAILY 180 tablet 0  . mometasone  (NASONEX ) 50 mcg/actuation nasal spray     . nitroGLYcerin  (NITROSTAT ) 0.4 MG SL tablet Place 0.4 mg under the tongue every 5 (five) minutes as needed    . REPATHA  SURECLICK 140 mg/mL PnIj Inject 140 mg subcutaneously every 14 (fourteen) days    . simvastatin (ZOCOR) 40 MG tablet     . vitamin E 400 UNIT capsule Take 400 Units by mouth once daily    . vitamin E 400 unit Tab Take 1 tablet by mouth every Monday, Wednesday, and Friday    . amLODIPine  (NORVASC ) 5 MG tablet Take 5 mg by mouth once daily     No facility-administered encounter medications on file as of 03/28/2024.    Allergies as of 03/28/2024 - Reviewed 03/28/2024  Allergen  Reaction Noted  . Atorvastatin Muscle Pain 08/14/2019  . Codeine sulfate Unknown 08/06/2014  . Erythromycin ethylsuccinate Unknown 08/06/2014  . Gatifloxacin Other (See Comments) 11/04/2014  . Penicillin v potassium Unknown 08/06/2014  . Rosuvastatin  Other (See Comments) 08/14/2019  . Sertraline hcl Other (See Comments) 11/04/2014  . Simvastatin Other (See Comments) 08/14/2019  . Zithromax z-pak [azithromycin] Unknown 08/06/2014  . Zoloft [sertraline] Unknown 08/06/2014    Past Medical History:  Diagnosis Date  . Depression   . Diabetes mellitus type 2, uncomplicated (CMS/HHS-HCC)   . Fibrocystic breast disease   . Hyperlipidemia   . Seasonal allergies     Past Surgical History:  Procedure Laterality Date  . APPENDECTOMY    . Breast biopsies     which the patient states are benign   . Rectal surgery for fistulas post delivery of her last child    . TONSILLECTOMY      Vitals:   03/28/24 1303  BP: 120/68  Pulse: 74    Body mass index is 26.75 kg/m.  Appointment on 03/21/2024  Component Date Value Ref Range Status  . Phosphorus 03/21/2024 3.5  2.5 - 5.0 mg/dL Final  . Parathyroid Hormone, Intact - LabC* 03/21/2024 25  15 - 65 pg/mL Final  . WBC (White Blood Cell Count) 03/21/2024 5.1  4.1 - 10.2 10^3/uL Final  . RBC (Red Blood Cell Count) 03/21/2024 3.90 (L)  4.04 - 5.48 10^6/uL Final  . Hemoglobin 03/21/2024 11.5 (L)  12.0 - 15.0 gm/dL Final  . Hematocrit 95/89/7974 35.2  35.0 - 47.0 % Final  . MCV (Mean Corpuscular Volume) 03/21/2024 90.3  80.0 - 100.0 fl Final  . MCH (Mean Corpuscular Hemoglobin) 03/21/2024 29.5  27.0 - 31.2 pg Final  . MCHC (Mean Corpuscular Hemoglobin * 03/21/2024 32.7  32.0 - 36.0 gm/dL Final  . Platelet Count 03/21/2024 193  150 - 450 10^3/uL Final  . RDW-CV (Red Cell Distribution Widt* 03/21/2024 13.8  11.6 - 14.8 % Final  . MPV (Mean Platelet Volume) 03/21/2024 10.3  9.4 - 12.4 fl Final  . Neutrophils 03/21/2024 3.10  1.50 - 7.80 10^3/uL Final  . Lymphocytes 03/21/2024 1.17  1.00 - 3.60 10^3/uL Final  . Monocytes 03/21/2024 0.43  0.00 - 1.50 10^3/uL Final  . Eosinophils 03/21/2024 0.32  0.00 - 0.55 10^3/uL Final  . Basophils 03/21/2024 0.06  0.00 - 0.09 10^3/uL Final  . Neutrophil % 03/21/2024 60.4  32.0 - 70.0 % Final  . Lymphocyte % 03/21/2024 22.8  10.0 - 50.0 % Final  . Monocyte % 03/21/2024 8.4  4.0 - 13.0 % Final  . Eosinophil % 03/21/2024 6.2 (H)  1.0 - 5.0 % Final  . Basophil% 03/21/2024 1.2  0.0 - 2.0 % Final  . Immature Granulocyte % 03/21/2024 1.0 (H)  <=0.7 % Final  . Immature Granulocyte Count 03/21/2024 0.05  <=0.06 10^3/L Final  . Glucose 03/21/2024 113 (H)  70 - 110 mg/dL Final  . Sodium 95/89/7974 143  136 - 145 mmol/L Final  . Potassium 03/21/2024 4.5  3.6 - 5.1 mmol/L Final  . Chloride 03/21/2024 108  97 -  109 mmol/L Final  . Carbon Dioxide (CO2) 03/21/2024 28.8  22.0 - 32.0 mmol/L Final  . Urea Nitrogen (BUN) 03/21/2024 23  7 - 25 mg/dL Final  . Creatinine 95/89/7974 1.6 (H)  0.6 - 1.1 mg/dL Final  . Glomerular Filtration Rate (eGFR) 03/21/2024 34 (L)  >60 mL/min/1.73sq m Final   CKD-EPI (2021) does not include patient's race in the  calculation of eGFR.  Monitoring changes of plasma creatinine and eGFR over time is useful for monitoring kidney function.   Interpretive Ranges for eGFR (CKD-EPI 2021):  eGFR:       >60 mL/min/1.73 sq. m - Normal eGFR:       30-59 mL/min/1.73 sq. m - Moderately Decreased eGFR:       15-29 mL/min/1.73 sq. m  - Severely Decreased eGFR:       < 15 mL/min/1.73 sq. m  - Kidney Failure    Note: These eGFR calculations do not apply in acute situations when eGFR is changing rapidly or patients on dialysis.  . Calcium  03/21/2024 9.8  8.7 - 10.3 mg/dL Final  . AST  95/89/7974 18  8 - 39 U/L Final  . ALT  03/21/2024 14  5 - 38 U/L Final  . Alk Phos (alkaline Phosphatase) 03/21/2024 34  34 - 104 U/L Final  . Albumin 03/21/2024 4.6  3.5 - 4.8 g/dL Final  . Bilirubin, Total 03/21/2024 0.4  0.3 - 1.2 mg/dL Final  . Protein, Total 03/21/2024 6.9  6.1 - 7.9 g/dL Final  . A/G Ratio 95/89/7974 2.0  1.0 - 5.0 gm/dL Final  . Hemoglobin J8R 03/21/2024 6.3 (H)  4.2 - 5.6 % Final  . Average Blood Glucose (Calc) 03/21/2024 134  mg/dL Final  . Cholesterol, Total 03/21/2024 122  100 - 200 mg/dL Final  . Triglyceride 95/89/7974 189  35 - 199 mg/dL Final  . HDL (High Density Lipoprotein) Cho* 03/21/2024 66.0  35.0 - 85.0 mg/dL Final  . LDL Calculated 03/21/2024 18  0 - 130 mg/dL Final  . VLDL Cholesterol 03/21/2024 38  mg/dL Final  . Cholesterol/HDL Ratio 03/21/2024 1.8   Final  . Creatinine, Random Urine 03/21/2024 47.9  37.0 - 250.0 mg/dL Final  . Urine Albumin, Random 03/21/2024 <7    mg/L Final  . Urine Albumin/Creatinine Ratio 03/21/2024 <14.6  <30.0 ug/mg Final   Urine:          Spot collection              (g/mg creatinine)     Normal               < 30   Moderately          30-299         increased   Clinical             >=300 albuminuria  . Thyroid  Stimulating Hormone (TSH) 03/21/2024 3.071  0.450-5.330 uIU/ml uIU/mL Final   Reference Range for Pregnant Females >= 18 yrs old: Normal Range for 1st trimester: 0.05-3.70 ulU/ml Normal Range for 2nd trimester: 0.31-4.35 ulU/ml  . Color 03/21/2024 Light Yellow  Colorless, Straw, Light Yellow, Yellow, Dark Yellow Final  . Clarity 03/21/2024 Clear  Clear Final  . Specific Gravity 03/21/2024 1.008  1.005 - 1.030 Final  . pH, Urine 03/21/2024 5.5  5.0 - 8.0 Final  . Protein, Urinalysis 03/21/2024 Negative  Negative mg/dL Final  . Glucose, Urinalysis 03/21/2024 Negative  Negative mg/dL Final  . Ketones, Urinalysis 03/21/2024 Negative  Negative mg/dL Final  . Blood, Urinalysis 03/21/2024 Negative  Negative Final  . Nitrite, Urinalysis 03/21/2024 Negative  Negative Final  . Leukocyte Esterase, Urinalysis 03/21/2024 2+ (!)  Negative Final  . Bilirubin, Urinalysis 03/21/2024 Negative  Negative Final  . Urobilinogen, Urinalysis 03/21/2024 0.2  0.2 - 1.0 mg/dL Final  . WBC, UA 95/89/7974 1  <=5 /hpf Final  . Red Blood Cells, Urinalysis  03/21/2024 <1  <=3 /hpf Final  . Bacteria, Urinalysis 03/21/2024 0-5  0 - 5 /hpf Final  . Squamous Epithelial Cells, Urinaly* 03/21/2024 3  /hpf Final  . Ferritin 03/21/2024 20  11 - 307 ng/mL Final  . Magnesium 03/21/2024 2.0  1.8 - 2.5 mg/dL Final    Exam Blood pressure 120/68, pulse 74, height 158.8 cm (5' 2.5), weight 67.4 kg (148 lb 9.6 oz), SpO2 96%. Wt Readings from Last 3 Encounters:  03/28/24 67.4 kg (148 lb 9.6 oz)  11/28/23 68.3 kg (150 lb 9.6 oz)  08/07/23 67.9 kg (149 lb 9.6 oz)   Body mass index is 26.75 kg/m.  General: Alert oriented x3  Skin: No suspicious lesions or moles.   Eyes: Sclera and conjunctiva clear; pupils equal round and reactive to light   extraocular movements intact Ears: External ears and canal normal; tympanic membranes normal.   Nose: Mucosa healthy without drainage or ulceration Oropharynx: No suspicious lesions Neck: No swelling, masses, stiffness, pain, limited movement, carotid pulses normal bilaterally, thyroid  normal size, no masses palpated. No bruits heard. Lungs: Respirations unlabored; CTA Back: No spinal deformity Cardiovascular: Heart regular rate and rhythm without murmurs, gallops, or rubs Abdomen: Soft; non tender; non distended;  no masses or organomegaly Pelvic : Declined  Musculoskeletal: OA small joints of hands  Extremities: Normal, no edema Neurologic: Alert and oriented; speech intact; face symmetrical; moves all extremities well     Assessment and Plan: 1Type 2 DM; On Glipizide , Metformin  and Januvia   Last A1c; 6.3  Sees Dr. Mevelyn for annual eye exam  2 Persistent cough; On Albuterol inhaler and Symbicort inhaler  Add Singulair 10 mg po q PM  3 Anemia; (Hgb; 11.5)  Low B12;and Se Ferritin  On  B12 1000 mcg po qd  On Niferex to 150 mg po twice a day  4 CKD--Stage 3 B Se Creat; 1.6 BUN; 30 (EGFR; 34)  Increase fluid intake  5 CAD - S/p MI in 2020- sees Dr. Mady  6 Leg cramps; Improved - On Magnesium  7 Health Maintenance; Mammogram; April 2024; Negative - Patient will schedule it herself  Colonoscopy: Feb 2023  One 5 mm polyp in the sigmoid colon, removed with a  cold snare. Resected and retrieved. - Diverticulosis in the sigmoid colon and in the  descending colon. - Non-bleeding internal hemorrhoids.  DEXA Scan.; Sept 2024; Normal  Labs 1 week prior to next visit  Follow up in 4 months     Tamra Leventhal  MD

## 2024-07-16 ENCOUNTER — Ambulatory Visit: Attending: Medical | Admitting: Medical

## 2024-07-16 ENCOUNTER — Encounter: Payer: Self-pay | Admitting: Medical

## 2024-07-16 VITALS — BP 150/60 | HR 81 | Ht 62.0 in | Wt 152.4 lb

## 2024-07-16 DIAGNOSIS — I2510 Atherosclerotic heart disease of native coronary artery without angina pectoris: Secondary | ICD-10-CM

## 2024-07-16 DIAGNOSIS — E782 Mixed hyperlipidemia: Secondary | ICD-10-CM

## 2024-07-16 DIAGNOSIS — I10 Essential (primary) hypertension: Secondary | ICD-10-CM

## 2024-07-16 DIAGNOSIS — R011 Cardiac murmur, unspecified: Secondary | ICD-10-CM | POA: Diagnosis not present

## 2024-07-16 MED ORDER — REPATHA SURECLICK 140 MG/ML ~~LOC~~ SOAJ: 140.0000 mg | SUBCUTANEOUS | 3 refills | Status: AC

## 2024-07-16 MED ORDER — AMLODIPINE BESYLATE 5 MG PO TABS: 5.0000 mg | ORAL_TABLET | Freq: Every day | ORAL | 3 refills | Status: AC

## 2024-07-16 NOTE — Patient Instructions (Signed)
 Medication Instructions:  Your physician recommends that you continue on your current medications as directed. Please refer to the Current Medication list given to you today.    *If you need a refill on your cardiac medications before your next appointment, please call your pharmacy*  Lab Work: No labs ordered today    Testing/Procedures: No test ordered today   Follow-Up: At [facility], you and your health needs are our priority.  As part of our continuing mission to provide you with exceptional heart care, our providers are all part of one team.  This team includes your primary Cardiologist (physician) and Advanced Practice Providers or APPs (Physician Assistants and Nurse Practitioners) who all work together to provide you with the care you need, when you need it.  Your next appointment:   6 month(s)  Provider:   Lonni Hanson, MD or Cadence Franchester, PA-C

## 2024-07-16 NOTE — Progress Notes (Signed)
 Cardiology Office Note   Date:  07/16/2024  ID:  KIORA HALLBERG, DOB 08/01/1951, MRN [id number] PCP: Sadie Manna, MD  [facility] Providers Cardiologist:  Lonni Hanson, MD    History of Present Illness Donna Harper is a 73 y.o. female with a history of CAD status post PCI to the RCA in the setting of non-STEMI in 2020, hypertension, hyperlipidemia, diabetes type 2, TIA, and GERD presents for follow-up of CAD.  Patient was last seen in January 2025 reporting leg cramps at night.  She also reported a chronic cough.  Repatha  was held for 1 month and she was referred to the lipid clinic.  She saw Dr. Mona 01/23/2024 and reported leg cramps resolved with a heating pad.  He recommended restarting Repatha .  Today, the patient is overall doing well. She has restarted Repatha . PCP thought it was from Magnesium. She said pickle juice stopped the leg cramps. She denies chest pain or shortness of breath. No lightheadedness or dizziness. Blood pressure is a little high, but she says this is abnormal. She eats low salt diet. Diet is overall OK. Patient stays active around the house.   Studies Reviewed EKG Interpretation Date/Time:  Tuesday July 16 2024 13:49:55 EDT Ventricular Rate:  81 PR Interval:  174 QRS Duration:  124 QT Interval:  412 QTC Calculation: 478 R Axis:   32  Text Interpretation: Normal sinus rhythm Right bundle branch block Cannot rule out Inferior infarct , age undetermined When compared with ECG of 04-Jan-2024 11:02, No significant change was found Confirmed by Franchester, Michail Boyte ([id number]) on 07/16/2024 1:51:56 PM    Echo 2024 INTERPRETATION  NORMAL LEFT VENTRICULAR SYSTOLIC FUNCTION  NORMAL RIGHT VENTRICULAR SYSTOLIC FUNCTION  MILD VALVULAR REGURGITATION (See above)  MILD VALVULAR STENOSIS (See above)  Mild AS/AI   MPI 11/2020 Narrative & Impression  T wave inversion was noted during stress in the V4 and V2 leads. There was no ST segment deviation noted  during stress. The study is normal. This is a low risk study. The left ventricular ejection fraction is hyperdynamic (>65%). CT attenuation images show minimal aortic and coronary calcifications.    Echo 07/2019  1. Severe hypokinesis of the left ventricular, basal inferior segment.   2. The left ventricle has normal systolic function, with an ejection  fraction of 55-60%. The cavity size was normal. There is mildly increased  left ventricular wall thickness. Left ventricular diastolic Doppler  parameters are consistent with impaired  relaxation. Elevated mean left atrial pressure.   3. The right ventricle has normal systolic function. The cavity was  normal. There is no increase in right ventricular wall thickness. Right  ventricular systolic pressure could not be assessed.   4. The aortic valve has an indeterminate number of cusps. Mild thickening  of the aortic valve.   5. The aorta is normal unless otherwise noted.   6. The interatrial septum was not well visualized.   LHC 07/2019 Conclusions: Significant two-vessel coronary artery disease with multifocal mid LAD disease of up to 60-70% and 95% mid RCA stenosis. Basal inferior hypokinesis with otherwise preserved left ventricular systolic function; LVEF 45-50%. Normal left ventricular filling pressure. Successful PCI to mid RCA using Resolute Onyx 3.0 x 12 mm DES with 0% residual stenosis and TIMI-3 flow.   Recommendations: Complete 2 hours of cangrelor  infusion. Dual antiplatelet therapy with aspirin  and ticagrelor  for at least 12 months. Aggressive secondary prevention.  Consider trial of PCSK9 inhibitor as an outpatient, given history of statin  intolerance.  Physical Exam VS:  BP (!) 150/60   Pulse 81   Ht 5' 2 (1.575 m)   Wt 152 lb 6.4 oz (69.1 kg)   SpO2 98%   BMI 27.87 kg/m        Wt Readings from Last 3 Encounters:  07/16/24 152 lb 6.4 oz (69.1 kg)  01/23/24 146 lb (66.2 kg)  01/04/24 152 lb 3.2 oz (69 kg)     GEN: Well nourished, well developed in no acute distress NECK: No JVD; No carotid bruits CARDIAC: RRR, + murmur, no rubs, gallops RESPIRATORY:  Clear to auscultation without rales, wheezing or rhonchi  ABDOMEN: Soft, non-tender, non-distended EXTREMITIES:  No edema; No deformity   ASSESSMENT AND PLAN  CAD s/p PCI RCA in 2020 Patient denies any anginal symptoms.  She stays active with housework and caring for her husband.  Continue aspirin  81 mg daily, fenofibrate , Repatha , Lopressor  50 mg twice daily.  HTN Blood pressure is mildly elevated, but patient says this is abnormal for her.  I recommended she check it at home 2 hours after morning meds.  We will continue amlodipine  5 mg daily, losartan  25 mg daily and Lopressor  50 mg twice daily.  Refill amlodipine  today.  HLD LDL at goal.  Continue Repatha , we will send in refills.  Murmur Echo from last year at [facility] showed normal pump function with mild AI/AAS.        Dispo: Follow-up in 6 months  Signed, Adesuwa Osgood VEAR Fishman, PA-C

## 2024-08-13 ENCOUNTER — Other Ambulatory Visit: Payer: Self-pay | Admitting: Internal Medicine

## 2024-08-26 ENCOUNTER — Other Ambulatory Visit: Payer: Self-pay | Admitting: Medical

## 2024-08-27 MED ORDER — LOSARTAN POTASSIUM 25 MG PO TABS: 25.0000 mg | ORAL_TABLET | Freq: Every day | ORAL | 3 refills | Status: AC

## 2025-01-22 ENCOUNTER — Ambulatory Visit: Admitting: Medical
